# Patient Record
Sex: Female | Born: 1955
Health system: Southern US, Community
[De-identification: ages and names within clinical notes are randomized; demographics above are authoritative.]

## PROBLEM LIST (undated history)

## (undated) DIAGNOSIS — M797 Fibromyalgia: Secondary | ICD-10-CM

## (undated) DIAGNOSIS — F329 Major depressive disorder, single episode, unspecified: Secondary | ICD-10-CM

## (undated) DIAGNOSIS — I10 Essential (primary) hypertension: Secondary | ICD-10-CM

## (undated) DIAGNOSIS — B009 Herpesviral infection, unspecified: Secondary | ICD-10-CM

## (undated) DIAGNOSIS — J189 Pneumonia, unspecified organism: Secondary | ICD-10-CM

## (undated) DIAGNOSIS — L719 Rosacea, unspecified: Secondary | ICD-10-CM

## (undated) DIAGNOSIS — E669 Obesity, unspecified: Secondary | ICD-10-CM

## (undated) DIAGNOSIS — L309 Dermatitis, unspecified: Secondary | ICD-10-CM

## (undated) DIAGNOSIS — M1990 Unspecified osteoarthritis, unspecified site: Secondary | ICD-10-CM

## (undated) DIAGNOSIS — Z8669 Personal history of other diseases of the nervous system and sense organs: Secondary | ICD-10-CM

## (undated) DIAGNOSIS — N3281 Overactive bladder: Secondary | ICD-10-CM

## (undated) DIAGNOSIS — N390 Urinary tract infection, site not specified: Secondary | ICD-10-CM

## (undated) DIAGNOSIS — T7840XA Allergy, unspecified, initial encounter: Secondary | ICD-10-CM

## (undated) DIAGNOSIS — E039 Hypothyroidism, unspecified: Secondary | ICD-10-CM

## (undated) DIAGNOSIS — D496 Neoplasm of unspecified behavior of brain: Secondary | ICD-10-CM

## (undated) DIAGNOSIS — S62102A Fracture of unspecified carpal bone, left wrist, initial encounter for closed fracture: Secondary | ICD-10-CM

## (undated) DIAGNOSIS — K219 Gastro-esophageal reflux disease without esophagitis: Secondary | ICD-10-CM

## (undated) DIAGNOSIS — M8580 Other specified disorders of bone density and structure, unspecified site: Secondary | ICD-10-CM

## (undated) DIAGNOSIS — M722 Plantar fascial fibromatosis: Secondary | ICD-10-CM

## (undated) DIAGNOSIS — M810 Age-related osteoporosis without current pathological fracture: Secondary | ICD-10-CM

## (undated) DIAGNOSIS — H9190 Unspecified hearing loss, unspecified ear: Secondary | ICD-10-CM

## (undated) DIAGNOSIS — B351 Tinea unguium: Secondary | ICD-10-CM

## (undated) DIAGNOSIS — F32A Depression, unspecified: Secondary | ICD-10-CM

## (undated) DIAGNOSIS — D649 Anemia, unspecified: Secondary | ICD-10-CM

## (undated) DIAGNOSIS — G4752 REM sleep behavior disorder: Secondary | ICD-10-CM

## (undated) DIAGNOSIS — F419 Anxiety disorder, unspecified: Secondary | ICD-10-CM

## (undated) DIAGNOSIS — IMO0002 Reserved for concepts with insufficient information to code with codable children: Secondary | ICD-10-CM

## (undated) DIAGNOSIS — K5900 Constipation, unspecified: Secondary | ICD-10-CM

## (undated) HISTORY — DX: Fibromyalgia: M79.7

## (undated) HISTORY — DX: Gastro-esophageal reflux disease without esophagitis: K21.9

## (undated) HISTORY — DX: Personal history of other diseases of the nervous system and sense organs: Z86.69

## (undated) HISTORY — DX: Plantar fascial fibromatosis: M72.2

## (undated) HISTORY — DX: Allergy, unspecified, initial encounter: T78.40XA

## (undated) HISTORY — DX: Age-related osteoporosis without current pathological fracture: M81.0

## (undated) HISTORY — DX: Anemia, unspecified: D64.9

## (undated) HISTORY — DX: Overactive bladder: N32.81

## (undated) HISTORY — PX: TONSILLECTOMY AND ADENOIDECTOMY: SHX28

## (undated) HISTORY — DX: Dermatitis, unspecified: L30.9

## (undated) HISTORY — PX: ABDOMINAL HYSTERECTOMY: SHX81

## (undated) HISTORY — DX: Major depressive disorder, single episode, unspecified: F32.9

## (undated) HISTORY — DX: Unspecified osteoarthritis, unspecified site: M19.90

## (undated) HISTORY — DX: Constipation, unspecified: K59.00

## (undated) HISTORY — DX: Hypothyroidism, unspecified: E03.9

## (undated) HISTORY — DX: Depression, unspecified: F32.A

## (undated) HISTORY — DX: Anxiety disorder, unspecified: F41.9

## (undated) HISTORY — DX: Essential (primary) hypertension: I10

## (undated) HISTORY — DX: Other specified disorders of bone density and structure, unspecified site: M85.80

## (undated) HISTORY — DX: Obesity, unspecified: E66.9

## (undated) HISTORY — DX: Urinary tract infection, site not specified: N39.0

## (undated) HISTORY — DX: Rosacea, unspecified: L71.9

## (undated) HISTORY — DX: REM sleep behavior disorder: G47.52

## (undated) HISTORY — PX: HAND SURGERY: SHX662

## (undated) HISTORY — PX: SPINE SURGERY: SHX786

---

## 2000-04-03 ENCOUNTER — Encounter: Admission: RE | Admit: 2000-04-03 | Discharge: 2000-04-03 | Payer: Self-pay | Admitting: Family Medicine

## 2000-04-03 ENCOUNTER — Encounter: Payer: Self-pay | Admitting: Family Medicine

## 2000-11-26 ENCOUNTER — Other Ambulatory Visit: Admission: RE | Admit: 2000-11-26 | Discharge: 2000-11-26 | Payer: Self-pay | Admitting: Family Medicine

## 2001-11-15 ENCOUNTER — Encounter: Admission: RE | Admit: 2001-11-15 | Discharge: 2001-11-15 | Payer: Self-pay | Admitting: Family Medicine

## 2001-11-15 ENCOUNTER — Encounter: Payer: Self-pay | Admitting: Family Medicine

## 2002-07-21 ENCOUNTER — Other Ambulatory Visit: Admission: RE | Admit: 2002-07-21 | Discharge: 2002-07-21 | Payer: Self-pay | Admitting: Family Medicine

## 2003-04-03 ENCOUNTER — Ambulatory Visit (HOSPITAL_COMMUNITY): Admission: RE | Admit: 2003-04-03 | Discharge: 2003-04-03 | Payer: Self-pay | Admitting: Neurosurgery

## 2004-05-02 ENCOUNTER — Ambulatory Visit (HOSPITAL_COMMUNITY): Admission: RE | Admit: 2004-05-02 | Discharge: 2004-05-02 | Payer: Self-pay | Admitting: Family Medicine

## 2004-08-10 ENCOUNTER — Other Ambulatory Visit: Admission: RE | Admit: 2004-08-10 | Discharge: 2004-08-10 | Payer: Self-pay | Admitting: Family Medicine

## 2006-02-16 ENCOUNTER — Encounter: Admission: RE | Admit: 2006-02-16 | Discharge: 2006-02-16 | Payer: Self-pay | Admitting: Rheumatology

## 2006-02-19 ENCOUNTER — Ambulatory Visit (HOSPITAL_COMMUNITY): Admission: RE | Admit: 2006-02-19 | Discharge: 2006-02-19 | Payer: Self-pay | Admitting: Family Medicine

## 2007-01-29 ENCOUNTER — Encounter: Admission: RE | Admit: 2007-01-29 | Discharge: 2007-01-29 | Payer: Self-pay | Admitting: Neurosurgery

## 2007-02-04 ENCOUNTER — Encounter: Admission: RE | Admit: 2007-02-04 | Discharge: 2007-02-04 | Payer: Self-pay | Admitting: Neurosurgery

## 2007-02-07 ENCOUNTER — Encounter: Admission: RE | Admit: 2007-02-07 | Discharge: 2007-02-07 | Payer: Self-pay | Admitting: Neurosurgery

## 2007-05-30 ENCOUNTER — Ambulatory Visit (HOSPITAL_COMMUNITY): Admission: RE | Admit: 2007-05-30 | Discharge: 2007-05-30 | Payer: Self-pay | Admitting: Family Medicine

## 2007-07-01 ENCOUNTER — Ambulatory Visit (HOSPITAL_COMMUNITY): Admission: RE | Admit: 2007-07-01 | Discharge: 2007-07-01 | Payer: Self-pay | Admitting: Family Medicine

## 2008-02-16 ENCOUNTER — Encounter: Admission: RE | Admit: 2008-02-16 | Discharge: 2008-02-16 | Payer: Self-pay | Admitting: Neurosurgery

## 2008-11-27 ENCOUNTER — Encounter: Admission: RE | Admit: 2008-11-27 | Discharge: 2008-11-27 | Payer: Self-pay | Admitting: Family Medicine

## 2010-05-07 ENCOUNTER — Encounter: Payer: Self-pay | Admitting: Family Medicine

## 2010-05-08 ENCOUNTER — Encounter: Payer: Self-pay | Admitting: Family Medicine

## 2010-05-08 ENCOUNTER — Encounter: Payer: Self-pay | Admitting: Neurosurgery

## 2010-05-09 ENCOUNTER — Encounter: Payer: Self-pay | Admitting: Family Medicine

## 2010-05-21 ENCOUNTER — Encounter: Payer: Self-pay | Admitting: Family Medicine

## 2010-06-27 ENCOUNTER — Other Ambulatory Visit: Payer: Self-pay | Admitting: Family Medicine

## 2010-06-27 DIAGNOSIS — Z1231 Encounter for screening mammogram for malignant neoplasm of breast: Secondary | ICD-10-CM

## 2010-07-01 ENCOUNTER — Ambulatory Visit: Payer: Self-pay

## 2010-07-07 ENCOUNTER — Ambulatory Visit: Payer: Self-pay

## 2010-09-02 NOTE — Op Note (Signed)
NAME:  Emily Steele, Emily Steele                          ACCOUNT NO.:  [id number]   MEDICAL RECORD NO.:  [id number]                   PATIENT TYPE:  OIB   LOCATION:  2899                                 FACILITY:  [facility]   PHYSICIAN:  Donalee Citrin, M.D.                     DATE OF BIRTH:  1955-05-04   DATE OF PROCEDURE:  04/03/2003  DATE OF DISCHARGE:                                 OPERATIVE REPORT   PREOPERATIVE DIAGNOSIS:  Left S1 radiculopathy from ruptured disc L5-S1,  left.   POSTOPERATIVE DIAGNOSIS:  Left S1 radiculopathy from ruptured disc L5-S1,  left.   PROCEDURE:  Lumbar laminectomy and microdiscectomy, L5-S1 left with  microscopic dissection of the left S1 nerve root.   SURGEON:  Donalee Citrin, M.D.   ASSISTANT:  Tia Alert, MD   ANESTHESIA:  General endotracheal anesthesia.   HISTORY OF PRESENT ILLNESS:  The patient is a very pleasant 55 year old  female who has had long-standing back and left leg pain refractory to  conservative treatment that radiates down to the outside of her foot and the  bottom of her foot.  Preoperative imaging showed a preforaminal disc rupture  compressing the left S1 nerve root.  Due to the patient's failure of  conservative treatment, the patient is recommended laminectomy and  microdiscectomy.  I discussed the risks and benefits of the surgery with  her, she understood and agreed to proceed forth.   DESCRIPTION OF PROCEDURE:  The patient was brought to the operating room,  given general anesthesia, positioned prone on the Wilson frame.  The back  was prepped and draped in the usual sterile fashion.  After a preop x-ray  localized the L4 spinous process, a midline incision was made just inferior  to this after infiltration with 10 mL of lidocaine with epinephrine.  Bovie  electrocautery was used to dissect through the subcutaneous tissue and  subperiosteal dissection was carried out at the lamina of L5 and S1 on the  left side.  Interoperative  x-ray confirmed localization of the L5-S1 disc  space.  Using high speed drill, the medial aspect of the spinous process,  inferior aspect of the laminar complex, and medial aspect of the facet  complex, was drilled down and then using Kerrison punch, the inferior aspect  of the lamina of L5 and the superior aspect of the lamina of S1 exposing the  thecal sac and ligamentum flavum.  The ligamentum flavum was removed in a  piecemeal fashion and then the operating microscope was draped and brought  on the field.  The S1 nerve root was identified and the S1 foramen was  opened up with the 3 and 4 mm Kerrison punch.  Then, using a #4 Penfield,  the S1 nerve root was dissected off a bulbous disc fragment that was coming  off the superolateral aspect of the disc space and the S1  nerve root was  reflected medially.  Annulotomy was made and several fragments of disc were  removed that had ruptured but were still contained within the ligament just  overlying the L5 inferior endplate and the lateral compartment of the disc  space.  All these were teased down and the disc was cleaned out.  There was  no further stenosis appreciated on the S1 nerve root as well as the coronary  dilator were able to be passed out the L5 neural foramen.  Using a #4  Penfield, coronary dilator, and a hockey stick, no further stenosis was  appreciated either on the central canal along the thecal sac, the S1 nerve  root along its pathway out the foramen, or the lateral compartment.  Meticulous hemostasis was maintained.  Gelfoam was overlaid on top of the  dura.  The muscle and fascia were reapproximated with 0 interrupted Vicryl,  the  subcutaneous tissue was closed with 2-0 interrupted Vicryl, and skin was  closed with running 4-0 subcuticular.  Benzoin and Steri-Strips were  applied.  The patient went to the recovery room in stable condition.  At the  end of the case, the needle, sponge, and instrument counts were  correct.                                               Donalee Citrin, M.D.    GC/MEDQ  D:  04/03/2003  T:  04/03/2003  Job:  [id number]

## 2010-09-02 NOTE — Op Note (Signed)
NAME:  Emily Steele, Emily Steele                          ACCOUNT NO.:  [id number]   MEDICAL RECORD NO.:  [id number]                   PATIENT TYPE:  OIB   LOCATION:  NA                                   FACILITY:  [facility]   PHYSICIAN:  Donalee Citrin, M.D.                     DATE OF BIRTH:  March 05, 1956   DATE OF PROCEDURE:  04/03/2003  DATE OF DISCHARGE:                                 OPERATIVE REPORT   No dictation for this job number.                                               Donalee Citrin, M.D.    GC/MEDQ  D:  04/03/2003  T:  04/03/2003  Job:  [id number]

## 2011-03-22 ENCOUNTER — Ambulatory Visit (INDEPENDENT_AMBULATORY_CARE_PROVIDER_SITE_OTHER): Payer: PRIVATE HEALTH INSURANCE | Admitting: Family Medicine

## 2011-03-22 DIAGNOSIS — F329 Major depressive disorder, single episode, unspecified: Secondary | ICD-10-CM

## 2011-03-22 DIAGNOSIS — IMO0001 Reserved for inherently not codable concepts without codable children: Secondary | ICD-10-CM

## 2011-03-22 DIAGNOSIS — G894 Chronic pain syndrome: Secondary | ICD-10-CM

## 2011-05-04 ENCOUNTER — Ambulatory Visit: Payer: PRIVATE HEALTH INSURANCE | Admitting: Family Medicine

## 2011-05-05 ENCOUNTER — Ambulatory Visit: Payer: PRIVATE HEALTH INSURANCE | Admitting: Family Medicine

## 2011-05-11 ENCOUNTER — Ambulatory Visit (INDEPENDENT_AMBULATORY_CARE_PROVIDER_SITE_OTHER): Payer: PRIVATE HEALTH INSURANCE | Admitting: Family Medicine

## 2011-05-11 ENCOUNTER — Ambulatory Visit: Payer: PRIVATE HEALTH INSURANCE | Admitting: Family Medicine

## 2011-05-11 DIAGNOSIS — IMO0001 Reserved for inherently not codable concepts without codable children: Secondary | ICD-10-CM

## 2011-05-11 DIAGNOSIS — F341 Dysthymic disorder: Secondary | ICD-10-CM

## 2011-05-17 ENCOUNTER — Encounter: Payer: Self-pay | Admitting: Family Medicine

## 2011-05-17 DIAGNOSIS — M797 Fibromyalgia: Secondary | ICD-10-CM

## 2011-05-17 DIAGNOSIS — F329 Major depressive disorder, single episode, unspecified: Secondary | ICD-10-CM

## 2011-05-17 DIAGNOSIS — F32A Depression, unspecified: Secondary | ICD-10-CM

## 2011-05-17 DIAGNOSIS — M8580 Other specified disorders of bone density and structure, unspecified site: Secondary | ICD-10-CM

## 2011-05-17 DIAGNOSIS — E039 Hypothyroidism, unspecified: Secondary | ICD-10-CM

## 2011-05-17 DIAGNOSIS — E669 Obesity, unspecified: Secondary | ICD-10-CM

## 2011-05-17 DIAGNOSIS — M722 Plantar fascial fibromatosis: Secondary | ICD-10-CM

## 2011-05-17 DIAGNOSIS — G4752 REM sleep behavior disorder: Secondary | ICD-10-CM

## 2011-07-06 ENCOUNTER — Ambulatory Visit: Payer: PRIVATE HEALTH INSURANCE | Admitting: Family Medicine

## 2011-07-20 ENCOUNTER — Ambulatory Visit: Payer: PRIVATE HEALTH INSURANCE | Admitting: Family Medicine

## 2011-07-27 ENCOUNTER — Ambulatory Visit: Payer: PRIVATE HEALTH INSURANCE | Admitting: Family Medicine

## 2011-08-07 ENCOUNTER — Other Ambulatory Visit: Payer: Self-pay | Admitting: Family Medicine

## 2011-08-07 DIAGNOSIS — Z1231 Encounter for screening mammogram for malignant neoplasm of breast: Secondary | ICD-10-CM

## 2011-08-08 ENCOUNTER — Ambulatory Visit (INDEPENDENT_AMBULATORY_CARE_PROVIDER_SITE_OTHER): Payer: PRIVATE HEALTH INSURANCE | Admitting: Family Medicine

## 2011-08-08 ENCOUNTER — Encounter: Payer: Self-pay | Admitting: Family Medicine

## 2011-08-08 VITALS — BP 146/99 | HR 70 | Temp 97.9°F | Resp 16 | Ht 59.5 in | Wt 196.0 lb

## 2011-08-08 DIAGNOSIS — Z8639 Personal history of other endocrine, nutritional and metabolic disease: Secondary | ICD-10-CM

## 2011-08-08 DIAGNOSIS — IMO0002 Reserved for concepts with insufficient information to code with codable children: Secondary | ICD-10-CM

## 2011-08-08 DIAGNOSIS — Z0000 Encounter for general adult medical examination without abnormal findings: Secondary | ICD-10-CM

## 2011-08-08 DIAGNOSIS — E559 Vitamin D deficiency, unspecified: Secondary | ICD-10-CM

## 2011-08-08 DIAGNOSIS — E039 Hypothyroidism, unspecified: Secondary | ICD-10-CM

## 2011-08-08 DIAGNOSIS — Z01419 Encounter for gynecological examination (general) (routine) without abnormal findings: Secondary | ICD-10-CM

## 2011-08-08 LAB — POCT URINALYSIS DIPSTICK
Bilirubin, UA: NEGATIVE
Blood, UA: NEGATIVE
Glucose, UA: NEGATIVE
Ketones, UA: NEGATIVE
Leukocytes, UA: NEGATIVE
Nitrite, UA: NEGATIVE
Protein, UA: NEGATIVE
Spec Grav, UA: >=1.03
Urobilinogen, UA: 0.2
pH, UA: 5.5

## 2011-08-08 LAB — BASIC METABOLIC PANEL WITH GFR
BUN: 10 mg/dL (ref 6–23)
CO2: 25 meq/L (ref 19–32)
Calcium: 9.4 mg/dL (ref 8.4–10.5)
Chloride: 106 meq/L (ref 96–112)
Creat: 0.71 mg/dL (ref 0.50–1.10)
Glucose, Bld: 86 mg/dL (ref 70–99)
Potassium: 4.3 meq/L (ref 3.5–5.3)
Sodium: 140 meq/L (ref 135–145)

## 2011-08-08 LAB — IFOBT (OCCULT BLOOD): IFOBT: NEGATIVE

## 2011-08-08 MED ORDER — CLINDAMYCIN PHOSPHATE 1 % EX GEL: 1.0000 "application " | CUTANEOUS | Status: DC | PRN

## 2011-08-08 MED ORDER — TRETINOIN 0.025 % EX CREA: 45.0000 g | TOPICAL_CREAM | CUTANEOUS | Status: DC | PRN

## 2011-08-08 NOTE — Patient Instructions (Signed)

## 2011-08-10 ENCOUNTER — Telehealth: Payer: Self-pay

## 2011-08-10 LAB — PAP IG (IMAGE GUIDED)

## 2011-08-10 MED ORDER — TRETINOIN 0.05 % EX CREA: TOPICAL_CREAM | Freq: Every day | CUTANEOUS | Status: AC

## 2011-08-10 NOTE — Telephone Encounter (Signed)
Can we change this prescription?

## 2011-08-10 NOTE — Telephone Encounter (Signed)
Pt notified. Dr. Audria Nine, can you please review pt's labs. Thanks

## 2011-08-10 NOTE — Telephone Encounter (Signed)
.  umfc The patient called stating that Dr. Audria Nine had called in the wrong strength of her Retin A- patient is requesting .05% instead of .025%.  Please call patient at [phone]. The patient's preferred pharmacy is [facility] on [address].

## 2011-08-11 NOTE — Progress Notes (Signed)
Quick Note:  Please pt advise that all labs are normal and PAP is negative/normal.  Provide copy of labs and PAP to pt. ______

## 2011-08-12 NOTE — Telephone Encounter (Signed)
I reviewed the medications that I reordered and Retin-A is .05% cream as she requested.

## 2011-08-12 NOTE — Telephone Encounter (Signed)
Called patient, n/a.  Cell number has been disconnected.

## 2011-08-13 ENCOUNTER — Encounter: Payer: Self-pay | Admitting: Family Medicine

## 2011-08-13 DIAGNOSIS — IMO0002 Reserved for concepts with insufficient information to code with codable children: Secondary | ICD-10-CM | POA: Insufficient documentation

## 2011-08-13 DIAGNOSIS — Z8639 Personal history of other endocrine, nutritional and metabolic disease: Secondary | ICD-10-CM | POA: Insufficient documentation

## 2011-08-13 NOTE — Progress Notes (Signed)
Subjective:    Patient ID: Emily Steele, female    DOB: Jun 24, 1955, 56 y.o.   MRN: [id number]  HPI This 56 y.o. Cauc female is here for CPE/PAP. There has been no significant change in health since  last visit; medications are the same without adverse effects. She is single with no children; she is  college- educated but currently not working, having recently be awarded Disability/SSI.   Colonoscopy : ~ 6 years ago  Eye exam: 01/ 2013  Dental exam: 04/2010  Last PAP: 2006 (normal)- S/P TAH  Review of Systems  Constitutional: Positive for unexpected weight change. Negative for fever, chills and appetite change.       Temperature control problems due to thyroid disease  HENT: Negative.   Eyes: Negative.   Respiratory: Negative.   Cardiovascular: Negative.        BP earlier today= 126/82  Gastrointestinal: Positive for constipation. Negative for nausea, diarrhea, blood in stool, anal bleeding and rectal pain.  Genitourinary: Negative.   Musculoskeletal: Positive for back pain and arthralgias. Negative for joint swelling.  Neurological: Positive for tremors. Negative for dizziness, syncope, weakness, numbness and headaches.  Hematological: Negative.   Psychiatric/Behavioral:       Currently sees Psychiatrist       Objective:   Physical Exam  Nursing note and vitals reviewed. Constitutional: She is oriented to person, place, and time. She appears well-developed and well-nourished. No distress.  HENT:  Head: Normocephalic and atraumatic.  Right Ear: External ear normal.  Left Ear: External ear normal.  Nose: Nose normal.  Mouth/Throat: Oropharynx is clear and moist.  Eyes: Conjunctivae and EOM are normal. Pupils are equal, round, and reactive to light. No scleral icterus.  Neck: Normal range of motion. Neck supple. No JVD present. No thyromegaly present.  Cardiovascular: Normal rate, regular rhythm, normal heart sounds and intact distal pulses.  Exam reveals no gallop.   No  murmur heard. Pulmonary/Chest: Effort normal. No respiratory distress. She has no wheezes. She has no rales. Right breast exhibits no inverted nipple, no mass, no nipple discharge, no skin change and no tenderness. Left breast exhibits no inverted nipple, no mass, no nipple discharge, no skin change and no tenderness. Breasts are symmetrical.  Abdominal: Soft. Bowel sounds are normal. She exhibits no distension and no mass. There is tenderness. There is no rebound and no guarding.  Genitourinary: Rectum normal and vagina normal. Rectal exam shows no external hemorrhoid, no fissure, no mass, no tenderness and anal tone normal. Guaiac negative stool. There is no rash, tenderness or lesion on the right labia. There is no rash, tenderness or lesion on the left labia. Right adnexum displays no mass, no tenderness and no fullness. Left adnexum displays no mass, no tenderness and no fullness. No erythema or tenderness around the vagina. No vaginal discharge found.  Musculoskeletal:       Mild decreased ROM in  Joints (hands, wrists, knees and ankles) with mild redness, swelling and tenderness. Decreased flexibility in spine.  Lymphadenopathy:    She has no cervical adenopathy.       Right: No inguinal adenopathy present.       Left: No inguinal adenopathy present.  Neurological: She is alert and oriented to person, place, and time. She has normal reflexes. No cranial nerve deficit. She exhibits normal muscle tone. Coordination normal.  Skin: Skin is warm and dry.  Psychiatric: She has a normal mood and affect. Her behavior is normal. Judgment and thought content normal.  Assessment & Plan:   1. Routine general medical examination at a health care facility   Basic metabolic panel  2. Hypothyroidism  TSH, T4, Free Continue current medication  3. Unspecified vitamin D deficiency  Vitamin D, 25-hydroxy  4. Encounter for cervical Pap smear with pelvic exam  Pap IG (Image Guided)   Refilled  Topical medications used for Rosacea

## 2011-08-14 NOTE — Telephone Encounter (Signed)
LMOM on H# that I was returning her call and that I believe everything has been taken care of. Asked for CB if she still has any ?s/problems

## 2011-08-17 ENCOUNTER — Ambulatory Visit: Payer: Self-pay

## 2011-08-17 ENCOUNTER — Encounter: Payer: Self-pay | Admitting: Family Medicine

## 2011-08-23 ENCOUNTER — Telehealth: Payer: Self-pay

## 2011-08-23 NOTE — Telephone Encounter (Signed)
Pt states dr Audria Nine failed to call her vitamin d into pharmacy   Please call pt at [phone] with explanation

## 2011-08-24 NOTE — Telephone Encounter (Signed)
Level was normal. Patient can take OTC vitamin D

## 2011-08-24 NOTE — Telephone Encounter (Signed)
Pt.notified

## 2011-08-29 ENCOUNTER — Ambulatory Visit: Payer: Self-pay

## 2011-09-05 ENCOUNTER — Telehealth: Payer: Self-pay | Admitting: *Deleted

## 2011-09-05 MED ORDER — CLINDAMYCIN PHOSPHATE 1 % EX SOLN: Freq: Two times a day (BID) | CUTANEOUS | Status: DC

## 2011-09-05 MED ORDER — LEVOTHYROXINE SODIUM 50 MCG PO TABS: 50.0000 ug | ORAL_TABLET | Freq: Every day | ORAL | Status: DC

## 2011-09-05 NOTE — Telephone Encounter (Signed)
Prescriptions sent to pharmacy

## 2011-09-05 NOTE — Telephone Encounter (Signed)
Pt states that she needs the clindamyacin solution and not the gel.  She also needs a refill on her levothyroxine.  Her pharmacy stated that her pharmacy has sent over request.  Can we change the gel to the solution and fill her levothyroxine?  She is almost out of both

## 2011-09-06 NOTE — Telephone Encounter (Signed)
Told pt her rx's have been sent in.

## 2011-09-15 ENCOUNTER — Other Ambulatory Visit: Payer: Self-pay | Admitting: Family Medicine

## 2011-09-15 MED ORDER — RANITIDINE HCL 150 MG PO CAPS: 150.0000 mg | ORAL_CAPSULE | Freq: Two times a day (BID) | ORAL | Status: DC

## 2011-09-28 ENCOUNTER — Ambulatory Visit: Payer: Self-pay

## 2011-12-19 ENCOUNTER — Other Ambulatory Visit: Payer: Self-pay | Admitting: Family Medicine

## 2011-12-25 ENCOUNTER — Telehealth: Payer: Self-pay

## 2011-12-25 MED ORDER — AMPICILLIN 500 MG PO CAPS: 500.0000 mg | ORAL_CAPSULE | Freq: Two times a day (BID) | ORAL | Status: DC

## 2011-12-25 NOTE — Telephone Encounter (Signed)
I will authorize this medication Ampicillin 500 mg  #60 with 1 RF. Please call it to [facility] number provided and let pt know it has been refilled.

## 2011-12-25 NOTE — Telephone Encounter (Signed)
The patient called again regarding her Ampicillan rx.  The patient stated she doesn't understand why her rx was denied and she wants someone to return her call asap at [phone].

## 2011-12-25 NOTE — Telephone Encounter (Signed)
Rx sent in and patient notified.  

## 2011-12-25 NOTE — Telephone Encounter (Signed)
I have called her and she takes this for Rosacea, she has requested Rx and states Dr Audria Nine advised her at office visit she will renew it when needed, had previously gotten from a dermatology clinic. Please advise if you will renew her Ampicillin Rx. She wants it sent to [facility]. She did not indicate which [facility], and our call was disconnected, will you authorize this? When I call her back to advise I will get pharmacy information.

## 2011-12-25 NOTE — Telephone Encounter (Signed)
patient upset this medication was denied, I am unclear why she takes this was entered as historical medication when she was here for office visit with Dr Audria Nine.

## 2011-12-25 NOTE — Telephone Encounter (Signed)
Pt states she is in [place] taking care of mother with cancer and cannot come in for an OV for med refills.  She is out of ampicillin (PRINCIPEN) 500 MG capsule Requesting we refill this script to the [facility] [phone] Uncooperative - demanding Pt is upset that we did not fill this request last Tuesday.  She is completely out of her meds.  [phone]

## 2012-02-29 ENCOUNTER — Ambulatory Visit (INDEPENDENT_AMBULATORY_CARE_PROVIDER_SITE_OTHER): Payer: PRIVATE HEALTH INSURANCE | Admitting: Family Medicine

## 2012-02-29 ENCOUNTER — Encounter: Payer: Self-pay | Admitting: Family Medicine

## 2012-02-29 VITALS — BP 133/86 | HR 74 | Temp 97.9°F | Resp 16 | Ht 60.0 in | Wt 202.0 lb

## 2012-02-29 DIAGNOSIS — Z23 Encounter for immunization: Secondary | ICD-10-CM

## 2012-02-29 DIAGNOSIS — Z760 Encounter for issue of repeat prescription: Secondary | ICD-10-CM

## 2012-02-29 DIAGNOSIS — M1990 Unspecified osteoarthritis, unspecified site: Secondary | ICD-10-CM

## 2012-02-29 DIAGNOSIS — E039 Hypothyroidism, unspecified: Secondary | ICD-10-CM

## 2012-02-29 LAB — CBC WITH DIFFERENTIAL/PLATELET
Basophils Absolute: 0 10*3/uL (ref 0.0–0.1)
Basophils Relative: 1 % (ref 0–1)
Eosinophils Relative: 5 % (ref 0–5)
HCT: 41.5 % (ref 36.0–46.0)
Lymphocytes Relative: 44 % (ref 12–46)
MCHC: 33.3 g/dL (ref 30.0–36.0)
MCV: 81.7 fL (ref 78.0–100.0)
Monocytes Absolute: 0.3 10*3/uL (ref 0.1–1.0)
Platelets: 197 10*3/uL (ref 150–400)
RDW: 14.7 % (ref 11.5–15.5)
WBC: 2.7 10*3/uL — ABNORMAL LOW (ref 4.0–10.5)

## 2012-02-29 MED ORDER — ERGOCALCIFEROL 1.25 MG (50000 UT) PO CAPS: 50000.0000 [IU] | ORAL_CAPSULE | ORAL | Status: DC

## 2012-02-29 MED ORDER — AMPICILLIN 500 MG PO CAPS: 500.0000 mg | ORAL_CAPSULE | Freq: Two times a day (BID) | ORAL | Status: DC

## 2012-02-29 MED ORDER — LEVOTHYROXINE SODIUM 50 MCG PO TABS: 50.0000 ug | ORAL_TABLET | Freq: Every day | ORAL | Status: DC

## 2012-02-29 MED ORDER — RANITIDINE HCL 150 MG PO CAPS: 150.0000 mg | ORAL_CAPSULE | Freq: Two times a day (BID) | ORAL | Status: DC

## 2012-02-29 MED ORDER — CLINDAMYCIN PHOSPHATE 1 % EX SOLN: Freq: Two times a day (BID) | CUTANEOUS | Status: DC

## 2012-02-29 NOTE — Progress Notes (Signed)
S;  This 56 y.o. Cauc female is here for medication refills. She will be going to [place] for several months to care for her terminally ill mother (diagnosed with lung cancer 2 years ago). Pt needs a few months of refills for local pharmacy and 6 months of medications routed to her pharmacy in [place]. She is stable with medical conditions. She will be seeing her psychiatrist for follow-up and med refills as well as a scheduled visit with Dr. Melvenia Needles (Fibromyalgia).  ROS: Noncontributory  O: Filed Vitals:   02/29/12 1023  BP: 133/86  Pulse: 74  Temp: 97.9 F (36.6 C)  Resp: 16   GEN: In NAD; WN,WD. HEENT: [place]/AT; EOMI w/ clear conj/scl; otherwise unremarkable. COR: RRR LUNGS: Normal resp rate and effort. NEURO: A&O x 3; CNs intact. Mentation- Flat affect, otherwise unremarkable.  A/P: 1. Unspecified hypothyroidism  TSH, Comprehensive metabolic panel  2. Degenerative joint disease  CBC with Differential, Comprehensive metabolic panel  3. Issue of repeat prescriptions    4. Need for influenza vaccination  Flu vaccine greater than or equal to 3yo preservative free IM   RX: Zostavax given to pt; she is advised to get vaccinated before going to stay with her mother.  Lab reults need to be faxed to Dr. Melvenia Needles.

## 2012-02-29 NOTE — Patient Instructions (Signed)
You have a RX: Zostavax vaccine; advise the pharmacy that you received the Flu vaccine today. I have refilled requested medications for 6 months at the pharmacy in [place]. We will fax your lab resullts to Dr. Melvenia Needles.

## 2012-03-01 ENCOUNTER — Telehealth: Payer: Self-pay

## 2012-03-01 LAB — COMPREHENSIVE METABOLIC PANEL
ALT: 21 U/L (ref 0–35)
AST: 23 U/L (ref 0–37)
Alkaline Phosphatase: 88 U/L (ref 39–117)
BUN: 14 mg/dL (ref 6–23)
Chloride: 106 mEq/L (ref 96–112)
Creat: 0.69 mg/dL (ref 0.50–1.10)
Total Bilirubin: 0.4 mg/dL (ref 0.3–1.2)

## 2012-03-01 LAB — TSH: TSH: 1.947 u[IU]/mL (ref 0.350–4.500)

## 2012-03-01 NOTE — Telephone Encounter (Signed)
Called patient, labs normal except white counts low, should be discussed with Dr Corliss Skains.

## 2012-03-01 NOTE — Progress Notes (Signed)
Quick Note:  Please call pt and advise that the following labs are abnormal... Labs look good except White blood cell counts are below normal; discuss this with Dr. Corliss Skains ( Fax #: [phone]).  Your results will be faxed to her office.  Copy to pt. ______

## 2012-03-01 NOTE — Telephone Encounter (Signed)
Pt returning lab phone call. [phone]

## 2012-03-25 ENCOUNTER — Telehealth: Payer: Self-pay

## 2012-03-25 NOTE — Telephone Encounter (Signed)
Have pended the Rx. Patient has low white count, do you still want her to have the vaccine, or discuss with Rheumatology? Or d/c this, please let me know. Freddy Kinne

## 2012-03-25 NOTE — Telephone Encounter (Signed)
Advise pt to discuss getting the Shingles vaccine with Rheumatology (Dr. Corliss Skains).

## 2012-03-25 NOTE — Telephone Encounter (Signed)
PT STATES SHE WAS GIVEN THE SHINGLES VACCINE FROM DR [facility] AND HAVE LOST IT. PLEASE CALL [phone]

## 2012-03-26 NOTE — Telephone Encounter (Signed)
Thanks, I have removed the Rx and called patient and she is advised to wait until she has seen the Rheumatologist. She will call back after she has seen Dr Link Snuffer, She is asking about her FMLA forms, have they been completed?

## 2012-04-15 ENCOUNTER — Telehealth: Payer: Self-pay

## 2012-04-15 NOTE — Telephone Encounter (Signed)
Patient also states she has been taking 2/day instead of 1/day as rx'ed.

## 2012-04-15 NOTE — Telephone Encounter (Signed)
Patient is requesting that we call in an rx for lidocane patches. Pt is in [place] and is having severe back pain. [phone] Patient: [phone]

## 2012-04-16 NOTE — Telephone Encounter (Signed)
She states she was originally given these by Dr Wynetta Emery wants to know if we can send these in for her, states today is the last day for her insurance coverage.

## 2012-04-16 NOTE — Telephone Encounter (Signed)
I have called patient and left message for her to call me back. Who originally gave the lidocaine patch? I don't know if it is safe to use 2 patches instead of 1. I have looked at this, and it is safe to use 2 patches, however she can not wear them longer than 12 hours. I will advise when she calls back.

## 2012-04-16 NOTE — Telephone Encounter (Signed)
We have never prescribed these for her.  She needs to get them from Dr. Wynetta Emery, or whoever manages her pain

## 2012-04-17 NOTE — Telephone Encounter (Signed)
LMOM to CB. 

## 2012-04-17 NOTE — Telephone Encounter (Signed)
Advise pt that I do not feel comfortable prescribing Lidoderm patches as we had not discussed my prescribing this medication and I have not fully evaluated back pain; she had been under the care of Dr. Corliss Skains for chronic pain due to fibromyalgia. Who was prescribing this medication if she had not seen Dr. Wynetta Emery since 2006? I fel comfortable with Gabapentin dose increase as well as Skelaxin be resumed if she is not taking that medication.

## 2012-04-17 NOTE — Telephone Encounter (Signed)
Called patient to advise. She has not seen him since 2006 when she had her surgery. She states Dr Audria Nine has stated she will write these now. Please advise if okay (patient has been advised she can not wear these longer than 12 hours. )

## 2012-04-22 NOTE — Telephone Encounter (Signed)
I have called patient to advise.  

## 2012-06-26 ENCOUNTER — Telehealth: Payer: Self-pay

## 2012-06-26 MED ORDER — LEVOTHYROXINE SODIUM 50 MCG PO TABS: 50.0000 ug | ORAL_TABLET | Freq: Every day | ORAL | Status: DC

## 2012-06-26 NOTE — Telephone Encounter (Signed)
Rx sent to pharmacy   

## 2012-06-26 NOTE — Telephone Encounter (Signed)
Primemail request refill on Levothyroxine 50 mcg, 90 day supply.

## 2012-06-28 ENCOUNTER — Other Ambulatory Visit: Payer: Self-pay | Admitting: *Deleted

## 2012-06-28 MED ORDER — RANITIDINE HCL 150 MG PO CAPS: 150.0000 mg | ORAL_CAPSULE | Freq: Two times a day (BID) | ORAL | Status: DC

## 2012-06-28 MED ORDER — AMPICILLIN 500 MG PO CAPS: 500.0000 mg | ORAL_CAPSULE | Freq: Two times a day (BID) | ORAL | Status: DC

## 2012-07-31 ENCOUNTER — Telehealth: Payer: Self-pay

## 2012-07-31 NOTE — Telephone Encounter (Signed)
When do you want her to follow up? °

## 2012-07-31 NOTE — Telephone Encounter (Signed)
PATIENT WANTS TO KNOW WHEN SHE IF SHE IS DUE FOR BLOODWORK,LABS,ETC. PATIENT OF DR MCPHERSON. [phone].

## 2012-08-01 ENCOUNTER — Telehealth: Payer: Self-pay

## 2012-08-01 NOTE — Telephone Encounter (Signed)
Thanks, I have advised her.  

## 2012-08-01 NOTE — Telephone Encounter (Signed)
Patient needs appt with Dr Audria Nine in the next month office visit recheck/ labs

## 2012-08-01 NOTE — Telephone Encounter (Signed)
Pt is calling back because her call got disconnected and she is wanting to know if she needs to come in for blood work.

## 2012-08-01 NOTE — Telephone Encounter (Signed)
Pt probably needs to schedule visit within next month.

## 2012-08-02 NOTE — Telephone Encounter (Signed)
Patient has scheduled appointment with Dr Audria Nine for 4/23

## 2012-08-07 ENCOUNTER — Encounter: Payer: Self-pay | Admitting: Family Medicine

## 2012-08-07 ENCOUNTER — Ambulatory Visit: Payer: PRIVATE HEALTH INSURANCE | Admitting: Family Medicine

## 2012-08-07 ENCOUNTER — Ambulatory Visit (INDEPENDENT_AMBULATORY_CARE_PROVIDER_SITE_OTHER): Payer: BC Managed Care – PPO | Admitting: Family Medicine

## 2012-08-07 VITALS — BP 110/90 | HR 85 | Temp 98.8°F | Resp 16 | Ht 59.0 in | Wt 192.6 lb

## 2012-08-07 DIAGNOSIS — D72819 Decreased white blood cell count, unspecified: Secondary | ICD-10-CM

## 2012-08-07 DIAGNOSIS — Z130 Encounter for screening for diseases of the blood and blood-forming organs and certain disorders involving the immune mechanism: Secondary | ICD-10-CM

## 2012-08-07 DIAGNOSIS — Z13228 Encounter for screening for other metabolic disorders: Secondary | ICD-10-CM

## 2012-08-07 DIAGNOSIS — E039 Hypothyroidism, unspecified: Secondary | ICD-10-CM

## 2012-08-07 LAB — CBC WITH DIFFERENTIAL/PLATELET
Eosinophils Absolute: 0.1 10*3/uL (ref 0.0–0.7)
Hemoglobin: 14.1 g/dL (ref 12.0–15.0)
Lymphocytes Relative: 25 % (ref 12–46)
Lymphs Abs: 0.8 10*3/uL (ref 0.7–4.0)
MCH: 27.4 pg (ref 26.0–34.0)
Monocytes Relative: 12 % (ref 3–12)
Neutro Abs: 1.9 10*3/uL (ref 1.7–7.7)
Neutrophils Relative %: 59 % (ref 43–77)
RBC: 5.15 MIL/uL — ABNORMAL HIGH (ref 3.87–5.11)
WBC: 3.2 10*3/uL — ABNORMAL LOW (ref 4.0–10.5)

## 2012-08-07 LAB — TSH: TSH: 2.264 u[IU]/mL (ref 0.350–4.500)

## 2012-08-07 NOTE — Progress Notes (Signed)
S:  This 57 y.o. Cauc female is here for medication refill; she has hypothyroidism and needs labs checked. She had an emergency room encounter for HA evaluation while in [place] caring for her mother (lung cancer). Etiology of HA thought to be due to sudden withdrawal from Adderall which is prescribed by her local psychiatrist (Dr. Evelene Croon). She was given a 10-day supply of that medication by the treating ED physician until she could return to [facility] and get her refill. Pt takes this medication to offset the sedating effects of other medications.  Otherwise, she is feeling good and is compliant with all other medications. Pt denies fatigue, diaphoresis, CP or tightness, palpitations, edema, SOB or DOE, cough, GI complaints, myalgias, numbness, weakness, tremor or syncope. Sleep hygiene is good.  PMHx, Soc Hx and Fam Hx reviewed.  ROS: As per HPI; otherwise negative.  O: Filed Vitals:   08/07/12 1105  BP: 110/90  Pulse: 85  Temp: 98.8 F (37.1 C)  Resp: 16   GEN: In NAD: WN,WD. HENT: [place]/AT; EOMI w/ clear conj/ sclerae. EACs/nose normal. Otherwise negative. COR: RRR. LUNGS: Normal resp rate and effort. SKIN: W&D. No rashes or pallor. NEURO: A&O x 3; CNs intact. Mentation- affect mildly flat. Otherwise nonfocal.  A/P: Unspecified hypothyroidism -  Continue current medication dose pending labs.  Plan: TSH, T3, Free  Leukopenia -  WBC= 2.7 in Nov 2013 (diff unremarkable)     Plan: CBC with Differential  Screening for endocrine, nutritional, metabolic and immunity disorder - Plan: Vitamin D, 25-hydroxy   Pt will be traveling to care for her mother over next few months; she will call to schedule next visit once mother's health status is stable.

## 2012-08-07 NOTE — Patient Instructions (Addendum)
Vitamin D supplement- get an over-the-counter Vitamin D3 (Nature Made) 2000 IU and try to take it daily.  Contact the office later this year to  Schedule your complete physical; contact us if you have any questions or concerns.

## 2012-08-09 ENCOUNTER — Encounter: Payer: Self-pay | Admitting: Family Medicine

## 2012-08-12 ENCOUNTER — Ambulatory Visit (INDEPENDENT_AMBULATORY_CARE_PROVIDER_SITE_OTHER): Payer: BC Managed Care – PPO | Admitting: Internal Medicine

## 2012-08-12 ENCOUNTER — Encounter: Payer: Self-pay | Admitting: *Deleted

## 2012-08-12 ENCOUNTER — Ambulatory Visit: Payer: BC Managed Care – PPO

## 2012-08-12 VITALS — BP 125/88 | HR 97 | Temp 98.0°F | Resp 18 | Ht 59.0 in | Wt 190.0 lb

## 2012-08-12 DIAGNOSIS — R059 Cough, unspecified: Secondary | ICD-10-CM

## 2012-08-12 DIAGNOSIS — R05 Cough: Secondary | ICD-10-CM

## 2012-08-12 DIAGNOSIS — J029 Acute pharyngitis, unspecified: Secondary | ICD-10-CM

## 2012-08-12 LAB — POCT CBC
Granulocyte percent: 82.8 %G — AB (ref 37–80)
MCH, POC: 27.3 pg (ref 27–31.2)
MCV: 85.5 fL (ref 80–97)
MID (cbc): 0.2 (ref 0–0.9)
MPV: 8.3 fL (ref 0–99.8)
POC LYMPH PERCENT: 14.4 %L (ref 10–50)
POC MID %: 2.8 %M (ref 0–12)
Platelet Count, POC: 189 10*3/uL (ref 142–424)
RBC: 5.1 M/uL (ref 4.04–5.48)
RDW, POC: 15.9 %

## 2012-08-12 LAB — POCT RAPID STREP A (OFFICE): Rapid Strep A Screen: NEGATIVE

## 2012-08-12 MED ORDER — AZITHROMYCIN 500 MG PO TABS: 500.0000 mg | ORAL_TABLET | Freq: Every day | ORAL | Status: DC

## 2012-08-12 MED ORDER — LIDOCAINE VISCOUS 2 % MT SOLN: 20.0000 mL | OROMUCOSAL | Status: DC | PRN

## 2012-08-12 NOTE — Progress Notes (Signed)
Subjective:    Patient ID: Emily Steele, female    DOB: [birth year], 56 y.o.   MRN: [id number]  HPI  Sorethroat onset 3 days ago. Moderate pain with swallowing. But able to swallow liquids. Cough no sputum, No fever but has chills and fatigue. No weight change has had a low wbc count on her last visit with the pmd last week. Nonsmoker no exposure to kids recently. Nonsmoker.  Review of Systems  Constitutional: Positive for chills and fatigue.  HENT: Positive for sore throat.   Respiratory: Positive for cough.   All other systems reviewed and are negative.       Objective:   Physical Exam  Nursing note and vitals reviewed. Constitutional: She is oriented to person, place, and time. She appears well-developed and well-nourished.  HENT:  Head: Normocephalic and atraumatic.  Right Ear: External ear normal.  Left Ear: External ear normal.  Nose: Nose normal.  Pharyngeal erythema no exudates. No abcess or edema  Eyes: Conjunctivae and EOM are normal. Pupils are equal, round, and reactive to light.  Neck: Normal range of motion. Neck supple.  Anterior cervical lymphadenopathy on the right  Cardiovascular: Normal rate, regular rhythm and normal heart sounds.   Pulmonary/Chest: Effort normal. She has no rales.  Coarse rhonchi with scattered wheezes on the right  Abdominal: Soft.  Musculoskeletal: Normal range of motion.  Lymphadenopathy:    She has cervical adenopathy.  Neurological: She is alert and oriented to person, place, and time. She has normal reflexes.  Skin: Skin is warm and dry.  Psychiatric: She has a normal mood and affect. Her behavior is normal. Judgment and thought content normal.      Results for orders placed in visit on 08/12/12  POCT RAPID STREP A (OFFICE)      Result Value Range   Rapid Strep A Screen Negative  Negative  POCT CBC      Result Value Range   WBC 6.0  4.6 - 10.2 K/uL   Lymph, poc 0.9  0.6 - 3.4   POC LYMPH PERCENT 14.4  10 - 50 %L   MID  (cbc) 0.2  0 - 0.9   POC MID % 2.8  0 - 12 %M   POC Granulocyte 5.0  2 - 6.9   Granulocyte percent 82.8 (*) 37 - 80 %G   RBC 5.10  4.04 - 5.48 M/uL   Hemoglobin 13.9  12.2 - 16.2 g/dL   HCT, POC 41.3  24.4 - 47.9 %   MCV 85.5  80 - 97 fL   MCH, POC 27.3  27 - 31.2 pg   MCHC 31.9  31.8 - 35.4 g/dL   RDW, POC 01.0     Platelet Count, POC 189  142 - 424 K/uL   MPV 8.3  0 - 99.8 fL  wbc is normal today and strep is negitive    Results for orders placed in visit on 08/07/12  TSH      Result Value Range   TSH 2.264  0.350 - 4.500 uIU/mL  T3, FREE      Result Value Range   T3, Free 2.9  2.3 - 4.2 pg/mL  CBC WITH DIFFERENTIAL      Result Value Range   WBC 3.2 (*) 4.0 - 10.5 K/uL   RBC 5.15 (*) 3.87 - 5.11 MIL/uL   Hemoglobin 14.1  12.0 - 15.0 g/dL   HCT 27.2  53.6 - 64.4 %   MCV 82.1  78.0 - 100.0  fL   MCH 27.4  26.0 - 34.0 pg   MCHC 33.3  30.0 - 36.0 g/dL   RDW 16.1 (*) 09.6 - 04.5 %   Platelets 229  150 - 400 K/uL   Neutrophils Relative 59  43 - 77 %   Neutro Abs 1.9  1.7 - 7.7 K/uL   Lymphocytes Relative 25  12 - 46 %   Lymphs Abs 0.8  0.7 - 4.0 K/uL   Monocytes Relative 12  3 - 12 %   Monocytes Absolute 0.4  0.1 - 1.0 K/uL   Eosinophils Relative 4  0 - 5 %   Eosinophils Absolute 0.1  0.0 - 0.7 K/uL   Basophils Relative 0  0 - 1 %   Basophils Absolute 0.0  0.0 - 0.1 K/uL   Smear Review Criteria for review not met    VITAMIN D 25 HYDROXY      Result Value Range   Vit D, 25-Hydroxy 28 (*) 30 - 89 ng/mL  [facility] reading (PRIMARY) by  Dr. Glenis Smoker negitive chest xray. No infiltrate.   Assessment & Plan:  Cough sore throat with low wbc one week ago feels fatigue. Will recheck the wbc and a chest xray and strep and treat accordingly.Will treat for Bronchitus and pharyngitis.

## 2012-08-12 NOTE — Patient Instructions (Addendum)
Take antibiotics as directed. Increase fluids. Ibuprofen as directed for pain. You may use the viscous lidocaine for severe throat pain.

## 2012-08-13 ENCOUNTER — Telehealth: Payer: Self-pay | Admitting: Radiology

## 2012-08-13 NOTE — Telephone Encounter (Signed)
Called patient, left message for her to call back about the xray report.

## 2012-08-13 NOTE — Telephone Encounter (Signed)
Message copied by Caffie Damme on Tue Aug 13, 2012  4:09 PM ------      Message from: Dow Adolph B      Created: Tue Aug 13, 2012  4:00 PM       Notify pt that chest xray showed an area that may represent a patch of pneumonia. She needs to have a repeat xray in 6-8 weeks to insure clearing. She should schedule a follow-up appointment also; if her symptoms worsen, she should return immediately.       ------

## 2012-08-14 ENCOUNTER — Telehealth: Payer: Self-pay

## 2012-08-14 NOTE — Telephone Encounter (Signed)
Pt has not been taking any of meds prescribed by Dr. Mindi Junker on 08/12/12 because they went to the mail order service. She needs medications phoned to local pharmacy who can deliver to her. She still has cough, sore throat and poor appetite.  I am phoning medications to [facility] at [phone]. Medications called in: Z-Pak x 1, Tussionex cough med 120 ml  1 tsp every 12 hours, xylocaine 2% solution and Ibuprofen 600 mg 1 tab every 6 hours prn pain. Pt advised again about xray and need to follow-up in 6-8 weeks or sooner if symptoms worsen. She understands.

## 2012-08-14 NOTE — Telephone Encounter (Signed)
Patient is calling to request cough medication with pain meds for her throat.   [facility]  [phone]  CBN:  [phone]

## 2012-08-14 NOTE — Telephone Encounter (Signed)
Will you advise, please. Patient advised of xray results. She has new request today, wants cough meds, pain meds for throat. Was here recently has seen by Dr Glenis Smoker.

## 2012-08-15 DIAGNOSIS — J189 Pneumonia, unspecified organism: Secondary | ICD-10-CM

## 2012-08-15 HISTORY — DX: Pneumonia, unspecified organism: J18.9

## 2012-08-16 ENCOUNTER — Other Ambulatory Visit: Payer: Self-pay | Admitting: Family Medicine

## 2012-08-16 ENCOUNTER — Telehealth: Payer: Self-pay

## 2012-08-16 NOTE — Telephone Encounter (Signed)
Refill not appropriate per Eula Listen however, I am having trouble locating the original rx, do you see record? It is too soon, I will call her, I just want to clarify before I do.

## 2012-08-16 NOTE — Telephone Encounter (Signed)
Pt would like to know why her rx request for Hydrocodone was denied. Best# [phone]

## 2012-08-16 NOTE — Telephone Encounter (Signed)
I don't see an RX for hydrocodone in the EMR.  Perhaps pull her paper record?  Call the pharmacy to find out when it was last written and by whom? Dr. Audria Nine is her PCP, and it looks like she also sees Dr. Corliss Skains, and has DJD.

## 2012-08-17 ENCOUNTER — Inpatient Hospital Stay (HOSPITAL_COMMUNITY)
Admission: EM | Admit: 2012-08-17 | Discharge: 2012-08-23 | DRG: 089 | Disposition: A | Discharge status: Home / Self Care | Payer: BC Managed Care – PPO | Attending: Internal Medicine | Admitting: Internal Medicine

## 2012-08-17 ENCOUNTER — Emergency Department (HOSPITAL_COMMUNITY): Payer: BC Managed Care – PPO

## 2012-08-17 ENCOUNTER — Encounter (HOSPITAL_COMMUNITY): Payer: Self-pay | Admitting: Emergency Medicine

## 2012-08-17 DIAGNOSIS — D320 Benign neoplasm of cerebral meninges: Secondary | ICD-10-CM | POA: Diagnosis present

## 2012-08-17 DIAGNOSIS — Z6836 Body mass index (BMI) 36.0-36.9, adult: Secondary | ICD-10-CM

## 2012-08-17 DIAGNOSIS — M722 Plantar fascial fibromatosis: Secondary | ICD-10-CM

## 2012-08-17 DIAGNOSIS — R4182 Altered mental status, unspecified: Secondary | ICD-10-CM | POA: Diagnosis present

## 2012-08-17 DIAGNOSIS — F32A Depression, unspecified: Secondary | ICD-10-CM | POA: Diagnosis present

## 2012-08-17 DIAGNOSIS — K219 Gastro-esophageal reflux disease without esophagitis: Secondary | ICD-10-CM | POA: Diagnosis present

## 2012-08-17 DIAGNOSIS — IMO0001 Reserved for inherently not codable concepts without codable children: Secondary | ICD-10-CM | POA: Diagnosis present

## 2012-08-17 DIAGNOSIS — Z8639 Personal history of other endocrine, nutritional and metabolic disease: Secondary | ICD-10-CM

## 2012-08-17 DIAGNOSIS — B004 Herpesviral encephalitis: Secondary | ICD-10-CM

## 2012-08-17 DIAGNOSIS — J189 Pneumonia, unspecified organism: Principal | ICD-10-CM | POA: Diagnosis present

## 2012-08-17 DIAGNOSIS — M797 Fibromyalgia: Secondary | ICD-10-CM

## 2012-08-17 DIAGNOSIS — I10 Essential (primary) hypertension: Secondary | ICD-10-CM | POA: Diagnosis present

## 2012-08-17 DIAGNOSIS — G939 Disorder of brain, unspecified: Secondary | ICD-10-CM | POA: Diagnosis present

## 2012-08-17 DIAGNOSIS — F332 Major depressive disorder, recurrent severe without psychotic features: Secondary | ICD-10-CM

## 2012-08-17 DIAGNOSIS — G4752 REM sleep behavior disorder: Secondary | ICD-10-CM

## 2012-08-17 DIAGNOSIS — D72819 Decreased white blood cell count, unspecified: Secondary | ICD-10-CM | POA: Diagnosis present

## 2012-08-17 DIAGNOSIS — E876 Hypokalemia: Secondary | ICD-10-CM | POA: Diagnosis present

## 2012-08-17 DIAGNOSIS — F329 Major depressive disorder, single episode, unspecified: Secondary | ICD-10-CM | POA: Diagnosis present

## 2012-08-17 DIAGNOSIS — M8580 Other specified disorders of bone density and structure, unspecified site: Secondary | ICD-10-CM

## 2012-08-17 DIAGNOSIS — E039 Hypothyroidism, unspecified: Secondary | ICD-10-CM | POA: Diagnosis present

## 2012-08-17 DIAGNOSIS — G9340 Encephalopathy, unspecified: Secondary | ICD-10-CM | POA: Diagnosis present

## 2012-08-17 DIAGNOSIS — E669 Obesity, unspecified: Secondary | ICD-10-CM | POA: Diagnosis present

## 2012-08-17 DIAGNOSIS — F3289 Other specified depressive episodes: Secondary | ICD-10-CM | POA: Diagnosis present

## 2012-08-17 DIAGNOSIS — N390 Urinary tract infection, site not specified: Secondary | ICD-10-CM | POA: Diagnosis present

## 2012-08-17 LAB — URINALYSIS, ROUTINE W REFLEX MICROSCOPIC
Glucose, UA: NEGATIVE mg/dL
Ketones, ur: NEGATIVE mg/dL
Nitrite: NEGATIVE
Protein, ur: 30 mg/dL — AB
Specific Gravity, Urine: 1.03 (ref 1.005–1.030)
Urobilinogen, UA: 1 mg/dL (ref 0.0–1.0)
pH: 6 (ref 5.0–8.0)

## 2012-08-17 LAB — COMPREHENSIVE METABOLIC PANEL
ALT: 27 U/L (ref 0–35)
Alkaline Phosphatase: 76 U/L (ref 39–117)
CO2: 24 mEq/L (ref 19–32)
Chloride: 95 mEq/L — ABNORMAL LOW (ref 96–112)
GFR calc Af Amer: >90 mL/min (ref >90)
Glucose, Bld: 98 mg/dL (ref 70–99)
Potassium: 2.8 mEq/L — ABNORMAL LOW (ref 3.5–5.1)
Sodium: 131 mEq/L — ABNORMAL LOW (ref 135–145)
Total Bilirubin: 0.4 mg/dL (ref 0.3–1.2)
Total Protein: 6.1 g/dL (ref 6.0–8.3)

## 2012-08-17 LAB — URINE MICROSCOPIC-ADD ON

## 2012-08-17 LAB — CSF CELL COUNT WITH DIFFERENTIAL
Lymphs, CSF: 98 % — ABNORMAL HIGH (ref 40–80)
Monocyte-Macrophage-Spinal Fluid: 2 % — ABNORMAL LOW (ref 15–45)
RBC Count, CSF: 7 /mm3 — ABNORMAL HIGH
WBC, CSF: 13 /mm3 (ref 0–5)

## 2012-08-17 LAB — GRAM STAIN: Gram Stain: NONE SEEN

## 2012-08-17 LAB — RAPID URINE DRUG SCREEN, HOSP PERFORMED
Barbiturates: NOT DETECTED
Benzodiazepines: POSITIVE — AB

## 2012-08-17 LAB — TSH: TSH: 1.509 u[IU]/mL (ref 0.350–4.500)

## 2012-08-17 LAB — CBC
HCT: 35.1 % — ABNORMAL LOW (ref 36.0–46.0)
Hemoglobin: 12.2 g/dL (ref 12.0–15.0)
RBC: 4.46 MIL/uL (ref 3.87–5.11)
WBC: 3.4 10*3/uL — ABNORMAL LOW (ref 4.0–10.5)

## 2012-08-17 LAB — T3, FREE: T3, Free: 2.6 pg/mL (ref 2.3–4.2)

## 2012-08-17 MED ORDER — DEXTROSE 5 % IV SOLN
10.0000 mg/kg | Freq: Once | INTRAVENOUS | Status: AC
  Administered 2012-08-17: 860 mg via INTRAVENOUS
  Filled 2012-08-17: qty 17.2

## 2012-08-17 MED ORDER — SODIUM CHLORIDE 0.9 % IJ SOLN: 3.0000 mL | INTRAMUSCULAR | Status: DC | PRN

## 2012-08-17 MED ORDER — GADOBENATE DIMEGLUMINE 529 MG/ML IV SOLN
18.0000 mL | Freq: Once | INTRAVENOUS | Status: AC | PRN
  Administered 2012-08-17: 18 mL via INTRAVENOUS

## 2012-08-17 MED ORDER — DEXTROSE 5 % IV SOLN
500.0000 mg | Freq: Once | INTRAVENOUS | Status: AC
  Administered 2012-08-17: 500 mg via INTRAVENOUS
  Filled 2012-08-17: qty 500

## 2012-08-17 MED ORDER — POTASSIUM CHLORIDE CRYS ER 20 MEQ PO TBCR
40.0000 meq | EXTENDED_RELEASE_TABLET | Freq: Once | ORAL | Status: AC
  Administered 2012-08-17: 40 meq via ORAL
  Filled 2012-08-17: qty 2

## 2012-08-17 MED ORDER — POTASSIUM CHLORIDE 10 MEQ/100ML IV SOLN
10.0000 meq | INTRAVENOUS | Status: AC
  Administered 2012-08-17: 10 meq via INTRAVENOUS
  Filled 2012-08-17: qty 100

## 2012-08-17 MED ORDER — SODIUM CHLORIDE 0.9 % IV BOLUS (SEPSIS)
1000.0000 mL | Freq: Once | INTRAVENOUS | Status: AC
  Administered 2012-08-17: 1000 mL via INTRAVENOUS

## 2012-08-17 MED ORDER — SODIUM CHLORIDE 0.9 % IV SOLN
250.0000 mL | INTRAVENOUS | Status: DC | PRN
  Administered 2012-08-17: 250 mL via INTRAVENOUS

## 2012-08-17 MED ORDER — AZITHROMYCIN 500 MG IV SOLR
500.0000 mg | INTRAVENOUS | Status: DC
  Administered 2012-08-18: 500 mg via INTRAVENOUS
  Filled 2012-08-17 (×2): qty 500

## 2012-08-17 MED ORDER — DULOXETINE HCL 60 MG PO CPEP
60.0000 mg | ORAL_CAPSULE | Freq: Two times a day (BID) | ORAL | Status: DC
  Administered 2012-08-18 – 2012-08-23 (×12): 60 mg via ORAL
  Filled 2012-08-17 (×13): qty 1

## 2012-08-17 MED ORDER — AZITHROMYCIN 500 MG PO TABS: 500.0000 mg | ORAL_TABLET | Freq: Every day | ORAL | Status: DC

## 2012-08-17 MED ORDER — DEXTROSE 5 % IV SOLN
1.0000 g | INTRAVENOUS | Status: DC
  Administered 2012-08-18: 1 g via INTRAVENOUS
  Filled 2012-08-17 (×2): qty 10

## 2012-08-17 MED ORDER — SODIUM CHLORIDE 0.9 % IJ SOLN
3.0000 mL | Freq: Two times a day (BID) | INTRAMUSCULAR | Status: DC
  Administered 2012-08-18 – 2012-08-20 (×3): 3 mL via INTRAVENOUS

## 2012-08-17 MED ORDER — SODIUM CHLORIDE 0.9 % IJ SOLN
3.0000 mL | Freq: Two times a day (BID) | INTRAMUSCULAR | Status: DC
  Administered 2012-08-18 – 2012-08-20 (×5): 3 mL via INTRAVENOUS

## 2012-08-17 MED ORDER — DEXTROSE 5 % IV SOLN
890.0000 mg | Freq: Three times a day (TID) | INTRAVENOUS | Status: AC
  Administered 2012-08-18 (×3): 890 mg via INTRAVENOUS
  Filled 2012-08-17 (×4): qty 17.8

## 2012-08-17 MED ORDER — GABAPENTIN 100 MG PO CAPS
100.0000 mg | ORAL_CAPSULE | Freq: Three times a day (TID) | ORAL | Status: DC
  Administered 2012-08-18 – 2012-08-23 (×17): 100 mg via ORAL
  Filled 2012-08-17 (×19): qty 1

## 2012-08-17 MED ORDER — DEXTROSE 5 % IV SOLN
1.0000 g | Freq: Once | INTRAVENOUS | Status: AC
  Administered 2012-08-17: 1 g via INTRAVENOUS
  Filled 2012-08-17: qty 10

## 2012-08-17 MED ORDER — LEVOTHYROXINE SODIUM 50 MCG PO TABS
50.0000 ug | ORAL_TABLET | Freq: Every day | ORAL | Status: DC
  Administered 2012-08-18 – 2012-08-23 (×6): 50 ug via ORAL
  Filled 2012-08-17 (×9): qty 1

## 2012-08-17 MED ORDER — POTASSIUM CHLORIDE CRYS ER 20 MEQ PO TBCR
40.0000 meq | EXTENDED_RELEASE_TABLET | Freq: Every day | ORAL | Status: DC
  Administered 2012-08-18 – 2012-08-23 (×6): 40 meq via ORAL
  Filled 2012-08-17 (×6): qty 2

## 2012-08-17 MED ORDER — BUPROPION HCL ER (SR) 150 MG PO TB12
150.0000 mg | ORAL_TABLET | Freq: Two times a day (BID) | ORAL | Status: DC
  Administered 2012-08-18 – 2012-08-23 (×12): 150 mg via ORAL
  Filled 2012-08-17 (×13): qty 1

## 2012-08-17 MED ORDER — POTASSIUM CHLORIDE 10 MEQ/100ML IV SOLN
10.0000 meq | INTRAVENOUS | Status: AC
  Administered 2012-08-18 (×3): 10 meq via INTRAVENOUS
  Filled 2012-08-17 (×3): qty 100

## 2012-08-17 NOTE — ED Notes (Signed)
Pt aware of the need for a urine sample. Pt unable to void a this time.

## 2012-08-17 NOTE — H&P (Signed)
[facility] History and Physical  Emily Steele JYN:[id number] DOB: 04-24-1955    PCP:   Dow Adolph, MD   Chief Complaint: Altered mental status.  HPI: Emily Steele is an 57 y.o. female with complex history but at least hypothyroidism, HTN, GERD, depression and fibromyalgia, chronic back pain and bilateral leg pain, brought in by family as she was having decreased alertness and having intermittent confusion over the past month.  She originally from [place], and has moved here about 11 years ago.  She lives alone.  Family noted she has been more confused and intermittently inappropriate.  She had a cough and was placed on codeine cough syrup, but family didn't know if it was relevant.  She was getting more confused and didn't take her medication.  Recently, she was pulled over by the police for not driving properly.  Evaluation in the ER included an LP which showed pleocytosis of 13 WBC, with lymphopredomiant.  Her WBC is 3.2 K, and her K was 2.8 mEq/L with normal renal Fx tests.  Her MRI showed abnormality in the temperal lobe region raises possibility of HSV E, either old or new, along with a possible meningioma.  She was started on IV Acyclovir.  Her CXR also showed infiltrate consistent with PNA, and her UA was indicative of UTI as well with TNTC WBCs.  Hospitalist was asked to admit her for AMS, possible HSV E, CAP, and UTI.  Rewiew of Systems:  Constitutional: Negative for malaise, fever and chills. No significant weight loss or weight gain Eyes: Negative for eye pain, redness and discharge, diplopia, visual changes, or flashes of light. ENMT: Negative for ear pain, hoarseness, nasal congestion, sinus pressure and sore throat. No headaches; tinnitus, drooling, or problem swallowing. Cardiovascular: Negative for chest pain, palpitations, diaphoresis, dyspnea and peripheral edema. ; No orthopnea, PND Respiratory: Negative for cough, hemoptysis, wheezing and stridor. No pleuritic  chestpain. Gastrointestinal: Negative for nausea, vomiting, diarrhea, constipation, abdominal pain, melena, blood in stool, hematemesis, jaundice and rectal bleeding.    Genitourinary: Negative for frequency, dysuria, incontinence,flank pain and hematuria; Musculoskeletal: Negative . Negative for swelling and trauma.; c/o back pain and leg pain Skin: . Negative for pruritus, rash, abrasions, bruising and skin lesion.; ulcerations Neuro: Negative for headache, lightheadedness and neck stiffness. Negative for weakness, altered level of consciousness , altered mental status, extremity weakness, burning feet, involuntary movement, seizure and syncope.  Psych: negative for anxiety, depression, insomnia, tearfulness, panic attacks, hallucinations, paranoia, suicidal or homicidal ideation    Past Medical History  Diagnosis Date  . Depression   . Fibromyalgia   . Hypothyroid   . Sleep behavior disorder, REM   . Osteopenia   . Obesity   . Plantar fasciitis   . Arthritis   . GERD (gastroesophageal reflux disease)   . Anemia   . Hypertension   . Osteoporosis   . Rosacea   . Eczema   . Fibromyalgia   . UTI (urinary tract infection)   . Hx of migraine headaches   . Constipation     Past Surgical History  Procedure Laterality Date  . Tonsillectomy and adenoidectomy    . Abdominal hysterectomy    . Spine surgery      Medications:  HOME MEDS: Prior to Admission medications   Medication Sig Start Date End Date Taking? Authorizing Provider  amphetamine-dextroamphetamine (ADDERALL XR) 30 MG 24 hr capsule Take 30 mg by mouth 3 (three) times daily.    Yes Historical Provider, MD  ampicillin (PRINCIPEN)  500 MG capsule Take 1 capsule (500 mg total) by mouth 2 (two) times daily. 06/28/12  Yes Maurice March, MD  azithromycin (ZITHROMAX) 500 MG tablet Take 1 tablet (500 mg total) by mouth daily. 08/12/12  Yes Sherlyn Hay, MD  buPROPion [facility] SR) 150 MG 12 hr tablet Take 150 mg by  mouth 2 (two) times daily.   Yes Historical Provider, MD  DULoxetine (CYMBALTA) 60 MG capsule Take 60 mg by mouth 2 (two) times daily. 03/13/11  Yes Historical Provider, MD  gabapentin (NEURONTIN) 100 MG capsule Take 100 mg by mouth 3 (three) times daily.   Yes Historical Provider, MD  levothyroxine (SYNTHROID, LEVOTHROID) 50 MCG tablet Take 1 tablet (50 mcg total) by mouth daily. 06/26/12  Yes Heather M Marte, PA-C  lidocaine (LIDODERM) 5 % Place 1 patch onto the skin as needed. Remove & Discard patch within 12 hours or as directed by MD   Yes Historical Provider, MD  lidocaine (XYLOCAINE) 2 % solution Take 20 mLs by mouth as needed for pain. 08/12/12  Yes Sherlyn Hay, MD  metaxalone (SKELAXIN) 800 MG tablet Take 800 mg by mouth 2 (two) times daily.   Yes Historical Provider, MD  ranitidine (ZANTAC) 150 MG capsule Take 1 capsule (150 mg total) by mouth 2 (two) times daily. 06/28/12  Yes Maurice March, MD  temazepam (RESTORIL) 15 MG capsule Take 15 mg by mouth daily.   Yes Historical Provider, MD     Allergies:  Allergies  Allergen Reactions  . Doxycycline Other (See Comments)    headaches  . Sulfa Antibiotics     Headaches  . Methocarbamol Nausea Only    headaches    Social History:   reports that she has never smoked. She does not have any smokeless tobacco history on file. She reports that she does not drink alcohol or use illicit drugs.  Family History: Family History  Problem Relation Age of Onset  . Cancer Mother   . Hypertension Mother   . Cancer Sister   . Heart disease Sister   . Asthma Sister   . Asthma Brother   . Drug abuse Brother   . Hypertension Brother   . Drug abuse Daughter      Physical Exam: Filed Vitals:   08/17/12 1749 08/17/12 1830 08/17/12 2055 08/17/12 2130  BP: 124/79 135/53  109/67  Pulse: 74 74 73 92  Temp:      TempSrc:      Resp: 16     Height:   5\' 1"  (1.549 m)   Weight:   88.451 kg (195 lb)   SpO2: 100% 99% 95% 87%   Blood  pressure 109/67, pulse 92, temperature 97.7 F (36.5 C), temperature source Rectal, resp. rate 16, height 5\' 1"  (1.549 m), weight 88.451 kg (195 lb), SpO2 87.00%.  GEN:  Pleasant  patient lying in the stretcher in no acute distress; cooperative with exam. PSYCH:  alert and oriented x4; does not appear anxious or depressed; affect is appropriate. HEENT: Mucous membranes pink and anicteric; PERRLA; EOM intact; no cervical lymphadenopathy nor thyromegaly or carotid bruit; no JVD; There were no stridor. Neck is very supple. Breasts:: Not examined CHEST WALL: No tenderness CHEST: Normal respiration, clear to auscultation bilaterally.  HEART: Regular rate and rhythm.  There are no murmur, rub, or gallops.   BACK: No kyphosis or scoliosis; no CVA tenderness ABDOMEN: soft and non-tender; no masses, no organomegaly, normal abdominal bowel sounds; no pannus; no intertriginous candida.  There is no rebound and no distention. Rectal Exam: Not done EXTREMITIES: No bone or joint deformity; age-appropriate arthropathy of the hands and knees; no edema; no ulcerations.  There is no calf tenderness. Genitalia: not examined PULSES: 2+ and symmetric SKIN: Normal hydration no rash or ulceration CNS: Cranial nerves 2-12 grossly intact no focal lateralizing neurologic deficit.  Speech is fluent; uvula elevated with phonation, facial symmetry and tongue midline. DTR are normal bilaterally, cerebella exam is intact, barbinski is negative and strengths are equaled bilaterally.  No sensory loss.   Labs on Admission:  Basic Metabolic Panel:  Recent Labs Lab 08/17/12 1341  NA 131*  K 2.8*  CL 95*  CO2 24  GLUCOSE 98  BUN 13  CREATININE 0.69  CALCIUM 8.8   Liver Function Tests:  Recent Labs Lab 08/17/12 1341  AST 43*  ALT 27  ALKPHOS 76  BILITOT 0.4  PROT 6.1  ALBUMIN 3.0*   No results found for this basename: LIPASE, AMYLASE,  in the last 168 hours No results found for this basename: AMMONIA,  in  the last 168 hours CBC:  Recent Labs Lab 08/12/12 2126 08/17/12 1341  WBC 6.0 3.4*  HGB 13.9 12.2  HCT 43.6 35.1*  MCV 85.5 78.7  PLT  --  208   Cardiac Enzymes: No results found for this basename: CKTOTAL, CKMB, CKMBINDEX, TROPONINI,  in the last 168 hours  CBG: No results found for this basename: GLUCAP,  in the last 168 hours   Radiological Exams on Admission: Dg Chest 2 View  08/17/2012  *RADIOLOGY REPORT*  Clinical Data: Confusion.  Cough.  CHEST - 2 VIEW  Comparison: None.  Findings: There are low lung volumes.  The heart size and mediastinal contours are normal.  There is patchy airspace disease medially at the right lung base, best seen on the frontal examination.  The left lung is clear.  There is no pleural effusion or pneumothorax.  Telemetry leads overlie the chest.  Degenerative changes are present throughout the thoracic spine.  IMPRESSION: Patchy right basilar air space, suspicious for possible aspiration in this clinical context.   Original Report Authenticated By: Carey Bullocks, M.D.    Ct Head Wo Contrast  08/17/2012  *RADIOLOGY REPORT*  Clinical Data: Altered mental status with speech changes.  CT HEAD WITHOUT CONTRAST  Technique:  Contiguous axial images were obtained from the base of the skull through the vertex without contrast.  Comparison: None.  Findings: There is a low density throughout the right temporal lobe with volume loss and prominence of the overlying sulci.  The left temporal lobe appears normal.  There is no evidence of acute intracranial hemorrhage, focal extra-axial fluid collection or hydrocephalus. Some relatively high density is noted superior to the right petrous apex along the posterior aspect of this temporal lobe low density.  This likely represents volume averaging with adjacent normal brain tissue, although could indicate a meningioma.  Mucosal thickening is present throughout the ethmoid sinuses. There are air-fluid levels in the maxillary and  sphenoid sinuses. The mastoids and middle ears are clear.  The calvarium is intact.  IMPRESSION:  1.  Abnormal process in the right temporal lobe has a chronic component manifesting as volume loss and may be the sequela of prior infarction or trauma.  However, an acute superimposed process such as herpes encephalitis cannot be completely excluded in this clinical context. 2.  Possible meningioma over the right petrous apex. 3.  No evidence of acute intracranial hemorrhage or positive  mass effect. 4.  Sinusitis.  MRI of the brain without and with contrast is recommended for further evaluation. These results were called by telephone on 08/17/2012 at 1545 hours to Dr. Rosalia Hammers, who verbally acknowledged these results.   Original Report Authenticated By: Carey Bullocks, M.D.    Mr Laqueta Jean Wo Contrast  08/17/2012  *RADIOLOGY REPORT*  Clinical Data: Altered mental status.  MRI HEAD WITHOUT AND WITH CONTRAST  Technique:  Multiplanar, multiecho pulse sequences of the brain and surrounding structures were obtained according to standard protocol without and with intravenous contrast  Contrast: 18mL MULTIHANCE GADOBENATE DIMEGLUMINE 529 MG/ML IV SOLN  Comparison: 08/17/2012 CT.  Findings: As best appreciated on FLAIR sequence (series 6) is abnormal signal of the right temporal lobe (including involvement of the right hippocampus and uncus).  Abnormal signal also noted in the sub insular region bilaterally as well as the medial subfrontal region.  This distribution of changes raises possibility of herpes encephalitis.  As there is atrophy of the anterior right temporal lobe, it is possible the patient had herpes in the past and what is noted on the current exam represents sequelae of prior infection however, active infection cannot be excluded by MR imaging. No restricted motion as can be seen with acute herpes.  Confounding factor is that there is a 1.3 x 1.5 x 1.6 cm enhancing mass lateral aspect of the mid temporal region. It  is difficult to state with certainty that this is extra-axial in location as may be expected with a meningioma.  High resolution thin section T2- weighted imaging would prove helpful to determine if this is extra- axial and exclude possibility of this representing an intra-axial lesion.  No acute infarct.  No intracranial hemorrhage.  No hydrocephalus.  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  Major intracranial vascular structures are patent.  Degenerative changes cervical spine most notable C5-6 with slight cord flattening.  Paranasal sinus mucosal thickening / opacification most notable involving the ethmoid sinus air cells, left sphenoid sinus air cell and maxillary sinuses where there are air fluid levels which may indicate acute sinusitis.  IMPRESSION: Abnormal MRI scan may represent combination of result of prior herpes encephalitis and incidentally detected right temporal region meningioma. Active herpes encephalitis cannot be entirely excluded by MR imaging.   High resolution T2-weighted imaging through the right temporal lobe mass would prove helpful to determine if this is extra-axial in location.  Please see above discussion.  Paranasal sinus opacification with air fluid levels raising the possibility of acute sinusitis.  Critical Value/emergent results were called by telephone at the time of interpretation on 08/17/2012 at 5:40 p.m. to Dr. Silverio Lay, who verbally acknowledged these results.   Original Report Authenticated By: Lacy Duverney, M.D.     Assessment/Plan Present on Admission:  . Altered mental status . Herpetic encephalitis . Fibromyalgia . Major Depressive Disorder . Hypothyroidism . Obesity . CAP (community acquired pneumonia)  PLAN: Although clinically she doesn't seem to have HSVE, with her pleocytosis, and the general specificity of the MRI, we ll have to treat her with IV Acyclovir pending HSV PCR.  It is also possible that her pleocytosis is from  demyelinating disease (as suggested by neurology).  Psychiatric diagnosis comfounding the clinical picture in this patient.  Furthermore, her declined maybe from contribution infection in both PNA and UTI.  I have started her on IV Rocephin and IV Zithromax for both.  With respect to further work up, we ll obtain HIV, TSH, along with  RPR and B12.  I will obtain L[place] titer also since [place] was from the [place].  Her CSF will be cultured.  CSF glucose and protein was unremarkable.  Her low K will be supplemented appropriately.  She is stable, full code, and will be admitted to [facility] service.  Thank you for allowing me to partake in the care of this nice patient.  Other plans as per orders.  Code Status: FULL Unk Lightning, MD. [facility] Pager [phone] 7pm to 7am.  08/17/2012, 11:10 PM

## 2012-08-17 NOTE — ED Notes (Addendum)
Patient transported to X-ray 

## 2012-08-17 NOTE — ED Notes (Addendum)
Pt's brother brought pt in today stating that she is no longer taking her psych meds and is unable to care for herself at home.  Pt either has not been eating or has been eating moldy food.  States that she has been talking about people who are dead.  Tangential speech.  Pt's dog is not being cared for and is emaciated.  Pt is confused.  Pt is oriented to self, but not to time and place.  All of pt's family lives elsewhere and cannot take care of her.  States that she was dx w/ pneumonia recently but unsure if this is true.

## 2012-08-17 NOTE — ED Notes (Signed)
Patient transported to CT 

## 2012-08-17 NOTE — ED Provider Notes (Signed)
Medical screening examination/treatment/procedure(s) were conducted as a shared visit with non-physician practitioner(s) and myself.  I personally evaluated the patient during the encounter  Emily Steele is a 57 y.o. female here with AMS. More confused over the last month, worse in the last week. Unable to give much history. A&O x 1 on exam, nonfocal neuro exam. CT showed possible herpes encephalitis. MRI ordered and confirmed the encephalitis. She also has UTI and possible pneumonia on xray. She was given ceftriaxone, azithromycin, and acyclovir. I called Dr. Roseanne Reno from neurologist, who recommend LP. LP performed and there is 13 WBC with lymphocytic predominance. See separate procedure note. She is admitted to medicine for presumed herpes infection, pneumonia, and UTI causing her altered mental status.      CRITICAL CARE Performed by: Silverio Lay, DAVID   Total critical care time: 30 min   Critical care time was exclusive of separately billable procedures and treating other patients.  Critical care was necessary to treat or prevent imminent or life-threatening deterioration.  Critical care was time spent personally by me on the following activities: development of treatment plan with patient and/or surrogate as well as nursing, discussions with consultants, evaluation of patient's response to treatment, examination of patient, obtaining history from patient or surrogate, ordering and performing treatments and interventions, ordering and review of laboratory studies, ordering and review of radiographic studies, pulse oximetry and re-evaluation of patient's condition.    Richardean Canal, MD 08/18/12 1452

## 2012-08-17 NOTE — ED Notes (Signed)
Patient transported to MRI 

## 2012-08-17 NOTE — ED Notes (Signed)
MD at bedside. 

## 2012-08-17 NOTE — Consult Note (Signed)
Reason for Consult: Altered mental status Referring Physician: Houston Siren  CC: Altered mental status  History is obtained from: Brother and sister-in-law  HPI: Emily Steele is a 57 y.o. female who has been increasingly confused over the past couple of weeks. The brother has seen her only intermittently over the past year do to her being in [place] taking care of their mother who was sick.  Apparently, in late February or early March she had an episode where she was complaining of headache and became very fatigued spending several days in her room. At that time, she was taken to the emergency room by family member, but checked out AMA prior to her diagnosis being given. It is unclear to me if she was febrile at that time.  She got back to [place] on April 13, and the following weekend she was maybe exhibiting some mild confusion. She got lost in an area where she should have known where she was going, and seemed to have some problems with her memory. More recently this has become very pronounced and she is having difficulty with organizing her medications. Apparently since March, she is only sporadically been able to take her medications as prescribed.   ROS: A 14 point ROS was performed and is negative except as noted in the HPI.  Past Medical History  Diagnosis Date  . Depression   . Fibromyalgia   . Hypothyroid   . Sleep behavior disorder, REM   . Osteopenia   . Obesity   . Plantar fasciitis   . Arthritis   . GERD (gastroesophageal reflux disease)   . Anemia   . Hypertension   . Osteoporosis   . Rosacea   . Eczema   . Fibromyalgia   . UTI (urinary tract infection)   . Hx of migraine headaches   . Constipation     Family History:  no history of similar, no history of tremor  Social History: Tob: Denies Lives alone  Exam: Current vital signs: BP 109/67  Pulse 92  Temp(Src) 97.7 F (36.5 C) (Rectal)  Resp 16  Ht 5\' 1"  (1.549 m)  Wt 88.451 kg (195 lb)  BMI  36.86 kg/m2  SpO2 87% Vital signs in last 24 hours: Temp:  [97.7 F (36.5 C)-98.8 F (37.1 C)] 97.7 F (36.5 C) (05/03 1509) Pulse Rate:  [73-92] 92 (05/03 2130) Resp:  [16] 16 (05/03 1749) BP: (109-135)/(53-79) 109/67 mmHg (05/03 2130) SpO2:  [87 %-100 %] 87 % (05/03 2130) Weight:  [88.451 kg (195 lb)] 88.451 kg (195 lb) (05/03 2055)  General: In bed, NAD CV: Regular rate and rhythm Mental Status: Patient is awake, alert, oriented to person,  month, year. She thinks that she is in [facility].  She has recalled 1/3 after having the words presented, performing serial sevens, and then the words asked of her. She does not recall having a lumbar puncture earlier. She does not recall multiple recent events. Cranial Nerves: II: Visual Fields are full. Pupils are equal, round, and reactive to light.  Discs are difficult to visualize. III,IV, VI: EOMI without ptosis or diploplia.  V: Facial sensation is symmetric to temperature VII: Facial movement is symmetric.  VIII: hearing is intact to voice X: Uvula elevates symmetrically XI: Shoulder shrug is symmetric. XII: tongue is midline without atrophy or fasciculations.  Motor: Tone is normal. Bulk is normal. 5/5 strength was present in all four extremities.  Sensory: Sensation is symmetric to light touch and temperature in the arms  and legs. Deep Tendon Reflexes: 2+ and symmetric in the biceps and patellae.  Cerebellar: FNF with significant tremor with both postural and intentional component bilaterally, though slightly worse on left Gait: Not tested as patient just had lumbar puncture   I have reviewed labs in epic and the results pertinent to this consultation are: CSF with lymphocytic pleocytosis of 13  I have reviewed the images obtained: MRI brain-she is significant white matter change more prominent on the right than left. She also has some encephalomalacia changes of the right anterior temporal  lobe  Impression: 57 year old female with worsening mental status in the setting of pneumonia and urinary tract infection. It is possible that she also has activation of latent herpes virus. Also possible would be a previous episode of herpes encephalitis (likely February or early March of this year) with current symptoms being do to physiological stressors.  Recommendations: 1) HSV by PCR in the CSF 2) would treat empirically with acyclovir until this result has returned 3) would consider infectious disease consult 4) VDRL and an Ace on the CSF 5) check HIV (leukopenia and can also cause mild pleocytosis) 6) treatment of pneumonia and urinary tract infection per internal medicine 7) thin slices as recommended by radiology for further evaluation of her mass   Ritta Slot, MD [facility] [phone]  If 7pm- 7am, please page neurology on call at [phone].

## 2012-08-17 NOTE — ED Provider Notes (Signed)
History     CSN: [id number]  Arrival date & time 08/17/12  1256   First MD Initiated Contact with Patient 08/17/12 1423      Chief Complaint  Patient presents with  . Medical Clearance  . Failure To Thrive    (Consider location/radiation/quality/duration/timing/severity/associated sxs/prior treatment) HPI Pt with hx of depression and fibromyalgia BIB brother and sister-in-law for AMS.  Pt is A&Ox1, family are poor historians.  They state pt has been depressed since family issues that started at the end of last year, including mother being dx with cancer in Dec.  Pt was living with mother in [place] in Feb but brought back to [place] to live on her own because brother in [place] could not take care of depressed pt and sick mother. Bother who brought her in today states pt had erratic speech last week but worsened today.  Were called by mother who stated she was concerned and wanted pt to be brought to the hospital.    Past Medical History  Diagnosis Date  . Depression   . Fibromyalgia   . Hypothyroid   . Sleep behavior disorder, REM   . Osteopenia   . Obesity   . Plantar fasciitis   . Arthritis   . GERD (gastroesophageal reflux disease)   . Anemia   . Hypertension   . Osteoporosis   . Rosacea   . Eczema   . Fibromyalgia   . UTI (urinary tract infection)   . Hx of migraine headaches   . Constipation     Past Surgical History  Procedure Laterality Date  . Tonsillectomy and adenoidectomy    . Abdominal hysterectomy    . Spine surgery      Family History  Problem Relation Age of Onset  . Cancer Mother   . Hypertension Mother   . Cancer Sister   . Heart disease Sister   . Asthma Sister   . Asthma Brother   . Drug abuse Brother   . Hypertension Brother   . Drug abuse Daughter     History  Substance Use Topics  . Smoking status: Never Smoker   . Smokeless tobacco: Not on file  . Alcohol Use: No    OB History   Grav Para Term Preterm Abortions TAB SAB  Ect Mult Living                  Review of Systems  Unable to perform ROS: Mental status change    Allergies  Doxycycline; Sulfa antibiotics; and Methocarbamol  Home Medications   Current Outpatient Rx  Name  Route  Sig  Dispense  Refill  . amphetamine-dextroamphetamine (ADDERALL XR) 30 MG 24 hr capsule   Oral   Take 30 mg by mouth 3 (three) times daily.          Marland Kitchen ampicillin (PRINCIPEN) 500 MG capsule   Oral   Take 1 capsule (500 mg total) by mouth 2 (two) times daily.   180 capsule   0   . azithromycin (ZITHROMAX) 500 MG tablet   Oral   Take 1 tablet (500 mg total) by mouth daily.   5 tablet   0   . buPROPion (WELLBUTRIN SR) 150 MG 12 hr tablet   Oral   Take 150 mg by mouth 2 (two) times daily.         . DULoxetine (CYMBALTA) 60 MG capsule   Oral   Take 60 mg by mouth 2 (two) times daily.         Marland Kitchen  gabapentin (NEURONTIN) 100 MG capsule   Oral   Take 100 mg by mouth 3 (three) times daily.         Marland Kitchen levothyroxine (SYNTHROID, LEVOTHROID) 50 MCG tablet   Oral   Take 1 tablet (50 mcg total) by mouth daily.   90 tablet   1   . lidocaine (LIDODERM) 5 %   Transdermal   Place 1 patch onto the skin as needed. Remove & Discard patch within 12 hours or as directed by MD         . lidocaine (XYLOCAINE) 2 % solution   Oral   Take 20 mLs by mouth as needed for pain.   100 mL   0   . metaxalone (SKELAXIN) 800 MG tablet   Oral   Take 800 mg by mouth 2 (two) times daily.         . ranitidine (ZANTAC) 150 MG capsule   Oral   Take 1 capsule (150 mg total) by mouth 2 (two) times daily.   180 capsule   0   . temazepam (RESTORIL) 15 MG capsule   Oral   Take 15 mg by mouth daily.           BP 135/53  Pulse 74  Temp(Src) 97.7 F (36.5 C) (Rectal)  Resp 16  SpO2 99%  Physical Exam  Constitutional: She appears lethargic. She has a sickly appearance. No distress.  Pt appears older than stated age. Frail appearing.  Severely chapped lips.    HENT:  Head: Normocephalic and atraumatic.  Eyes: Conjunctivae are normal. No scleral icterus.  Neck: Normal range of motion. Neck supple.  Cardiovascular: Normal rate, regular rhythm and normal heart sounds.   Pulmonary/Chest: Effort normal. No respiratory distress. She has wheezes ( lower lobs). She has no rales. She exhibits no tenderness.  Abdominal: Soft. Bowel sounds are normal. She exhibits no distension and no mass. There is no tenderness. There is no rebound and no guarding.  Musculoskeletal: Normal range of motion.  Neurological: She appears lethargic. She is disoriented. No cranial nerve deficit.  Pt oriented to self only.   Skin: Skin is warm and dry. She is not diaphoretic.  Psychiatric: Her speech is delayed. She is slowed. Cognition and memory are impaired. She exhibits a depressed mood.       ED Course  Procedures (including critical care time)  Labs Reviewed  CBC - Abnormal; Notable for the following:    WBC 3.4 (*)    HCT 35.1 (*)    All other components within normal limits  COMPREHENSIVE METABOLIC PANEL - Abnormal; Notable for the following:    Sodium 131 (*)    Potassium 2.8 (*)    Chloride 95 (*)    Albumin 3.0 (*)    AST 43 (*)    All other components within normal limits  URINE RAPID DRUG SCREEN (HOSP PERFORMED) - Abnormal; Notable for the following:    Opiates POSITIVE (*)    Benzodiazepines POSITIVE (*)    Amphetamines POSITIVE (*)    All other components within normal limits  URINALYSIS, ROUTINE W REFLEX MICROSCOPIC - Abnormal; Notable for the following:    Color, Urine AMBER (*)    APPearance TURBID (*)    Hgb urine dipstick SMALL (*)    Bilirubin Urine SMALL (*)    Protein, ur 30 (*)    Leukocytes, UA LARGE (*)    All other components within normal limits  URINE MICROSCOPIC-ADD ON - Abnormal; Notable for  the following:    Bacteria, UA MANY (*)    All other components within normal limits  URINE CULTURE  CSF CULTURE  GRAM STAIN  ETHANOL   GLUCOSE, CSF  PROTEIN, CSF  TSH  T3, FREE  CSF CELL COUNT WITH DIFFERENTIAL  HERPES SIMPLEX VIRUS(HSV) DNA BY PCR   Dg Chest 2 View  08/17/2012  *RADIOLOGY REPORT*  Clinical Data: Confusion.  Cough.  CHEST - 2 VIEW  Comparison: None.  Findings: There are low lung volumes.  The heart size and mediastinal contours are normal.  There is patchy airspace disease medially at the right lung base, best seen on the frontal examination.  The left lung is clear.  There is no pleural effusion or pneumothorax.  Telemetry leads overlie the chest.  Degenerative changes are present throughout the thoracic spine.  IMPRESSION: Patchy right basilar air space, suspicious for possible aspiration in this clinical context.   Original Report Authenticated By: Carey Bullocks, M.D.    Ct Head Wo Contrast  08/17/2012  *RADIOLOGY REPORT*  Clinical Data: Altered mental status with speech changes.  CT HEAD WITHOUT CONTRAST  Technique:  Contiguous axial images were obtained from the base of the skull through the vertex without contrast.  Comparison: None.  Findings: There is a low density throughout the right temporal lobe with volume loss and prominence of the overlying sulci.  The left temporal lobe appears normal.  There is no evidence of acute intracranial hemorrhage, focal extra-axial fluid collection or hydrocephalus. Some relatively high density is noted superior to the right petrous apex along the posterior aspect of this temporal lobe low density.  This likely represents volume averaging with adjacent normal brain tissue, although could indicate a meningioma.  Mucosal thickening is present throughout the ethmoid sinuses. There are air-fluid levels in the maxillary and sphenoid sinuses. The mastoids and middle ears are clear.  The calvarium is intact.  IMPRESSION:  1.  Abnormal process in the right temporal lobe has a chronic component manifesting as volume loss and may be the sequela of prior infarction or trauma.  However, an  acute superimposed process such as herpes encephalitis cannot be completely excluded in this clinical context. 2.  Possible meningioma over the right petrous apex. 3.  No evidence of acute intracranial hemorrhage or positive mass effect. 4.  Sinusitis.  MRI of the brain without and with contrast is recommended for further evaluation. These results were called by telephone on 08/17/2012 at 1545 hours to Dr. Rosalia Hammers, who verbally acknowledged these results.   Original Report Authenticated By: Carey Bullocks, M.D.    Mr Laqueta Jean Wo Contrast  08/17/2012  *RADIOLOGY REPORT*  Clinical Data: Altered mental status.  MRI HEAD WITHOUT AND WITH CONTRAST  Technique:  Multiplanar, multiecho pulse sequences of the brain and surrounding structures were obtained according to standard protocol without and with intravenous contrast  Contrast: 18mL MULTIHANCE GADOBENATE DIMEGLUMINE 529 MG/ML IV SOLN  Comparison: 08/17/2012 CT.  Findings: As best appreciated on FLAIR sequence (series 6) is abnormal signal of the right temporal lobe (including involvement of the right hippocampus and uncus).  Abnormal signal also noted in the sub insular region bilaterally as well as the medial subfrontal region.  This distribution of changes raises possibility of herpes encephalitis.  As there is atrophy of the anterior right temporal lobe, it is possible the patient had herpes in the past and what is noted on the current exam represents sequelae of prior infection however, active infection cannot be excluded by  MR imaging. No restricted motion as can be seen with acute herpes.  Confounding factor is that there is a 1.3 x 1.5 x 1.6 cm enhancing mass lateral aspect of the mid temporal region. It is difficult to state with certainty that this is extra-axial in location as may be expected with a meningioma.  High resolution thin section T2- weighted imaging would prove helpful to determine if this is extra- axial and exclude possibility of this representing  an intra-axial lesion.  No acute infarct.  No intracranial hemorrhage.  No hydrocephalus.  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  Major intracranial vascular structures are patent.  Degenerative changes cervical spine most notable C5-6 with slight cord flattening.  Paranasal sinus mucosal thickening / opacification most notable involving the ethmoid sinus air cells, left sphenoid sinus air cell and maxillary sinuses where there are air fluid levels which may indicate acute sinusitis.  IMPRESSION: Abnormal MRI scan may represent combination of result of prior herpes encephalitis and incidentally detected right temporal region meningioma. Active herpes encephalitis cannot be entirely excluded by MR imaging.   High resolution T2-weighted imaging through the right temporal lobe mass would prove helpful to determine if this is extra-axial in location.  Please see above discussion.  Paranasal sinus opacification with air fluid levels raising the possibility of acute sinusitis.  Critical Value/emergent results were called by telephone at the time of interpretation on 08/17/2012 at 5:40 p.m. to Dr. Silverio Lay, who verbally acknowledged these results.   Original Report Authenticated By: Lacy Duverney, M.D.      No diagnosis found.    MDM  Pt BIB brother and sister-in-law for AMS for unknown length of time.  Pt was evaluated by Dr. Rosalia Hammers and myself then reevaluated by Dr. Silverio Lay who performed spinal tap based on head CT and MR that indicated herpes encephalitis.    CMP: hypokalemia-given K Drug Screen: pos-opiates, benzos, and amphetamines  UA: tntc wbc, many bacteria. indicates UTI CXR: patchy right basilar air space, suspicious for possible aspiration in clinical context. CT head: likely herpes encephalitis MR head: stronger evidence of herpes encephalitis   Dr. Silverio Lay consulted neurology who advised to complete spinal tap and start empiric antiviral tx.  Pt is also being tx for UTI and likely  pneumonia: Acyclovir, azithromycin, and cephtriaxone   Dr. Silverio Lay is going to take over pt care and call hospitalist.           Junius Finner, PA-C 08/17/12 2033

## 2012-08-18 ENCOUNTER — Inpatient Hospital Stay (HOSPITAL_COMMUNITY): Payer: BC Managed Care – PPO

## 2012-08-18 DIAGNOSIS — F329 Major depressive disorder, single episode, unspecified: Secondary | ICD-10-CM

## 2012-08-18 LAB — BASIC METABOLIC PANEL
CO2: 21 mEq/L (ref 19–32)
Calcium: 8.3 mg/dL — ABNORMAL LOW (ref 8.4–10.5)
Chloride: 105 mEq/L (ref 96–112)
Creatinine, Ser: 0.51 mg/dL (ref 0.50–1.10)
GFR calc Af Amer: >90 mL/min (ref >90)
Sodium: 136 mEq/L (ref 135–145)

## 2012-08-18 LAB — URINE CULTURE: Colony Count: 25000

## 2012-08-18 LAB — HIV ANTIBODY (ROUTINE TESTING W REFLEX): HIV: NONREACTIVE

## 2012-08-18 LAB — VITAMIN B12: Vitamin B-12: 1459 pg/mL — ABNORMAL HIGH (ref 211–911)

## 2012-08-18 LAB — TSH: TSH: 1.815 u[IU]/mL (ref 0.350–4.500)

## 2012-08-18 MED ORDER — LORAZEPAM 2 MG/ML IJ SOLN
1.0000 mg | Freq: Once | INTRAMUSCULAR | Status: AC
  Administered 2012-08-18: 1 mg via INTRAVENOUS
  Filled 2012-08-18: qty 1

## 2012-08-18 MED ORDER — VANCOMYCIN HCL IN DEXTROSE 1-5 GM/200ML-% IV SOLN
1000.0000 mg | Freq: Two times a day (BID) | INTRAVENOUS | Status: DC
  Administered 2012-08-18 – 2012-08-19 (×2): 1000 mg via INTRAVENOUS
  Filled 2012-08-18 (×3): qty 200

## 2012-08-18 MED ORDER — TEMAZEPAM 15 MG PO CAPS
15.0000 mg | ORAL_CAPSULE | Freq: Every evening | ORAL | Status: DC | PRN
  Administered 2012-08-18 – 2012-08-22 (×2): 15 mg via ORAL
  Filled 2012-08-18 (×2): qty 1

## 2012-08-18 MED ORDER — GUAIFENESIN-DM 100-10 MG/5ML PO SYRP
5.0000 mL | ORAL_SOLUTION | ORAL | Status: DC | PRN
  Administered 2012-08-18 – 2012-08-23 (×10): 5 mL via ORAL
  Filled 2012-08-18 (×13): qty 10

## 2012-08-18 MED ORDER — DEXTROSE 5 % IV SOLN
10.0000 mg/kg | Freq: Three times a day (TID) | INTRAVENOUS | Status: DC
  Administered 2012-08-18 – 2012-08-20 (×5): 480 mg via INTRAVENOUS
  Filled 2012-08-18 (×6): qty 9.6

## 2012-08-18 NOTE — Progress Notes (Signed)
[facility] PROGRESS NOTE  Emily Steele ZDG:[id number] DOB: 1955/05/30 DOA: 08/17/2012 PCP: Dow Adolph, MD  Assessment/Plan: 1. Acute encephalopathy: possibly a combination of infectious and non infectious causes from drug use.  - LP done and CSF analysis pending.  - vancomycin to be added as per discussion with Dr snider.  - MRI with thin slides pending and to be scheduled tomorrow - EEG ordered and pending.  - ID /NEUROon board to assist [place].   2. Depression: resume home medications.   3. CAP; resume rocephin and zithromax.   4. Hypokalemia: will be repleted as needed.   5. Abnormal UA: CULTURES pending.   6. Abnormal UDS: contributing to the encephalopathy.   7. Hypothyroidism; resume synthroid.   DVT prophylaxis.   Code Status: full code Family Communication:  None at bedside Disposition Plan: pending   Consultants:  Neurology  ID    Antibiotics:  ROCEPHIN   ACYCLOVIR.  HPI/Subjective: Appears comfortable, and is oriented to person and place .   Objective: Filed Vitals:   08/17/12 2130 08/17/12 2300 08/18/12 0600 08/18/12 1346  BP: 109/67 136/88 122/70 114/77  Pulse: 92 77 70 75  Temp:  98.2 F (36.8 C) 97.7 F (36.5 C) 98.1 F (36.7 C)  TempSrc:  Oral Oral Oral  Resp:  20 20 20   Height:  5\' 1"  (1.549 m)    Weight:  87.998 kg (194 lb)    SpO2: 87% 98% 98% 95%    Intake/Output Summary (Last 24 hours) at 08/18/12 1724 Last data filed at 08/18/12 1346  Gross per 24 hour  Intake    480 ml  Output      0 ml  Net    480 ml   Filed Weights   08/17/12 2055 08/17/12 2300  Weight: 88.451 kg (195 lb) 87.998 kg (194 lb)    Exam: She is alert afebrile and oriented to person and place.  CHEST: Normal respiration, clear to auscultation bilaterally.  HEART: Regular rate and rhythm. There are no murmur, rub, or gallops.  BACK: No kyphosis or scoliosis; no CVA tenderness  ABDOMEN: soft and non-tender; no masses, no organomegaly, normal  abdominal bowel sounds; no pannus; no intertriginous candida. There is no rebound and no distention.  EXTREMITIES: No bone or joint deformity; age-appropriate arthropathy of the hands and knees; no edema; no ulcerations. There is no calf tendernes NEURO: no motor deficits. No facial asymmetry.    Data Reviewed: Basic Metabolic Panel:  Recent Labs Lab 08/17/12 1341 08/18/12 0911  NA 131* 136  K 2.8* 3.9  CL 95* 105  CO2 24 21  GLUCOSE 98 118*  BUN 13 3*  CREATININE 0.69 0.51  CALCIUM 8.8 8.3*   Liver Function Tests:  Recent Labs Lab 08/17/12 1341  AST 43*  ALT 27  ALKPHOS 76  BILITOT 0.4  PROT 6.1  ALBUMIN 3.0*   No results found for this basename: LIPASE, AMYLASE,  in the last 168 hours No results found for this basename: AMMONIA,  in the last 168 hours CBC:  Recent Labs Lab 08/12/12 2126 08/17/12 1341  WBC 6.0 3.4*  HGB 13.9 12.2  HCT 43.6 35.1*  MCV 85.5 78.7  PLT  --  208   Cardiac Enzymes: No results found for this basename: CKTOTAL, CKMB, CKMBINDEX, TROPONINI,  in the last 168 hours BNP (last 3 results) No results found for this basename: PROBNP,  in the last 8760 hours CBG: No results found for this basename: GLUCAP,  in the last  168 hours  Recent Results (from the past 240 hour(s))  GRAM STAIN     Status: None   Collection Time    08/17/12  7:05 PM      Result Value Range Status   Specimen Description CSF   Final   Special Requests NONE   Final   Gram Stain     Final   Value: NO ORGANISMS SEEN     WBC PRESENT, PREDOMINANTLY MONONUCLEAR     CYTOSPIN SMEAR     Gram Stain Report Called to,Read Back By and Verified With: Emily Steele [id number] @ 2025 BY J SCOTTON   Report Status 08/17/2012 FINAL   Final  CSF CULTURE     Status: None   Collection Time    08/17/12  7:06 PM      Result Value Range Status   Specimen Description CSF   Final   Special Requests NONE   Final   Gram Stain     Final   Value: CYTOSPIN SMEAR WBC PRESENT, PREDOMINANTLY  MONONUCLEAR     NO ORGANISMS SEEN     Gram Stain Report Called to,Read Back By and Verified With: Gram Stain Report Called to,Read Back By and Verified With: Emily Rana PA [id number] @ 2025 BY J SCOTTON Performed by [facility]   Culture PENDING   Incomplete   Report Status PENDING   Incomplete     Studies: Dg Chest 2 View  08/17/2012  *RADIOLOGY REPORT*  Clinical Data: Confusion.  Cough.  CHEST - 2 VIEW  Comparison: None.  Findings: There are low lung volumes.  The heart size and mediastinal contours are normal.  There is patchy airspace disease medially at the right lung base, best seen on the frontal examination.  The left lung is clear.  There is no pleural effusion or pneumothorax.  Telemetry leads overlie the chest.  Degenerative changes are present throughout the thoracic spine.  IMPRESSION: Patchy right basilar air space, suspicious for possible aspiration in this clinical context.   Original Report Authenticated By: Carey Bullocks, M.D.    Ct Head Wo Contrast  08/17/2012  *RADIOLOGY REPORT*  Clinical Data: Altered mental status with speech changes.  CT HEAD WITHOUT CONTRAST  Technique:  Contiguous axial images were obtained from the base of the skull through the vertex without contrast.  Comparison: None.  Findings: There is a low density throughout the right temporal lobe with volume loss and prominence of the overlying sulci.  The left temporal lobe appears normal.  There is no evidence of acute intracranial hemorrhage, focal extra-axial fluid collection or hydrocephalus. Some relatively high density is noted superior to the right petrous apex along the posterior aspect of this temporal lobe low density.  This likely represents volume averaging with adjacent normal brain tissue, although could indicate a meningioma.  Mucosal thickening is present throughout the ethmoid sinuses. There are air-fluid levels in the maxillary and sphenoid sinuses. The mastoids and middle ears are clear.  The  calvarium is intact.  IMPRESSION:  1.  Abnormal process in the right temporal lobe has a chronic component manifesting as volume loss and may be the sequela of prior infarction or trauma.  However, an acute superimposed process such as herpes encephalitis cannot be completely excluded in this clinical context. 2.  Possible meningioma over the right petrous apex. 3.  No evidence of acute intracranial hemorrhage or positive mass effect. 4.  Sinusitis.  MRI of the brain without and with contrast is recommended for further  evaluation. These results were called by telephone on 08/17/2012 at 1545 hours to Dr. Rosalia Hammers, who verbally acknowledged these results.   Original Report Authenticated By: Carey Bullocks, M.D.    Mr Laqueta Jean Wo Contrast  08/17/2012  *RADIOLOGY REPORT*  Clinical Data: Altered mental status.  MRI HEAD WITHOUT AND WITH CONTRAST  Technique:  Multiplanar, multiecho pulse sequences of the brain and surrounding structures were obtained according to standard protocol without and with intravenous contrast  Contrast: 18mL MULTIHANCE GADOBENATE DIMEGLUMINE 529 MG/ML IV SOLN  Comparison: 08/17/2012 CT.  Findings: As best appreciated on FLAIR sequence (series 6) is abnormal signal of the right temporal lobe (including involvement of the right hippocampus and uncus).  Abnormal signal also noted in the sub insular region bilaterally as well as the medial subfrontal region.  This distribution of changes raises possibility of herpes encephalitis.  As there is atrophy of the anterior right temporal lobe, it is possible the patient had herpes in the past and what is noted on the current exam represents sequelae of prior infection however, active infection cannot be excluded by MR imaging. No restricted motion as can be seen with acute herpes.  Confounding factor is that there is a 1.3 x 1.5 x 1.6 cm enhancing mass lateral aspect of the mid temporal region. It is difficult to state with certainty that this is extra-axial in  location as may be expected with a meningioma.  High resolution thin section T2- weighted imaging would prove helpful to determine if this is extra- axial and exclude possibility of this representing an intra-axial lesion.  No acute infarct.  No intracranial hemorrhage.  No hydrocephalus.  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  Major intracranial vascular structures are patent.  Degenerative changes cervical spine most notable C5-6 with slight cord flattening.  Paranasal sinus mucosal thickening / opacification most notable involving the ethmoid sinus air cells, left sphenoid sinus air cell and maxillary sinuses where there are air fluid levels which may indicate acute sinusitis.  IMPRESSION: Abnormal MRI scan may represent combination of result of prior herpes encephalitis and incidentally detected right temporal region meningioma. Active herpes encephalitis cannot be entirely excluded by MR imaging.   High resolution T2-weighted imaging through the right temporal lobe mass would prove helpful to determine if this is extra-axial in location.  Please see above discussion.  Paranasal sinus opacification with air fluid levels raising the possibility of acute sinusitis.  Critical Value/emergent results were called by telephone at the time of interpretation on 08/17/2012 at 5:40 p.m. to Dr. Silverio Lay, who verbally acknowledged these results.   Original Report Authenticated By: Lacy Duverney, M.D.     Scheduled Meds: . acyclovir  10 mg/kg (Ideal) Intravenous Q8H  . azithromycin  500 mg Intravenous Q24H  . buPROPion  150 mg Oral BID  . cefTRIAXone (ROCEPHIN)  IV  1 g Intravenous Q24H  . DULoxetine  60 mg Oral BID  . gabapentin  100 mg Oral TID  . levothyroxine  50 mcg Oral QAC breakfast  . potassium chloride  40 mEq Oral Daily  . sodium chloride  3 mL Intravenous Q12H  . sodium chloride  3 mL Intravenous Q12H   Continuous Infusions:   Principal Problem:   Altered mental status Active  Problems:   Major Depressive Disorder   Fibromyalgia   Hypothyroidism   Obesity   Herpetic encephalitis   CAP (community acquired pneumonia)    Time spent: 35 minutes    Yaman Grauberger  [facility] [employer]  [phone] If 7PM-7AM, please contact night-coverage at [email or web address], password [facility] 08/18/2012, 5:24 PM  LOS: 1 day

## 2012-08-18 NOTE — Progress Notes (Signed)
Received report from Emily Steele, ED.  Pt arrived to floor with two family members.  Pt encouraged to lay flat after lumbar puncture.  Pt family members expressed concerns about transportation to get pt back home as they drove in today from out of town and are returning tonight.  Pt oriented only to self. Pt states multiple times that she is in a hospital in [place].  When asked about thoughts of hurting herself or others, pt does say she has had thoughts of hurting herself but denies having those thoughts currently and denies having a plan.  Pt heard talking in room to someone when no one is there.  RN performed most admission history assessments however reliability of some of pt answers is in question due to altered mental status.

## 2012-08-18 NOTE — Consult Note (Signed)
[facility]  Total days of antibiotics 2        Day 2 acyclovir         Day 2 ceftriaxone 1gm daily        Day 2 azithromycin       Reason for Consult: encephalopathy    Referring Physician: Blake Divine  Principal Problem:   Altered mental status Active Problems:   Major Depressive Disorder   Fibromyalgia   Hypothyroidism   Obesity   Herpetic encephalitis   CAP (community acquired pneumonia)  This is taken from the chart since patient is poor historian, unable to answer questions.  HPI: Emily Steele is a 57 y.o. female with history of hypothyroidism, HTN, GERD, depression and fibromyalgia, chronic back pain and bilateral leg pain, brought in by family due to intermittent confusion over the past month. She was recently in [place], [place] caring for her mother but Family noted she has been more confused and intermittently inappropriateness as of mid march. She returned to [facility] in mid April, where she was noted to being more confused wiht medication non-adherence. Recently, she was pulled over by the police for not driving properly in part of town that she otherwise knows very well. In ED, she was afebrile, no leukocytosis.  LP done with CSF of 13 WBC, 98%lymph. Normal protein and glucose. Gram stain negative for organism. Her MRI showed abnormality in the temperal lobe region raises possibility of HSV Encephalopathy, either old or new, along with a possible meningioma. She was started on IV Acyclovir. She was seen by neurology for guidance in management. Her CXR also showed infiltrate consistent with PNA, and her UA was indicative of UTI . Hospitalist was asked to admit her for AMS, possible HSV E, CAP, and UTI. tox screen shows evidence of amphetamines, Benzos and opiates which also may contribute to encephalopathy. She does report having 2 wk history of cough. She states that she was seen in "[place]" today and left that hospital.  Past Medical History  Diagnosis Date    . Depression   . Fibromyalgia   . Hypothyroid   . Sleep behavior disorder, REM   . Osteopenia   . Obesity   . Plantar fasciitis   . Arthritis   . GERD (gastroesophageal reflux disease)   . Anemia   . Hypertension   . Osteoporosis   . Rosacea   . Eczema   . Fibromyalgia   . UTI (urinary tract infection)   . Hx of migraine headaches   . Constipation     Allergies:  Allergies  Allergen Reactions  . Doxycycline Other (See Comments)    headaches  . Sulfa Antibiotics     Headaches  . Methocarbamol Nausea Only    headaches   MEDICATIONS: . acyclovir  10 mg/kg (Ideal) Intravenous Q8H  . azithromycin  500 mg Intravenous Q24H  . buPROPion  150 mg Oral BID  . cefTRIAXone (ROCEPHIN)  IV  1 g Intravenous Q24H  . DULoxetine  60 mg Oral BID  . gabapentin  100 mg Oral TID  . levothyroxine  50 mcg Oral QAC breakfast  . potassium chloride  40 mEq Oral Daily  . sodium chloride  3 mL Intravenous Q12H  . sodium chloride  3 mL Intravenous Q12H    History  Substance Use Topics  . Smoking status: Never Smoker   . Smokeless tobacco: Not on file  . Alcohol Use: No    Family History  Problem  Relation Age of Onset  . Cancer Mother   . Hypertension Mother   . Cancer Sister   . Heart disease Sister   . Asthma Sister   . Asthma Brother   . Drug abuse Brother   . Hypertension Brother   . Drug abuse Daughter     Review of Systems -  10 point ROs negative except what is mentioned in HPI.  OBJECTIVE: Temp:  [97.7 F (36.5 C)-98.2 F (36.8 C)] 98.1 F (36.7 C) (05/04 1346) Pulse Rate:  [70-92] 75 (05/04 1346) Resp:  [16-20] 20 (05/04 1346) BP: (109-136)/(53-88) 114/77 mmHg (05/04 1346) SpO2:  [87 %-100 %] 95 % (05/04 1346) Weight:  [194 lb (87.998 kg)-195 lb (88.451 kg)] 194 lb (87.998 kg) (05/03 2300)  Physical Exam  Constitutional: He is oriented to person, x 1. Slightly disheveled. Psychomotor slowing in answering questions. No distress.  HENT:  Mouth/Throat:  Oropharynx is clear and moist. No oropharyngeal exudate. Poor dentition Cardiovascular: Normal rate, regular rhythm and normal heart sounds. Exam reveals no gallop and no friction rub.  No murmur heard.  Pulmonary/Chest: Effort normal and breath sounds normal. No respiratory distress. s no wheezes. Decrease breath sounds at bases Abdominal: Soft. Bowel sounds are normal.exhibits no distension. There is no tenderness.  Lymphadenopathy:  no cervical adenopathy.  Skin: Skin is warm and dry. No rash noted. No erythema.     LABS: Results for orders placed during the hospital encounter of 08/17/12 (from the past 48 hour(s))  CBC     Status: Abnormal   Collection Time    08/17/12  1:41 PM      Result Value Range   WBC 3.4 (*) 4.0 - 10.5 K/uL   RBC 4.46  3.87 - 5.11 MIL/uL   Hemoglobin 12.2  12.0 - 15.0 g/dL   HCT 11.9 (*) 14.7 - 82.9 %   MCV 78.7  78.0 - 100.0 fL   MCH 27.4  26.0 - 34.0 pg   MCHC 34.8  30.0 - 36.0 g/dL   RDW 56.2  13.0 - 86.5 %   Platelets 208  150 - 400 K/uL  COMPREHENSIVE METABOLIC PANEL     Status: Abnormal   Collection Time    08/17/12  1:41 PM      Result Value Range   Sodium 131 (*) 135 - 145 mEq/L   Potassium 2.8 (*) 3.5 - 5.1 mEq/L   Chloride 95 (*) 96 - 112 mEq/L   CO2 24  19 - 32 mEq/L   Glucose, Bld 98  70 - 99 mg/dL   BUN 13  6 - 23 mg/dL   Creatinine, Ser 7.84  0.50 - 1.10 mg/dL   Calcium 8.8  8.4 - 69.6 mg/dL   Total Protein 6.1  6.0 - 8.3 g/dL   Albumin 3.0 (*) 3.5 - 5.2 g/dL   AST 43 (*) 0 - 37 U/L   ALT 27  0 - 35 U/L   Alkaline Phosphatase 76  39 - 117 U/L   Total Bilirubin 0.4  0.3 - 1.2 mg/dL   GFR calc non Af Amer >90  >90 mL/min   GFR calc Af Amer >90  >90 mL/min   Comment:            The eGFR has been calculated     using the CKD EPI equation.     This calculation has not been     validated in all clinical     situations.     eGFR's  persistently     <90 mL/min signify     possible Chronic Kidney Disease.  ETHANOL     Status: None     Collection Time    08/17/12  1:41 PM      Result Value Range   Alcohol, Ethyl (B) <11  0 - 11 mg/dL   Comment:            LOWEST DETECTABLE LIMIT FOR     SERUM ALCOHOL IS 11 mg/dL     FOR MEDICAL PURPOSES ONLY  TSH     Status: None   Collection Time    08/17/12  2:42 PM      Result Value Range   TSH 1.509  0.350 - 4.500 uIU/mL  T3, FREE     Status: None   Collection Time    08/17/12  2:42 PM      Result Value Range   T3, Free 2.6  2.3 - 4.2 pg/mL  URINE RAPID DRUG SCREEN (HOSP PERFORMED)     Status: Abnormal   Collection Time    08/17/12  4:07 PM      Result Value Range   Opiates POSITIVE (*) NONE DETECTED   Cocaine NONE DETECTED  NONE DETECTED   Benzodiazepines POSITIVE (*) NONE DETECTED   Amphetamines POSITIVE (*) NONE DETECTED   Tetrahydrocannabinol NONE DETECTED  NONE DETECTED   Barbiturates NONE DETECTED  NONE DETECTED   Comment:            DRUG SCREEN FOR MEDICAL PURPOSES     ONLY.  IF CONFIRMATION IS NEEDED     FOR ANY PURPOSE, NOTIFY LAB     WITHIN 5 DAYS.                LOWEST DETECTABLE LIMITS     FOR URINE DRUG SCREEN     Drug Class       Cutoff (ng/mL)     Amphetamine      1000     Barbiturate      200     Benzodiazepine   200     Tricyclics       300     Opiates          300     Cocaine          300     THC              50  URINALYSIS, ROUTINE W REFLEX MICROSCOPIC     Status: Abnormal   Collection Time    08/17/12  4:07 PM      Result Value Range   Color, Urine AMBER (*) YELLOW   Comment: BIOCHEMICALS MAY BE AFFECTED BY COLOR   APPearance TURBID (*) CLEAR   Specific Gravity, Urine 1.030  1.005 - 1.030   pH 6.0  5.0 - 8.0   Glucose, UA NEGATIVE  NEGATIVE mg/dL   Hgb urine dipstick SMALL (*) NEGATIVE   Bilirubin Urine SMALL (*) NEGATIVE   Ketones, ur NEGATIVE  NEGATIVE mg/dL   Protein, ur 30 (*) NEGATIVE mg/dL   Urobilinogen, UA 1.0  0.0 - 1.0 mg/dL   Nitrite NEGATIVE  NEGATIVE   Leukocytes, UA LARGE (*) NEGATIVE  URINE MICROSCOPIC-ADD ON      Status: Abnormal   Collection Time    08/17/12  4:07 PM      Result Value Range   Squamous Epithelial / LPF RARE  RARE   WBC, UA TOO NUMEROUS TO COUNT  <3  WBC/hpf   RBC / HPF 0-2  <3 RBC/hpf   Bacteria, UA MANY (*) RARE   Urine-Other LESS THAN 10 mL OF URINE SUBMITTED     Comment: MICROSCOPIC EXAM PERFORMED ON UNCONCENTRATED URINE     MUCOUS PRESENT  GRAM STAIN     Status: None   Collection Time    08/17/12  7:05 PM      Result Value Range   Specimen Description CSF     Special Requests NONE     Gram Stain       Value: NO ORGANISMS SEEN     WBC PRESENT, PREDOMINANTLY MONONUCLEAR     CYTOSPIN SMEAR     Gram Stain Report Called to,Read Back By and Verified With: Lizbeth Bark [id number] @ 2025 BY J SCOTTON   Report Status 08/17/2012 FINAL    CSF CELL COUNT WITH DIFFERENTIAL     Status: Abnormal   Collection Time    08/17/12  7:06 PM      Result Value Range   Tube # 4     Comment: CELL COUNT NOT NEEDED ON TUBE 1 PER OMALLEY, E. AT 2025 ON 05.03.14 SCOTTON, J.     CORRECTED ON 05/03 AT 2045: PREVIOUSLY REPORTED AS 1 and 4   Color, CSF COLORLESS  COLORLESS   Appearance, CSF CLEAR  CLEAR   Supernatant NOT INDICATED     RBC Count, CSF 7 (*) 0 /cu mm   WBC, CSF 13 (*) 0 - 5 /cu mm   Comment: CRITICAL RESULT CALLED TO, READ BACK BY AND VERIFIED WITH:     ERIN OMALLEY,PA [id number] @ 2025 BY J SCOTTON   Segmented Neutrophils-CSF 0  0 - 6 %   Comment: PERFORMED ON TUBE 2 DUE TO INSUFFICIENT VOLUME IN TUBE 4   Lymphs, CSF 98 (*) 40 - 80 %   Comment: PERFORMED ON TUBE 2 DUE TO INSUFFICIENT VOLUME IN TUBE 4   Monocyte-Macrophage-Spinal Fluid 2 (*) 15 - 45 %   Comment: PERFORMED ON TUBE 2 DUE TO INSUFFICIENT VOLUME IN TUBE 4   Eosinophils, CSF    0 - 1 %   Value: PERFORMED ON TUBE 2 DUE TO INSUFFICIENT VOLUME IN TUBE 4  GLUCOSE, CSF     Status: None   Collection Time    08/17/12  7:06 PM      Result Value Range   Glucose, CSF 57  43 - 76 mg/dL  PROTEIN, CSF     Status: None   Collection  Time    08/17/12  7:06 PM      Result Value Range   Total  Protein, CSF 26  15 - 45 mg/dL  CSF CULTURE     Status: None   Collection Time    08/17/12  7:06 PM      Result Value Range   Specimen Description CSF     Special Requests NONE     Gram Stain       Value: CYTOSPIN SMEAR WBC PRESENT, PREDOMINANTLY MONONUCLEAR     NO ORGANISMS SEEN     Gram Stain Report Called to,Read Back By and Verified With: Gram Stain Report Called to,Read Back By and Verified With: Margarita Rana PA [id number] @ 2025 BY J SCOTTON Performed by [facility]   Culture PENDING     Report Status PENDING    TSH     Status: None   Collection Time    08/18/12  5:30 AM  Result Value Range   TSH 1.815  0.350 - 4.500 uIU/mL  VITAMIN B12     Status: Abnormal   Collection Time    08/18/12  5:30 AM      Result Value Range   Vitamin B-12 1459 (*) 211 - 911 pg/mL  BASIC METABOLIC PANEL     Status: Abnormal   Collection Time    08/18/12  9:11 AM      Result Value Range   Sodium 136  135 - 145 mEq/L   Potassium 3.9  3.5 - 5.1 mEq/L   Comment: DELTA CHECK NOTED     NO VISIBLE HEMOLYSIS     REPEATED TO VERIFY   Chloride 105  96 - 112 mEq/L   Comment: DELTA CHECK NOTED     REPEATED TO VERIFY   CO2 21  19 - 32 mEq/L   Glucose, Bld 118 (*) 70 - 99 mg/dL   BUN 3 (*) 6 - 23 mg/dL   Creatinine, Ser 1.61  0.50 - 1.10 mg/dL   Calcium 8.3 (*) 8.4 - 10.5 mg/dL   GFR calc non Af Amer >90  >90 mL/min   GFR calc Af Amer >90  >90 mL/min   Comment:            The eGFR has been calculated     using the CKD EPI equation.     This calculation has not been     validated in all clinical     situations.     eGFR's persistently     <90 mL/min signify     possible Chronic Kidney Disease.    MICRO: 5/3 csf pending 5/3 urine cx pending IMAGING: Dg Chest 2 View  08/17/2012  *RADIOLOGY REPORT*  Clinical Data: Confusion.  Cough.  CHEST - 2 VIEW  Comparison: None.  Findings: There are low lung volumes.  The heart size and  mediastinal contours are normal.  There is patchy airspace disease medially at the right lung base, best seen on the frontal examination.  The left lung is clear.  There is no pleural effusion or pneumothorax.  Telemetry leads overlie the chest.  Degenerative changes are present throughout the thoracic spine.  IMPRESSION: Patchy right basilar air space, suspicious for possible aspiration in this clinical context.   Original Report Authenticated By: Carey Bullocks, M.D.    Ct Head Wo Contrast  08/17/2012  *RADIOLOGY REPORT*  Clinical Data: Altered mental status with speech changes.  CT HEAD WITHOUT CONTRAST  Technique:  Contiguous axial images were obtained from the base of the skull through the vertex without contrast.  Comparison: None.  Findings: There is a low density throughout the right temporal lobe with volume loss and prominence of the overlying sulci.  The left temporal lobe appears normal.  There is no evidence of acute intracranial hemorrhage, focal extra-axial fluid collection or hydrocephalus. Some relatively high density is noted superior to the right petrous apex along the posterior aspect of this temporal lobe low density.  This likely represents volume averaging with adjacent normal brain tissue, although could indicate a meningioma.  Mucosal thickening is present throughout the ethmoid sinuses. There are air-fluid levels in the maxillary and sphenoid sinuses. The mastoids and middle ears are clear.  The calvarium is intact.  IMPRESSION:  1.  Abnormal process in the right temporal lobe has a chronic component manifesting as volume loss and may be the sequela of prior infarction or trauma.  However, an acute superimposed process such as herpes  encephalitis cannot be completely excluded in this clinical context. 2.  Possible meningioma over the right petrous apex. 3.  No evidence of acute intracranial hemorrhage or positive mass effect. 4.  Sinusitis.  MRI of the brain without and with contrast is  recommended for further evaluation. These results were called by telephone on 08/17/2012 at 1545 hours to Dr. Rosalia Hammers, who verbally acknowledged these results.   Original Report Authenticated By: Carey Bullocks, M.D.    Mr Laqueta Jean Wo Contrast  08/17/2012  *RADIOLOGY REPORT*  Clinical Data: Altered mental status.  MRI HEAD WITHOUT AND WITH CONTRAST  Technique:  Multiplanar, multiecho pulse sequences of the brain and surrounding structures were obtained according to standard protocol without and with intravenous contrast  Contrast: 18mL MULTIHANCE GADOBENATE DIMEGLUMINE 529 MG/ML IV SOLN  Comparison: 08/17/2012 CT.  Findings: As best appreciated on FLAIR sequence (series 6) is abnormal signal of the right temporal lobe (including involvement of the right hippocampus and uncus).  Abnormal signal also noted in the sub insular region bilaterally as well as the medial subfrontal region.  This distribution of changes raises possibility of herpes encephalitis.  As there is atrophy of the anterior right temporal lobe, it is possible the patient had herpes in the past and what is noted on the current exam represents sequelae of prior infection however, active infection cannot be excluded by MR imaging. No restricted motion as can be seen with acute herpes.  Confounding factor is that there is a 1.3 x 1.5 x 1.6 cm enhancing mass lateral aspect of the mid temporal region. It is difficult to state with certainty that this is extra-axial in location as may be expected with a meningioma.  High resolution thin section T2- weighted imaging would prove helpful to determine if this is extra- axial and exclude possibility of this representing an intra-axial lesion.  No acute infarct.  No intracranial hemorrhage.  No hydrocephalus.  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  Major intracranial vascular structures are patent.  Degenerative changes cervical spine most notable C5-6 with slight cord flattening.  Paranasal  sinus mucosal thickening / opacification most notable involving the ethmoid sinus air cells, left sphenoid sinus air cell and maxillary sinuses where there are air fluid levels which may indicate acute sinusitis.  IMPRESSION: Abnormal MRI scan may represent combination of result of prior herpes encephalitis and incidentally detected right temporal region meningioma. Active herpes encephalitis cannot be entirely excluded by MR imaging.   High resolution T2-weighted imaging through the right temporal lobe mass would prove helpful to determine if this is extra-axial in location.  Please see above discussion.  Paranasal sinus opacification with air fluid levels raising the possibility of acute sinusitis.  Critical Value/emergent results were called by telephone at the time of interpretation on 08/17/2012 at 5:40 p.m. to Dr. Silverio Lay, who verbally acknowledged these results.   Original Report Authenticated By: Lacy Duverney, M.D.     Assessment/Plan: 57 year old female with encephalopathy in the setting of pneumonia and urinary tract infection and positive tox screen. Underwent LP with HSV, VDRL, HIV, lyme pending. Etiology of encephalopathy has large differential of infectious vs. Non-infectious causes  Recommendations:  1) continue with acyclovir until HSV PCR returns. Other non-infectious causes for subacute encephalopathy should be considered. Does she have temporal lobe process ( dementia, epilepsy, demylination) that is causing this process. Defer to neurology for work up. EEG would be helpful. Could this be compounded by drug use as noted on tox screen, which should get  out of her system soon.  2) RLL pneumonia =continue with cap treatment with ceftriaxone and azithromycin. If productive cough can still send sputum for culture  3) possible uti = await culture results to narrow spectrum, although she denies dysuria. Likely asymptomatic bacturia  Ryla Cauthon B. Drue Second MD MPH [facility] [phone]

## 2012-08-18 NOTE — Progress Notes (Signed)
ANTIBIOTIC CONSULT NOTE - INITIAL  Pharmacy Consult for Vancomycin Indication: encephalitis  Allergies  Allergen Reactions  . Doxycycline Other (See Comments)    headaches  . Sulfa Antibiotics     Headaches  . Methocarbamol Nausea Only    headaches    Patient Measurements: Height: 5\' 1"  (154.9 cm) Weight: 194 lb (87.998 kg) IBW/kg (Calculated) : 47.8 Adjusted Body Weight:   Vital Signs: Temp: 98.1 F (36.7 C) (05/04 1346) Temp src: Oral (05/04 1346) BP: 114/77 mmHg (05/04 1346) Pulse Rate: 75 (05/04 1346) Intake/Output from previous day:   Intake/Output from this shift:    Labs:  Recent Labs  08/17/12 1341 08/18/12 0911  WBC 3.4*  --   HGB 12.2  --   PLT 208  --   CREATININE 0.69 0.51   Estimated Creatinine Clearance: 80.2 ml/min (by C-G formula based on Cr of 0.51). No results found for this basename: VANCOTROUGH, Leodis Binet, VANCORANDOM, GENTTROUGH, GENTPEAK, GENTRANDOM, TOBRATROUGH, TOBRAPEAK, TOBRARND, AMIKACINPEAK, AMIKACINTROU, AMIKACIN,  in the last 72 hours   Microbiology: Recent Results (from the past 720 hour(s))  URINE CULTURE     Status: None   Collection Time    08/17/12  4:07 PM      Result Value Range Status   Specimen Description URINE, CLEAN CATCH   Final   Special Requests NONE   Final   Culture  Setup Time 08/17/2012 20:06   Final   Colony Count 25,000 COLONIES/ML   Final   Culture     Final   Value: Multiple bacterial morphotypes present, none predominant. Suggest appropriate recollection if clinically indicated.   Report Status 08/18/2012 FINAL   Final  GRAM STAIN     Status: None   Collection Time    08/17/12  7:05 PM      Result Value Range Status   Specimen Description CSF   Final   Special Requests NONE   Final   Gram Stain     Final   Value: NO ORGANISMS SEEN     WBC PRESENT, PREDOMINANTLY MONONUCLEAR     CYTOSPIN SMEAR     Gram Stain Report Called to,Read Back By and Verified With: Lizbeth Bark [id number] @ 2025 BY J  SCOTTON   Report Status 08/17/2012 FINAL   Final  CSF CULTURE     Status: None   Collection Time    08/17/12  7:06 PM      Result Value Range Status   Specimen Description CSF   Final   Special Requests NONE   Final   Gram Stain     Final   Value: CYTOSPIN SMEAR WBC PRESENT, PREDOMINANTLY MONONUCLEAR     NO ORGANISMS SEEN     Gram Stain Report Called to,Read Back By and Verified With: Gram Stain Report Called to,Read Back By and Verified With: Margarita Rana PA [id number] @ 2025 BY J SCOTTON Performed by [facility]   Culture PENDING   Incomplete   Report Status PENDING   Incomplete    Medical History: Past Medical History  Diagnosis Date  . Depression   . Fibromyalgia   . Hypothyroid   . Sleep behavior disorder, REM   . Osteopenia   . Obesity   . Plantar fasciitis   . Arthritis   . GERD (gastroesophageal reflux disease)   . Anemia   . Hypertension   . Osteoporosis   . Rosacea   . Eczema   . Fibromyalgia   . UTI (urinary tract infection)   .  Hx of migraine headaches   . Constipation     Medications:  Scheduled:  . acyclovir  10 mg/kg (Ideal) Intravenous Q8H  . [COMPLETED] acyclovir  10 mg/kg (Order-Specific) Intravenous Once  . [COMPLETED] acyclovir  890 mg Intravenous Q8H  . [COMPLETED] azithromycin (ZITHROMAX) 500 MG IVPB  500 mg Intravenous Once  . azithromycin  500 mg Intravenous Q24H  . buPROPion  150 mg Oral BID  . [COMPLETED] cefTRIAXone (ROCEPHIN)  IV  1 g Intravenous Once  . cefTRIAXone (ROCEPHIN)  IV  1 g Intravenous Q24H  . DULoxetine  60 mg Oral BID  . gabapentin  100 mg Oral TID  . levothyroxine  50 mcg Oral QAC breakfast  . LORazepam  1 mg Intravenous Once  . [COMPLETED] potassium chloride  10 mEq Intravenous Q1 Hr x 3  . potassium chloride  40 mEq Oral Daily  . sodium chloride  3 mL Intravenous Q12H  . sodium chloride  3 mL Intravenous Q12H  . [DISCONTINUED] azithromycin  500 mg Oral Daily   Infusions:   PRN: sodium chloride,  guaiFENesin-dextromethorphan, sodium chloride, temazepam Assessment: 57 yo with encephalitis  Goal of Therapy:  Vancomycin trough level 15-20 mcg/ml  Plan:  Begin Vancomycin 1000mg  IV q 12 hours. Measure antibiotic drug levels at steady state Follow up culture results  Loletta Specter 08/18/2012,7:53 PM

## 2012-08-18 NOTE — Progress Notes (Signed)
ANTIBIOTIC CONSULT NOTE - INITIAL  Pharmacy Consult for acyclovir Indication: HSE  Allergies  Allergen Reactions  . Doxycycline Other (See Comments)    headaches  . Sulfa Antibiotics     Headaches  . Methocarbamol Nausea Only    headaches    Patient Measurements: Height: 5\' 1"  (154.9 cm) Weight: 195 lb (88.451 kg) IBW/kg (Calculated) : 47.8 Adjusted Body Weight:   Vital Signs: Temp: 97.7 F (36.5 C) (05/03 1509) Temp src: Rectal (05/03 1509) BP: 109/67 mmHg (05/03 2130) Pulse Rate: 92 (05/03 2130) Intake/Output from previous day:   Intake/Output from this shift:    Labs:  Recent Labs  08/17/12 1341  WBC 3.4*  HGB 12.2  PLT 208  CREATININE 0.69   Estimated Creatinine Clearance: 80.4 ml/min (by C-G formula based on Cr of 0.69). No results found for this basename: VANCOTROUGH, Leodis Binet, VANCORANDOM, GENTTROUGH, GENTPEAK, GENTRANDOM, TOBRATROUGH, TOBRAPEAK, TOBRARND, AMIKACINPEAK, AMIKACINTROU, AMIKACIN,  in the last 72 hours   Microbiology: Recent Results (from the past 720 hour(s))  GRAM STAIN     Status: None   Collection Time    08/17/12  7:05 PM      Result Value Range Status   Specimen Description CSF   Final   Special Requests NONE   Final   Gram Stain     Final   Value: NO ORGANISMS SEEN     WBC PRESENT, PREDOMINANTLY MONONUCLEAR     CYTOSPIN SMEAR     Gram Stain Report Called to,Read Back By and Verified With: Lizbeth Bark [id number] @ 2025 BY J SCOTTON   Report Status 08/17/2012 FINAL   Final    Medical History: Past Medical History  Diagnosis Date  . Depression   . Fibromyalgia   . Hypothyroid   . Sleep behavior disorder, REM   . Osteopenia   . Obesity   . Plantar fasciitis   . Arthritis   . GERD (gastroesophageal reflux disease)   . Anemia   . Hypertension   . Osteoporosis   . Rosacea   . Eczema   . Fibromyalgia   . UTI (urinary tract infection)   . Hx of migraine headaches   . Constipation     Medications:  Anti-infectives    Start     Dose/Rate Route Frequency Ordered Stop   08/18/12 2000  cefTRIAXone (ROCEPHIN) 1 g in dextrose 5 % 50 mL IVPB     1 g 100 mL/hr over 30 Minutes Intravenous Every 24 hours 08/17/12 2310     08/18/12 2000  azithromycin (ZITHROMAX) 500 mg in dextrose 5 % 250 mL IVPB     500 mg 250 mL/hr over 60 Minutes Intravenous Every 24 hours 08/17/12 2310     08/18/12 1000  azithromycin (ZITHROMAX) tablet 500 mg  Status:  Discontinued     500 mg Oral Daily 08/17/12 2310 08/17/12 2317   08/17/12 2345  acyclovir (ZOVIRAX) 890 mg in dextrose 5 % 150 mL IVPB     890 mg 167.8 mL/hr over 60 Minutes Intravenous 3 times per day 08/17/12 2331     08/17/12 1800  acyclovir (ZOVIRAX) 860 mg in dextrose 5 % 150 mL IVPB     10 mg/kg  86 kg (Order-Specific) 167.2 mL/hr over 60 Minutes Intravenous  Once 08/17/12 1742 08/17/12 2025   08/17/12 1745  cefTRIAXone (ROCEPHIN) 1 g in dextrose 5 % 50 mL IVPB     1 g 100 mL/hr over 30 Minutes Intravenous  Once 08/17/12 1742 08/17/12 2055  08/17/12 1745  azithromycin (ZITHROMAX) 500 mg in dextrose 5 % 250 mL IVPB     500 mg 250 mL/hr over 60 Minutes Intravenous  Once 08/17/12 1744 08/17/12 2210     Assessment: Patient with HSE.  Goal of Therapy:  Acyclovir dosed based on patient weight and renal function   Plan:  Follow up culture results Acyclovir 10mg /kg (890mg ) iv q8hr  Darlina Guys, Lianni Kanaan Crowford 08/18/2012,1:06 AM

## 2012-08-18 NOTE — Progress Notes (Signed)
ANTIBIOTIC CONSULT NOTE - Follow up  Pharmacy Consult for acyclovir Indication: herpes encephalitis    Allergies  Allergen Reactions  . Doxycycline Other (See Comments)    headaches  . Sulfa Antibiotics     Headaches  . Methocarbamol Nausea Only    headaches    Patient Measurements: Height: 5\' 1"  (154.9 cm) Weight: 194 lb (87.998 kg) IBW/kg (Calculated) : 47.8  Vital Signs: Temp: 97.7 F (36.5 C) (05/04 0600) Temp src: Oral (05/04 0600) BP: 122/70 mmHg (05/04 0600) Pulse Rate: 70 (05/04 0600)  Labs:  Recent Labs  08/17/12 1341 08/18/12 0911  WBC 3.4*  --   HGB 12.2  --   PLT 208  --   CREATININE 0.69 0.51   Estimated Creatinine Clearance: 80.2 ml/min (by C-G formula based on Cr of 0.51).  Microbiology: Recent Results (from the past 720 hour(s))  GRAM STAIN     Status: None   Collection Time    08/17/12  7:05 PM      Result Value Range Status   Specimen Description CSF   Final   Special Requests NONE   Final   Gram Stain     Final   Value: NO ORGANISMS SEEN     WBC PRESENT, PREDOMINANTLY MONONUCLEAR     CYTOSPIN SMEAR     Gram Stain Report Called to,Read Back By and Verified With: Lizbeth Bark [id number] @ 2025 BY J SCOTTON   Report Status 08/17/2012 FINAL   Final  CSF CULTURE     Status: None   Collection Time    08/17/12  7:06 PM      Result Value Range Status   Specimen Description CSF   Final   Special Requests NONE   Final   Gram Stain     Final   Value: CYTOSPIN SMEAR WBC PRESENT, PREDOMINANTLY MONONUCLEAR     NO ORGANISMS SEEN     Gram Stain Report Called to,Read Back By and Verified With: Gram Stain Report Called to,Read Back By and Verified With: Margarita Rana PA [id number] @ 2025 BY J SCOTTON Performed by [facility]   Culture PENDING   Incomplete   Report Status PENDING   Incomplete    Assessment: 57 yo F admit 5/4 with AMS and probable herpes encephalitis.  Pharmacy asked to assist with Acyclovir IV dosing.  CSF grain stain:  No  organisms seen (final)  CSF culture is pending.  HSV PCR pending.  SCr is stable with CrCl ~ 80 ml/min  Goal of Therapy:  Acyclovir dosed based on patient weight (IBW in obesity) and renal function  Plan:   Acyclovir 480mg  IV q8 hours  Follow up renal function and cultures as available.  Lynann Beaver PharmD, BCPS Pager [phone] 08/18/2012 11:53 AM

## 2012-08-18 NOTE — ED Provider Notes (Signed)
History     CSN: [id number]  Arrival date & time 08/17/12  1256   First MD Initiated Contact with Patient 08/17/12 1423      Chief Complaint  Patient presents with  . Medical Clearance  . Failure To Thrive    (Consider location/radiation/quality/duration/timing/severity/associated sxs/prior treatment) HPI For the history and physical, please see separate note. This is a procedure note for Lumbar Puncture.    Past Medical History  Diagnosis Date  . Depression   . Fibromyalgia   . Hypothyroid   . Sleep behavior disorder, REM   . Osteopenia   . Obesity   . Plantar fasciitis   . Arthritis   . GERD (gastroesophageal reflux disease)   . Anemia   . Hypertension   . Osteoporosis   . Rosacea   . Eczema   . Fibromyalgia   . UTI (urinary tract infection)   . Hx of migraine headaches   . Constipation     Past Surgical History  Procedure Laterality Date  . Tonsillectomy and adenoidectomy    . Abdominal hysterectomy    . Spine surgery      Family History  Problem Relation Age of Onset  . Cancer Mother   . Hypertension Mother   . Cancer Sister   . Heart disease Sister   . Asthma Sister   . Asthma Brother   . Drug abuse Brother   . Hypertension Brother   . Drug abuse Daughter     History  Substance Use Topics  . Smoking status: Never Smoker   . Smokeless tobacco: Not on file  . Alcohol Use: No    OB History   Grav Para Term Preterm Abortions TAB SAB Ect Mult Living                  Review of Systems  Allergies  Doxycycline; Sulfa antibiotics; and Methocarbamol  Home Medications  No current outpatient prescriptions on file.  BP 114/77  Pulse 75  Temp(Src) 98.1 F (36.7 C) (Oral)  Resp 20  Ht 5\' 1"  (1.549 m)  Wt 194 lb (87.998 kg)  BMI 36.67 kg/m2  SpO2 95%  Physical Exam  ED Course  LUMBAR PUNCTURE Date/Time: 08/17/2012 6:54 PM Performed by: Richardean Canal Authorized by: Richardean Canal Consent: Verbal consent obtained. written consent  obtained. Risks and benefits: risks, benefits and alternatives were discussed Consent given by: brother. Patient understanding: patient states understanding of the procedure being performed Patient consent: the patient's understanding of the procedure matches consent given Procedure consent: procedure consent matches procedure scheduled Relevant documents: relevant documents present and verified Test results: test results available and properly labeled Site marked: the operative site was marked Imaging studies: imaging studies available Patient identity confirmed: verbally with patient and arm band Time out: Immediately prior to procedure a "time out" was called to verify the correct patient, procedure, equipment, support staff and site/side marked as required. Indications: evaluation for altered mental status and evaluation for infection Anesthesia: local infiltration Local anesthetic: lidocaine 2% without epinephrine Anesthetic total: 10 ml Preparation: Patient was prepped and draped in the usual sterile fashion. Lumbar space: L3-L4 interspace Patient's position: sitting Needle gauge: 22 Needle type: spinal needle - Quincke tip Needle length: 3.5 in Number of attempts: 1 Opening pressure: 23 cm H2O Fluid appearance: clear Tubes of fluid: 4 Total volume: 8 ml Post-procedure: site cleaned and adhesive bandage applied Patient tolerance: Patient tolerated the procedure well with no immediate complications.   (including critical care  time)  Labs Reviewed  CBC - Abnormal; Notable for the following:    WBC 3.4 (*)    HCT 35.1 (*)    All other components within normal limits  COMPREHENSIVE METABOLIC PANEL - Abnormal; Notable for the following:    Sodium 131 (*)    Potassium 2.8 (*)    Chloride 95 (*)    Albumin 3.0 (*)    AST 43 (*)    All other components within normal limits  URINE RAPID DRUG SCREEN (HOSP PERFORMED) - Abnormal; Notable for the following:    Opiates POSITIVE  (*)    Benzodiazepines POSITIVE (*)    Amphetamines POSITIVE (*)    All other components within normal limits  URINALYSIS, ROUTINE W REFLEX MICROSCOPIC - Abnormal; Notable for the following:    Color, Urine AMBER (*)    APPearance TURBID (*)    Hgb urine dipstick SMALL (*)    Bilirubin Urine SMALL (*)    Protein, ur 30 (*)    Leukocytes, UA LARGE (*)    All other components within normal limits  URINE MICROSCOPIC-ADD ON - Abnormal; Notable for the following:    Bacteria, UA MANY (*)    All other components within normal limits  CSF CELL COUNT WITH DIFFERENTIAL - Abnormal; Notable for the following:    RBC Count, CSF 7 (*)    WBC, CSF 13 (*)    Lymphs, CSF 98 (*)    Monocyte-Macrophage-Spinal Fluid 2 (*)    All other components within normal limits  VITAMIN B12 - Abnormal; Notable for the following:    Vitamin B-12 1459 (*)    All other components within normal limits  BASIC METABOLIC PANEL - Abnormal; Notable for the following:    Glucose, Bld 118 (*)    BUN 3 (*)    Calcium 8.3 (*)    All other components within normal limits  CSF CULTURE  GRAM STAIN  URINE CULTURE  ETHANOL  TSH  T3, FREE  GLUCOSE, CSF  PROTEIN, CSF  TSH  HERPES SIMPLEX VIRUS(HSV) DNA BY PCR  PATHOLOGIST SMEAR REVIEW  VDRL, CSF  ANGIOTENSIN CONVERTING ENZYME, CSF  HIV ANTIBODY (ROUTINE TESTING)  B. BURGDORFI ANTIBODIES, CSF  OLIGOCLONAL BANDS, CSF + SERM   Dg Chest 2 View  08/17/2012  *RADIOLOGY REPORT*  Clinical Data: Confusion.  Cough.  CHEST - 2 VIEW  Comparison: None.  Findings: There are low lung volumes.  The heart size and mediastinal contours are normal.  There is patchy airspace disease medially at the right lung base, best seen on the frontal examination.  The left lung is clear.  There is no pleural effusion or pneumothorax.  Telemetry leads overlie the chest.  Degenerative changes are present throughout the thoracic spine.  IMPRESSION: Patchy right basilar air space, suspicious for possible  aspiration in this clinical context.   Original Report Authenticated By: Carey Bullocks, M.D.    Ct Head Wo Contrast  08/17/2012  *RADIOLOGY REPORT*  Clinical Data: Altered mental status with speech changes.  CT HEAD WITHOUT CONTRAST  Technique:  Contiguous axial images were obtained from the base of the skull through the vertex without contrast.  Comparison: None.  Findings: There is a low density throughout the right temporal lobe with volume loss and prominence of the overlying sulci.  The left temporal lobe appears normal.  There is no evidence of acute intracranial hemorrhage, focal extra-axial fluid collection or hydrocephalus. Some relatively high density is noted superior to the right petrous apex  along the posterior aspect of this temporal lobe low density.  This likely represents volume averaging with adjacent normal brain tissue, although could indicate a meningioma.  Mucosal thickening is present throughout the ethmoid sinuses. There are air-fluid levels in the maxillary and sphenoid sinuses. The mastoids and middle ears are clear.  The calvarium is intact.  IMPRESSION:  1.  Abnormal process in the right temporal lobe has a chronic component manifesting as volume loss and may be the sequela of prior infarction or trauma.  However, an acute superimposed process such as herpes encephalitis cannot be completely excluded in this clinical context. 2.  Possible meningioma over the right petrous apex. 3.  No evidence of acute intracranial hemorrhage or positive mass effect. 4.  Sinusitis.  MRI of the brain without and with contrast is recommended for further evaluation. These results were called by telephone on 08/17/2012 at 1545 hours to Dr. Rosalia Hammers, who verbally acknowledged these results.   Original Report Authenticated By: Carey Bullocks, M.D.    Mr Laqueta Jean Wo Contrast  08/17/2012  *RADIOLOGY REPORT*  Clinical Data: Altered mental status.  MRI HEAD WITHOUT AND WITH CONTRAST  Technique:  Multiplanar,  multiecho pulse sequences of the brain and surrounding structures were obtained according to standard protocol without and with intravenous contrast  Contrast: 18mL MULTIHANCE GADOBENATE DIMEGLUMINE 529 MG/ML IV SOLN  Comparison: 08/17/2012 CT.  Findings: As best appreciated on FLAIR sequence (series 6) is abnormal signal of the right temporal lobe (including involvement of the right hippocampus and uncus).  Abnormal signal also noted in the sub insular region bilaterally as well as the medial subfrontal region.  This distribution of changes raises possibility of herpes encephalitis.  As there is atrophy of the anterior right temporal lobe, it is possible the patient had herpes in the past and what is noted on the current exam represents sequelae of prior infection however, active infection cannot be excluded by MR imaging. No restricted motion as can be seen with acute herpes.  Confounding factor is that there is a 1.3 x 1.5 x 1.6 cm enhancing mass lateral aspect of the mid temporal region. It is difficult to state with certainty that this is extra-axial in location as may be expected with a meningioma.  High resolution thin section T2- weighted imaging would prove helpful to determine if this is extra- axial and exclude possibility of this representing an intra-axial lesion.  No acute infarct.  No intracranial hemorrhage.  No hydrocephalus.  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  Major intracranial vascular structures are patent.  Degenerative changes cervical spine most notable C5-6 with slight cord flattening.  Paranasal sinus mucosal thickening / opacification most notable involving the ethmoid sinus air cells, left sphenoid sinus air cell and maxillary sinuses where there are air fluid levels which may indicate acute sinusitis.  IMPRESSION: Abnormal MRI scan may represent combination of result of prior herpes encephalitis and incidentally detected right temporal region meningioma. Active  herpes encephalitis cannot be entirely excluded by MR imaging.   High resolution T2-weighted imaging through the right temporal lobe mass would prove helpful to determine if this is extra-axial in location.  Please see above discussion.  Paranasal sinus opacification with air fluid levels raising the possibility of acute sinusitis.  Critical Value/emergent results were called by telephone at the time of interpretation on 08/17/2012 at 5:40 p.m. to Dr. Silverio Lay, who verbally acknowledged these results.   Original Report Authenticated By: Lacy Duverney, M.D.  1. Herpes encephalitis   2. Community acquired pneumonia   3. UTI (urinary tract infection)   4. Altered mental status   5. CAP (community acquired pneumonia)   6. Hypothyroidism       MDM  See separate note for MDM. This is a procedure note for LP.         Richardean Canal, MD 08/18/12 [phone]

## 2012-08-18 NOTE — Progress Notes (Signed)
Subjective: No complaints. No changes in mental status reported since admission with continual confusion.  Objective: Current vital signs: BP 122/70  Pulse 70  Temp(Src) 97.7 F (36.5 C) (Oral)  Resp 20  Ht 5\' 1"  (1.549 m)  Wt 87.998 kg (194 lb)  BMI 36.67 kg/m2  SpO2 98%  Neurologic Exam: Alert and oriented to place but disoriented to time, including month and year. Extraocular movements were full and conjugate no nystagmus. Visual fields were intact and normal. No facial weakness noted. Speech was normal with no dysarthria. Motor exam is normal with normal strength and tone throughout.  Lab Results: Results for orders placed during the hospital encounter of 08/17/12 (from the past 48 hour(s))  CBC     Status: Abnormal   Collection Time    08/17/12  1:41 PM      Result Value Range   WBC 3.4 (*) 4.0 - 10.5 K/uL   RBC 4.46  3.87 - 5.11 MIL/uL   Hemoglobin 12.2  12.0 - 15.0 g/dL   HCT 45.4 (*) 09.8 - 11.9 %   MCV 78.7  78.0 - 100.0 fL   MCH 27.4  26.0 - 34.0 pg   MCHC 34.8  30.0 - 36.0 g/dL   RDW 14.7  82.9 - 56.2 %   Platelets 208  150 - 400 K/uL  COMPREHENSIVE METABOLIC PANEL     Status: Abnormal   Collection Time    08/17/12  1:41 PM      Result Value Range   Sodium 131 (*) 135 - 145 mEq/L   Potassium 2.8 (*) 3.5 - 5.1 mEq/L   Chloride 95 (*) 96 - 112 mEq/L   CO2 24  19 - 32 mEq/L   Glucose, Bld 98  70 - 99 mg/dL   BUN 13  6 - 23 mg/dL   Creatinine, Ser 1.30  0.50 - 1.10 mg/dL   Calcium 8.8  8.4 - 86.5 mg/dL   Total Protein 6.1  6.0 - 8.3 g/dL   Albumin 3.0 (*) 3.5 - 5.2 g/dL   AST 43 (*) 0 - 37 U/L   ALT 27  0 - 35 U/L   Alkaline Phosphatase 76  39 - 117 U/L   Total Bilirubin 0.4  0.3 - 1.2 mg/dL   GFR calc non Af Amer >90  >90 mL/min   GFR calc Af Amer >90  >90 mL/min   Comment:            The eGFR has been calculated     using the CKD EPI equation.     This calculation has not been     validated in all clinical     situations.     eGFR's  persistently     <90 mL/min signify     possible Chronic Kidney Disease.  ETHANOL     Status: None   Collection Time    08/17/12  1:41 PM      Result Value Range   Alcohol, Ethyl (B) <11  0 - 11 mg/dL   Comment:            LOWEST DETECTABLE LIMIT FOR     SERUM ALCOHOL IS 11 mg/dL     FOR MEDICAL PURPOSES ONLY  TSH     Status: None   Collection Time    08/17/12  2:42 PM      Result Value Range   TSH 1.509  0.350 - 4.500 uIU/mL  T3, FREE     Status: None  Collection Time    08/17/12  2:42 PM      Result Value Range   T3, Free 2.6  2.3 - 4.2 pg/mL  URINE RAPID DRUG SCREEN (HOSP PERFORMED)     Status: Abnormal   Collection Time    08/17/12  4:07 PM      Result Value Range   Opiates POSITIVE (*) NONE DETECTED   Cocaine NONE DETECTED  NONE DETECTED   Benzodiazepines POSITIVE (*) NONE DETECTED   Amphetamines POSITIVE (*) NONE DETECTED   Tetrahydrocannabinol NONE DETECTED  NONE DETECTED   Barbiturates NONE DETECTED  NONE DETECTED   Comment:            DRUG SCREEN FOR MEDICAL PURPOSES     ONLY.  IF CONFIRMATION IS NEEDED     FOR ANY PURPOSE, NOTIFY LAB     WITHIN 5 DAYS.                LOWEST DETECTABLE LIMITS     FOR URINE DRUG SCREEN     Drug Class       Cutoff (ng/mL)     Amphetamine      1000     Barbiturate      200     Benzodiazepine   200     Tricyclics       300     Opiates          300     Cocaine          300     THC              50  URINALYSIS, ROUTINE W REFLEX MICROSCOPIC     Status: Abnormal   Collection Time    08/17/12  4:07 PM      Result Value Range   Color, Urine AMBER (*) YELLOW   Comment: BIOCHEMICALS MAY BE AFFECTED BY COLOR   APPearance TURBID (*) CLEAR   Specific Gravity, Urine 1.030  1.005 - 1.030   pH 6.0  5.0 - 8.0   Glucose, UA NEGATIVE  NEGATIVE mg/dL   Hgb urine dipstick SMALL (*) NEGATIVE   Bilirubin Urine SMALL (*) NEGATIVE   Ketones, ur NEGATIVE  NEGATIVE mg/dL   Protein, ur 30 (*) NEGATIVE mg/dL   Urobilinogen, UA 1.0  0.0 - 1.0  mg/dL   Nitrite NEGATIVE  NEGATIVE   Leukocytes, UA LARGE (*) NEGATIVE  URINE MICROSCOPIC-ADD ON     Status: Abnormal   Collection Time    08/17/12  4:07 PM      Result Value Range   Squamous Epithelial / LPF RARE  RARE   WBC, UA TOO NUMEROUS TO COUNT  <3 WBC/hpf   RBC / HPF 0-2  <3 RBC/hpf   Bacteria, UA MANY (*) RARE   Urine-Other LESS THAN 10 mL OF URINE SUBMITTED     Comment: MICROSCOPIC EXAM PERFORMED ON UNCONCENTRATED URINE     MUCOUS PRESENT  GRAM STAIN     Status: None   Collection Time    08/17/12  7:05 PM      Result Value Range   Specimen Description CSF     Special Requests NONE     Gram Stain       Value: NO ORGANISMS SEEN     WBC PRESENT, PREDOMINANTLY MONONUCLEAR     CYTOSPIN SMEAR     Gram Stain Report Called to,Read Back By and Verified With: Lizbeth Bark [id number] @ 2025 BY J SCOTTON  Report Status 08/17/2012 FINAL    CSF CELL COUNT WITH DIFFERENTIAL     Status: Abnormal   Collection Time    08/17/12  7:06 PM      Result Value Range   Tube # 4     Comment: CELL COUNT NOT NEEDED ON TUBE 1 PER OMALLEY, E. AT 2025 ON 05.03.14 SCOTTON, J.     CORRECTED ON 05/03 AT 2045: PREVIOUSLY REPORTED AS 1 and 4   Color, CSF COLORLESS  COLORLESS   Appearance, CSF CLEAR  CLEAR   Supernatant NOT INDICATED     RBC Count, CSF 7 (*) 0 /cu mm   WBC, CSF 13 (*) 0 - 5 /cu mm   Comment: CRITICAL RESULT CALLED TO, READ BACK BY AND VERIFIED WITH:     Lizbeth Bark [id number] @ 2025 BY J SCOTTON   Segmented Neutrophils-CSF 0  0 - 6 %   Comment: PERFORMED ON TUBE 2 DUE TO INSUFFICIENT VOLUME IN TUBE 4   Lymphs, CSF 98 (*) 40 - 80 %   Comment: PERFORMED ON TUBE 2 DUE TO INSUFFICIENT VOLUME IN TUBE 4   Monocyte-Macrophage-Spinal Fluid 2 (*) 15 - 45 %   Comment: PERFORMED ON TUBE 2 DUE TO INSUFFICIENT VOLUME IN TUBE 4   Eosinophils, CSF    0 - 1 %   Value: PERFORMED ON TUBE 2 DUE TO INSUFFICIENT VOLUME IN TUBE 4  GLUCOSE, CSF     Status: None   Collection Time    08/17/12  7:06 PM       Result Value Range   Glucose, CSF 57  43 - 76 mg/dL  PROTEIN, CSF     Status: None   Collection Time    08/17/12  7:06 PM      Result Value Range   Total  Protein, CSF 26  15 - 45 mg/dL  CSF CULTURE     Status: None   Collection Time    08/17/12  7:06 PM      Result Value Range   Specimen Description CSF     Special Requests NONE     Gram Stain       Value: CYTOSPIN SMEAR WBC PRESENT, PREDOMINANTLY MONONUCLEAR     NO ORGANISMS SEEN     Gram Stain Report Called to,Read Back By and Verified With: Gram Stain Report Called to,Read Back By and Verified With: Margarita Rana PA [id number] @ 2025 BY J SCOTTON Performed by [facility]   Culture PENDING     Report Status PENDING      Studies/Results: Dg Chest 2 View  08/17/2012  *RADIOLOGY REPORT*  Clinical Data: Confusion.  Cough.  CHEST - 2 VIEW  Comparison: None.  Findings: There are low lung volumes.  The heart size and mediastinal contours are normal.  There is patchy airspace disease medially at the right lung base, best seen on the frontal examination.  The left lung is clear.  There is no pleural effusion or pneumothorax.  Telemetry leads overlie the chest.  Degenerative changes are present throughout the thoracic spine.  IMPRESSION: Patchy right basilar air space, suspicious for possible aspiration in this clinical context.   Original Report Authenticated By: Carey Bullocks, M.D.    Ct Head Wo Contrast  08/17/2012  *RADIOLOGY REPORT*  Clinical Data: Altered mental status with speech changes.  CT HEAD WITHOUT CONTRAST  Technique:  Contiguous axial images were obtained from the base of the skull through the vertex without contrast.  Comparison: None.  Findings: There is a low density throughout the right temporal lobe with volume loss and prominence of the overlying sulci.  The left temporal lobe appears normal.  There is no evidence of acute intracranial hemorrhage, focal extra-axial fluid collection or hydrocephalus. Some relatively  high density is noted superior to the right petrous apex along the posterior aspect of this temporal lobe low density.  This likely represents volume averaging with adjacent normal brain tissue, although could indicate a meningioma.  Mucosal thickening is present throughout the ethmoid sinuses. There are air-fluid levels in the maxillary and sphenoid sinuses. The mastoids and middle ears are clear.  The calvarium is intact.  IMPRESSION:  1.  Abnormal process in the right temporal lobe has a chronic component manifesting as volume loss and may be the sequela of prior infarction or trauma.  However, an acute superimposed process such as herpes encephalitis cannot be completely excluded in this clinical context. 2.  Possible meningioma over the right petrous apex. 3.  No evidence of acute intracranial hemorrhage or positive mass effect. 4.  Sinusitis.  MRI of the brain without and with contrast is recommended for further evaluation. These results were called by telephone on 08/17/2012 at 1545 hours to Dr. Rosalia Hammers, who verbally acknowledged these results.   Original Report Authenticated By: Carey Bullocks, M.D.    Mr Laqueta Jean Wo Contrast  08/17/2012  *RADIOLOGY REPORT*  Clinical Data: Altered mental status.  MRI HEAD WITHOUT AND WITH CONTRAST  Technique:  Multiplanar, multiecho pulse sequences of the brain and surrounding structures were obtained according to standard protocol without and with intravenous contrast  Contrast: 18mL MULTIHANCE GADOBENATE DIMEGLUMINE 529 MG/ML IV SOLN  Comparison: 08/17/2012 CT.  Findings: As best appreciated on FLAIR sequence (series 6) is abnormal signal of the right temporal lobe (including involvement of the right hippocampus and uncus).  Abnormal signal also noted in the sub insular region bilaterally as well as the medial subfrontal region.  This distribution of changes raises possibility of herpes encephalitis.  As there is atrophy of the anterior right temporal lobe, it is possible the  patient had herpes in the past and what is noted on the current exam represents sequelae of prior infection however, active infection cannot be excluded by MR imaging. No restricted motion as can be seen with acute herpes.  Confounding factor is that there is a 1.3 x 1.5 x 1.6 cm enhancing mass lateral aspect of the mid temporal region. It is difficult to state with certainty that this is extra-axial in location as may be expected with a meningioma.  High resolution thin section T2- weighted imaging would prove helpful to determine if this is extra- axial and exclude possibility of this representing an intra-axial lesion.  No acute infarct.  No intracranial hemorrhage.  No hydrocephalus.  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  Major intracranial vascular structures are patent.  Degenerative changes cervical spine most notable C5-6 with slight cord flattening.  Paranasal sinus mucosal thickening / opacification most notable involving the ethmoid sinus air cells, left sphenoid sinus air cell and maxillary sinuses where there are air fluid levels which may indicate acute sinusitis.  IMPRESSION: Abnormal MRI scan may represent combination of result of prior herpes encephalitis and incidentally detected right temporal region meningioma. Active herpes encephalitis cannot be entirely excluded by MR imaging.   High resolution T2-weighted imaging through the right temporal lobe mass would prove helpful to determine if this is extra-axial in location.  Please see above discussion.  Paranasal sinus opacification with air fluid levels raising the possibility of acute sinusitis.  Critical Value/emergent results were called by telephone at the time of interpretation on 08/17/2012 at 5:40 p.m. to Dr. Silverio Lay, who verbally acknowledged these results.   Original Report Authenticated By: Lacy Duverney, M.D.     Medications: I have reviewed the patient's current medications.  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 It's unclear whether patient is still experiencing active HSV involvement at this point. No change in mental status may well be associated with exacerbation of baseline cognitive deficits with acute infectious process.  Recommendations: No changes in current management including continuing acyclovir. If HSV PCR is negative acyclovir may be discontinued. I will plan to obtain an EEG early this week to assess severity of encephalopathy as well as to rule out focal epileptic type activity.  C.R. Roseanne Reno, MD [facility] [phone]  08/18/2012  9:54 AM

## 2012-08-18 NOTE — Telephone Encounter (Signed)
Pull chart please 

## 2012-08-19 ENCOUNTER — Inpatient Hospital Stay (HOSPITAL_COMMUNITY): Payer: BC Managed Care – PPO

## 2012-08-19 ENCOUNTER — Inpatient Hospital Stay (HOSPITAL_COMMUNITY)
Admit: 2012-08-19 | Discharge: 2012-08-19 | Disposition: A | Discharge status: Home / Self Care | Payer: BC Managed Care – PPO | Attending: Internal Medicine | Admitting: Internal Medicine

## 2012-08-19 DIAGNOSIS — G9340 Encephalopathy, unspecified: Secondary | ICD-10-CM

## 2012-08-19 LAB — VDRL, CSF: VDRL Quant, CSF: NONREACTIVE

## 2012-08-19 LAB — HERPES SIMPLEX VIRUS(HSV) DNA BY PCR: HSV 2 DNA: NOT DETECTED

## 2012-08-19 MED ORDER — LORAZEPAM 2 MG/ML IJ SOLN
1.0000 mg | Freq: Once | INTRAMUSCULAR | Status: AC
  Administered 2012-08-19: 1 mg via INTRAVENOUS
  Filled 2012-08-19: qty 1

## 2012-08-19 NOTE — Telephone Encounter (Signed)
[id number] in PA pool at nurse's station.

## 2012-08-19 NOTE — Progress Notes (Signed)
Nutrition Brief Note  Patient identified on the Malnutrition Screening Tool (MST) Report  Body mass index is 36.67 kg/(m^2). Patient meets criteria for class II obesity based on current BMI.   Wt Readings from Last 5 Encounters:  08/17/12 194 lb (87.998 kg)  08/12/12 190 lb (86.183 kg)  08/07/12 192 lb 9.6 oz (87.363 kg)  02/29/12 202 lb (91.627 kg)  08/08/11 196 lb (88.905 kg)    Current diet order is dysphagia 3, thin, patient is consuming approximately 75-100% of meals at this time. Labs and medications reviewed. Pt's weight stable PTA. Pt asleep during visit, did not disturb. Would likely be difficult to obtain reliable information anyway r/t pt admitted with altered mental status and has encephalopathy and persistent confusion since admission. Will continue to monitor intake.   No nutrition interventions warranted at this time. If nutrition issues arise, please consult RD.   Levon Hedger MS, RD, LDN [phone] Pager [phone] After Hours Pager

## 2012-08-19 NOTE — Progress Notes (Addendum)
History: Emily Steele is a 57 y.o. female who has been increasingly confused over the past couple of weeks. The brother has seen her only intermittently over the past year do to her being in [place] taking care of their mother who was sick.   Apparently, in late February or early March she had an episode where she was complaining of headache and became very fatigued spending several days in her room. At that time, she was taken to the emergency room by family member, but checked out AMA prior to her diagnosis being given. It is unclear to me if she was febrile at that time.   She got back to [place] on April 13, and the following weekend she was maybe exhibiting some mild confusion. She got lost in an area where she should have known where she was going, and seemed to have some problems with her memory. More recently this has become very pronounced and she is having difficulty with organizing her medications. Apparently since March, she is only sporadically been able to take her medications as prescribed   Subjective:  Patient says she has had headaches and does not feel like eating. She is worried about her belongings at home. According to staff, this is not new and is consistent with her persistent confusion.   Objective: BP 125/86  Pulse 67  Temp(Src) 98.6 F (37 C) (Oral)  Resp 18  Ht 5\' 1"  (1.549 m)  Wt 87.998 kg (194 lb)  BMI 36.67 kg/m2  SpO2 97%  CBGs No results found for this basename: GLUCAP,  in the last 72 hours   Medications: Scheduled: . acyclovir  10 mg/kg (Ideal) Intravenous Q8H  . azithromycin  500 mg Intravenous Q24H  . buPROPion  150 mg Oral BID  . cefTRIAXone (ROCEPHIN)  IV  1 g Intravenous Q24H  . DULoxetine  60 mg Oral BID  . gabapentin  100 mg Oral TID  . levothyroxine  50 mcg Oral QAC breakfast  . potassium chloride  40 mEq Oral Daily  . sodium chloride  3 mL Intravenous Q12H  . sodium chloride  3 mL Intravenous Q12H  . vancomycin  1,000 mg  Intravenous Q12H    Neurologic Exam: Alert and oriented to place but remains disoriented to time, including month and year. She is fearful that her things at home are being stolen. Speech was normal with no dysarthria. Extraocular movements were full and with no nystagmus.  Visual fields were intact and normal.  No facial weakness noted.  Motor exam is normal with normal strength and tone throughout. No sensory loss in face or extremities bilaterally.  Lab Results: CBC:  Recent Labs Lab 08/12/12 2126 08/17/12 1341  WBC 6.0 3.4*  HGB 13.9 12.2  HCT 43.6 35.1*  MCV 85.5 78.7  PLT  --  208   Basic Metabolic Panel:  Recent Labs Lab 08/17/12 1341 08/18/12 0911  NA 131* 136  K 2.8* 3.9  CL 95* 105  CO2 24 21  GLUCOSE 98 118*  BUN 13 3*  CREATININE 0.69 0.51  CALCIUM 8.8 8.3*   Liver Function Tests:  Recent Labs Lab 08/17/12 1341  AST 43*  ALT 27  ALKPHOS 76  BILITOT 0.4  PROT 6.1  ALBUMIN 3.0*   Hemoglobin A1C: No results found for this basename: HGBA1C,  in the last 168 hours Fasting Lipid Panel: No results found for this basename: CHOL, HDL, LDLCALC, TRIG, CHOLHDL, LDLDIRECT,  in the last 168 hours Thyroid Function Tests:  Recent Labs Lab 08/17/12 1442 08/18/12 0530  TSH 1.509 1.815  T3FREE 2.6  --    Coagulation: No results found for this basename: LABPROT, INR,  in the last 168 hours   Study Results: Dg Chest 2 View  08/17/2012  *RADIOLOGY REPORT*  Clinical Data: Confusion.  Cough.  CHEST - 2 VIEW  Comparison: None.  Findings: There are low lung volumes.  The heart size and mediastinal contours are normal.  There is patchy airspace disease medially at the right lung base, best seen on the frontal examination.  The left lung is clear.  There is no pleural effusion or pneumothorax.  Telemetry leads overlie the chest.  Degenerative changes are present throughout the thoracic spine.  IMPRESSION: Patchy right basilar air space, suspicious for possible  aspiration in this clinical context.   Original Report Authenticated By: Carey Bullocks, M.D.    Ct Head Wo Contrast  08/17/2012  *RADIOLOGY REPORT*  Clinical Data: Altered mental status with speech changes.  CT HEAD WITHOUT CONTRAST  Technique:  Contiguous axial images were obtained from the base of the skull through the vertex without contrast.  Comparison: None.  Findings: There is a low density throughout the right temporal lobe with volume loss and prominence of the overlying sulci.  The left temporal lobe appears normal.  There is no evidence of acute intracranial hemorrhage, focal extra-axial fluid collection or hydrocephalus. Some relatively high density is noted superior to the right petrous apex along the posterior aspect of this temporal lobe low density.  This likely represents volume averaging with adjacent normal brain tissue, although could indicate a meningioma.  Mucosal thickening is present throughout the ethmoid sinuses. There are air-fluid levels in the maxillary and sphenoid sinuses. The mastoids and middle ears are clear.  The calvarium is intact.  IMPRESSION:  1.  Abnormal process in the right temporal lobe has a chronic component manifesting as volume loss and may be the sequela of prior infarction or trauma.  However, an acute superimposed process such as herpes encephalitis cannot be completely excluded in this clinical context. 2.  Possible meningioma over the right petrous apex. 3.  No evidence of acute intracranial hemorrhage or positive mass effect. 4.  Sinusitis.  MRI of the brain without and with contrast is recommended for further evaluation. These results were called by telephone on 08/17/2012 at 1545 hours to Dr. Rosalia Hammers, who verbally acknowledged these results.   Original Report Authenticated By: Carey Bullocks, M.D.    Mr Laqueta Jean Wo Contrast  08/17/2012  *RADIOLOGY REPORT*  Clinical Data: Altered mental status.  MRI HEAD WITHOUT AND WITH CONTRAST  Technique:  Multiplanar,  multiecho pulse sequences of the brain and surrounding structures were obtained according to standard protocol without and with intravenous contrast  Contrast: 18mL MULTIHANCE GADOBENATE DIMEGLUMINE 529 MG/ML IV SOLN  Comparison: 08/17/2012 CT.  Findings: As best appreciated on FLAIR sequence (series 6) is abnormal signal of the right temporal lobe (including involvement of the right hippocampus and uncus).  Abnormal signal also noted in the sub insular region bilaterally as well as the medial subfrontal region.  This distribution of changes raises possibility of herpes encephalitis.  As there is atrophy of the anterior right temporal lobe, it is possible the patient had herpes in the past and what is noted on the current exam represents sequelae of prior infection however, active infection cannot be excluded by MR imaging. No restricted motion as can be seen with acute herpes.  Confounding factor is that  there is a 1.3 x 1.5 x 1.6 cm enhancing mass lateral aspect of the mid temporal region. It is difficult to state with certainty that this is extra-axial in location as may be expected with a meningioma.  High resolution thin section T2- weighted imaging would prove helpful to determine if this is extra- axial and exclude possibility of this representing an intra-axial lesion.  No acute infarct.  No intracranial hemorrhage.  No hydrocephalus.  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  Major intracranial vascular structures are patent.  Degenerative changes cervical spine most notable C5-6 with slight cord flattening.  Paranasal sinus mucosal thickening / opacification most notable involving the ethmoid sinus air cells, left sphenoid sinus air cell and maxillary sinuses where there are air fluid levels which may indicate acute sinusitis.  IMPRESSION: Abnormal MRI scan may represent combination of result of prior herpes encephalitis and incidentally detected right temporal region meningioma. Active  herpes encephalitis cannot be entirely excluded by MR imaging.   High resolution T2-weighted imaging through the right temporal lobe mass would prove helpful to determine if this is extra-axial in location.  Please see above discussion.  Paranasal sinus opacification with air fluid levels raising the possibility of acute sinusitis.  Critical Value/emergent results were called by telephone at the time of interpretation on 08/17/2012 at 5:40 p.m. to Dr. Silverio Lay, who verbally acknowledged these results.   Original Report Authenticated By: Lacy Duverney, M.D.       Assessment/Plan: 57 yo female with progressive cognitive abnormalities over the past several months felt to be probable herpes encephalitis although CFS is still pending. She remains on acyclovir as well as broad spectrum antibacterial coverage. Since she does still have confusion despite treatment, she may have an underlying dementia that was unknown prior.  Await HSV PCR. EEG.  I suspect that she will not be able to go home from this hospitalization and will need to be placed. I spent 40 min with this patient, > 50% reviewing case with her, nursing staff.    LOS: 2 days  Job Founds, MBA, [facility] Pager [phone] 08/19/2012  12:26 PM

## 2012-08-19 NOTE — Progress Notes (Signed)
Offsite EEG completed at [facility]. 

## 2012-08-19 NOTE — Telephone Encounter (Signed)
Looks like pt is currently hospitalized for possible HSV encephalitis, CAP, and UTI.  On review of chart, pt was seen here 08/12/12 and rx'd azithromycin for coverage of CAP - this was apparently sent to mail order and pt had not been taking it.  Telephone note dated 08/13/12 indicates that medications were re-sent to pharmacy, including hydrocodone cough syrup.  Per ED notes pt brought in 08/17/12 by family members for AMS, now admitted.  I do not think it is appropriate to refill cough syrup as pt is currently admitted.

## 2012-08-19 NOTE — Progress Notes (Signed)
[facility] PROGRESS NOTE  Emily Steele UUV:[id number] DOB: 09-03-1955 DOA: 08/17/2012 PCP: Dow Adolph, MD  Assessment/Plan: 1. Acute encephalopathy: possibly a combination of infectious and non infectious causes from drug use.  - LP done and CSF analysis pending.  - vancomycin to be added as per discussion with Dr snider.  - MRI with thin slides pending and to be scheduled today - EEG ordered and pending.  - ID /NEUROon board to assist [place].   2. Depression: resume home medications.   3. CAP; resume rocephin and zithromax.   4. Hypokalemia: will be repleted as needed.   5. Abnormal UA: Cultures show 25,000 with multiple bacterial morphotypes  6. Abnormal UDS: contributing to the encephalopathy.   7. Hypothyroidism; resume synthroid.   DVT prophylaxis.   Code Status: full code Family Communication:  None at bedside Disposition Plan: pending   Consultants:  Neurology  ID    Antibiotics:  ROCEPHIN   ACYCLOVIR.  Vancomycin.  HPI/Subjective: Appears comfortable, and is oriented to person only.  As per the nurse, she is always trying to get out of bed.   Objective: Filed Vitals:   08/18/12 0600 08/18/12 1346 08/18/12 2200 08/19/12 0600  BP: 122/70 114/77 129/87 125/86  Pulse: 70 75 70 67  Temp: 97.7 F (36.5 C) 98.1 F (36.7 C) 98 F (36.7 C) 98.6 F (37 C)  TempSrc: Oral Oral Oral Oral  Resp: 20 20 18 18   Height:      Weight:      SpO2: 98% 95% 96% 97%    Intake/Output Summary (Last 24 hours) at 08/19/12 1118 Last data filed at 08/19/12 0907  Gross per 24 hour  Intake    600 ml  Output      0 ml  Net    600 ml   Filed Weights   08/17/12 2055 08/17/12 2300  Weight: 88.451 kg (195 lb) 87.998 kg (194 lb)    Exam: She is alert afebrile and oriented to person ONLY. CHEST: Normal respiration, clear to auscultation bilaterally.  HEART: Regular rate and rhythm. There are no murmur, rub, or gallops.  BACK: No kyphosis or scoliosis; no  CVA tenderness  ABDOMEN: soft and non-tender; no masses, no organomegaly, normal abdominal bowel sounds; no pannus; no intertriginous candida. There is no rebound and no distention.  EXTREMITIES: No bone or joint deformity; age-appropriate arthropathy of the hands and knees; no edema; no ulcerations. There is no calf tendernes NEURO: no motor deficits. No facial asymmetry.    Data Reviewed: Basic Metabolic Panel:  Recent Labs Lab 08/17/12 1341 08/18/12 0911  NA 131* 136  K 2.8* 3.9  CL 95* 105  CO2 24 21  GLUCOSE 98 118*  BUN 13 3*  CREATININE 0.69 0.51  CALCIUM 8.8 8.3*   Liver Function Tests:  Recent Labs Lab 08/17/12 1341  AST 43*  ALT 27  ALKPHOS 76  BILITOT 0.4  PROT 6.1  ALBUMIN 3.0*   No results found for this basename: LIPASE, AMYLASE,  in the last 168 hours No results found for this basename: AMMONIA,  in the last 168 hours CBC:  Recent Labs Lab 08/12/12 2126 08/17/12 1341  WBC 6.0 3.4*  HGB 13.9 12.2  HCT 43.6 35.1*  MCV 85.5 78.7  PLT  --  208   Cardiac Enzymes: No results found for this basename: CKTOTAL, CKMB, CKMBINDEX, TROPONINI,  in the last 168 hours BNP (last 3 results) No results found for this basename: PROBNP,  in the last 8760  hours CBG: No results found for this basename: GLUCAP,  in the last 168 hours  Recent Results (from the past 240 hour(s))  URINE CULTURE     Status: None   Collection Time    08/17/12  4:07 PM      Result Value Range Status   Specimen Description URINE, CLEAN CATCH   Final   Special Requests NONE   Final   Culture  Setup Time 08/17/2012 20:06   Final   Colony Count 25,000 COLONIES/ML   Final   Culture     Final   Value: Multiple bacterial morphotypes present, none predominant. Suggest appropriate recollection if clinically indicated.   Report Status 08/18/2012 FINAL   Final  GRAM STAIN     Status: None   Collection Time    08/17/12  7:05 PM      Result Value Range Status   Specimen Description CSF    Final   Special Requests NONE   Final   Gram Stain     Final   Value: NO ORGANISMS SEEN     WBC PRESENT, PREDOMINANTLY MONONUCLEAR     CYTOSPIN SMEAR     Gram Stain Report Called to,Read Back By and Verified With: Lizbeth Bark [id number] @ 2025 BY J SCOTTON   Report Status 08/17/2012 FINAL   Final  CSF CULTURE     Status: None   Collection Time    08/17/12  7:06 PM      Result Value Range Status   Specimen Description CSF   Final   Special Requests NONE   Final   Gram Stain     Final   Value: CYTOSPIN SMEAR WBC PRESENT, PREDOMINANTLY MONONUCLEAR     NO ORGANISMS SEEN     Gram Stain Report Called to,Read Back By and Verified With: Gram Stain Report Called to,Read Back By and Verified With: Margarita Rana PA [id number] @ 2025 BY J SCOTTON Performed by [facility]   Culture PENDING   Incomplete   Report Status PENDING   Incomplete     Studies: Dg Chest 2 View  08/17/2012  *RADIOLOGY REPORT*  Clinical Data: Confusion.  Cough.  CHEST - 2 VIEW  Comparison: None.  Findings: There are low lung volumes.  The heart size and mediastinal contours are normal.  There is patchy airspace disease medially at the right lung base, best seen on the frontal examination.  The left lung is clear.  There is no pleural effusion or pneumothorax.  Telemetry leads overlie the chest.  Degenerative changes are present throughout the thoracic spine.  IMPRESSION: Patchy right basilar air space, suspicious for possible aspiration in this clinical context.   Original Report Authenticated By: Carey Bullocks, M.D.    Ct Head Wo Contrast  08/17/2012  *RADIOLOGY REPORT*  Clinical Data: Altered mental status with speech changes.  CT HEAD WITHOUT CONTRAST  Technique:  Contiguous axial images were obtained from the base of the skull through the vertex without contrast.  Comparison: None.  Findings: There is a low density throughout the right temporal lobe with volume loss and prominence of the overlying sulci.  The left temporal  lobe appears normal.  There is no evidence of acute intracranial hemorrhage, focal extra-axial fluid collection or hydrocephalus. Some relatively high density is noted superior to the right petrous apex along the posterior aspect of this temporal lobe low density.  This likely represents volume averaging with adjacent normal brain tissue, although could indicate a meningioma.  Mucosal thickening is  present throughout the ethmoid sinuses. There are air-fluid levels in the maxillary and sphenoid sinuses. The mastoids and middle ears are clear.  The calvarium is intact.  IMPRESSION:  1.  Abnormal process in the right temporal lobe has a chronic component manifesting as volume loss and may be the sequela of prior infarction or trauma.  However, an acute superimposed process such as herpes encephalitis cannot be completely excluded in this clinical context. 2.  Possible meningioma over the right petrous apex. 3.  No evidence of acute intracranial hemorrhage or positive mass effect. 4.  Sinusitis.  MRI of the brain without and with contrast is recommended for further evaluation. These results were called by telephone on 08/17/2012 at 1545 hours to Dr. Rosalia Hammers, who verbally acknowledged these results.   Original Report Authenticated By: Carey Bullocks, M.D.    Mr Laqueta Jean Wo Contrast  08/17/2012  *RADIOLOGY REPORT*  Clinical Data: Altered mental status.  MRI HEAD WITHOUT AND WITH CONTRAST  Technique:  Multiplanar, multiecho pulse sequences of the brain and surrounding structures were obtained according to standard protocol without and with intravenous contrast  Contrast: 18mL MULTIHANCE GADOBENATE DIMEGLUMINE 529 MG/ML IV SOLN  Comparison: 08/17/2012 CT.  Findings: As best appreciated on FLAIR sequence (series 6) is abnormal signal of the right temporal lobe (including involvement of the right hippocampus and uncus).  Abnormal signal also noted in the sub insular region bilaterally as well as the medial subfrontal region.   This distribution of changes raises possibility of herpes encephalitis.  As there is atrophy of the anterior right temporal lobe, it is possible the patient had herpes in the past and what is noted on the current exam represents sequelae of prior infection however, active infection cannot be excluded by MR imaging. No restricted motion as can be seen with acute herpes.  Confounding factor is that there is a 1.3 x 1.5 x 1.6 cm enhancing mass lateral aspect of the mid temporal region. It is difficult to state with certainty that this is extra-axial in location as may be expected with a meningioma.  High resolution thin section T2- weighted imaging would prove helpful to determine if this is extra- axial and exclude possibility of this representing an intra-axial lesion.  No acute infarct.  No intracranial hemorrhage.  No hydrocephalus.  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  Major intracranial vascular structures are patent.  Degenerative changes cervical spine most notable C5-6 with slight cord flattening.  Paranasal sinus mucosal thickening / opacification most notable involving the ethmoid sinus air cells, left sphenoid sinus air cell and maxillary sinuses where there are air fluid levels which may indicate acute sinusitis.  IMPRESSION: Abnormal MRI scan may represent combination of result of prior herpes encephalitis and incidentally detected right temporal region meningioma. Active herpes encephalitis cannot be entirely excluded by MR imaging.   High resolution T2-weighted imaging through the right temporal lobe mass would prove helpful to determine if this is extra-axial in location.  Please see above discussion.  Paranasal sinus opacification with air fluid levels raising the possibility of acute sinusitis.  Critical Value/emergent results were called by telephone at the time of interpretation on 08/17/2012 at 5:40 p.m. to Dr. Silverio Lay, who verbally acknowledged these results.   Original  Report Authenticated By: Lacy Duverney, M.D.     Scheduled Meds: . acyclovir  10 mg/kg (Ideal) Intravenous Q8H  . azithromycin  500 mg Intravenous Q24H  . buPROPion  150 mg Oral BID  . cefTRIAXone (ROCEPHIN)  IV  1 g Intravenous Q24H  . DULoxetine  60 mg Oral BID  . gabapentin  100 mg Oral TID  . levothyroxine  50 mcg Oral QAC breakfast  . potassium chloride  40 mEq Oral Daily  . sodium chloride  3 mL Intravenous Q12H  . sodium chloride  3 mL Intravenous Q12H  . vancomycin  1,000 mg Intravenous Q12H   Continuous Infusions:   Principal Problem:   Altered mental status Active Problems:   Major Depressive Disorder   Fibromyalgia   Hypothyroidism   Obesity   Herpetic encephalitis   CAP (community acquired pneumonia)    Time spent: 35 minutes    Gaje Tennyson  [facility] Pager [phone] If 7PM-7AM, please contact night-coverage at [email or web address], password [facility] 08/19/2012, 11:18 AM  LOS: 2 days

## 2012-08-19 NOTE — Progress Notes (Signed)
[facility]  Date of Admission:  08/17/2012  Antibiotics: Vancomycin day 2 Rocephin/azithromycin day 3 Acyclovir day 3  Subjective: + headache  Objective: Temp:  [98 F (36.7 C)-98.6 F (37 C)] 98.6 F (37 C) (05/05 0600) Pulse Rate:  [67-70] 67 (05/05 0600) Resp:  [18] 18 (05/05 0600) BP: (125-129)/(86-87) 125/86 mmHg (05/05 0600) SpO2:  [96 %-97 %] 97 % (05/05 0600)  General: Awake, alert, nad, not oriented to place (mentions town in [place]) or time  Skin: no rashes Lungs: CTA B Cor: RRR without m/r/g Abdomen: soft, nt, nd, +bs, no hsm Ext: no edema  Lab Results Lab Results  Component Value Date   WBC 3.4* 08/17/2012   HGB 12.2 08/17/2012   HCT 35.1* 08/17/2012   MCV 78.7 08/17/2012   PLT 208 08/17/2012    Lab Results  Component Value Date   CREATININE 0.51 08/18/2012   BUN 3* 08/18/2012   NA 136 08/18/2012   K 3.9 08/18/2012   CL 105 08/18/2012   CO2 21 08/18/2012    Lab Results  Component Value Date   ALT 27 08/17/2012   AST 43* 08/17/2012   ALKPHOS 76 08/17/2012   BILITOT 0.4 08/17/2012      Microbiology: Recent Results (from the past 240 hour(s))  URINE CULTURE     Status: None   Collection Time    08/17/12  4:07 PM      Result Value Range Status   Specimen Description URINE, CLEAN CATCH   Final   Special Requests NONE   Final   Culture  Setup Time 08/17/2012 20:06   Final   Colony Count 25,000 COLONIES/ML   Final   Culture     Final   Value: Multiple bacterial morphotypes present, none predominant. Suggest appropriate recollection if clinically indicated.   Report Status 08/18/2012 FINAL   Final  GRAM STAIN     Status: None   Collection Time    08/17/12  7:05 PM      Result Value Range Status   Specimen Description CSF   Final   Special Requests NONE   Final   Gram Stain     Final   Value: NO ORGANISMS SEEN     WBC PRESENT, PREDOMINANTLY MONONUCLEAR     CYTOSPIN SMEAR     Gram Stain Report Called to,Read Back By and Verified With: Lizbeth Bark [id number] @ 2025 BY J SCOTTON   Report Status 08/17/2012 FINAL   Final  CSF CULTURE     Status: None   Collection Time    08/17/12  7:06 PM      Result Value Range Status   Specimen Description CSF   Final   Special Requests NONE   Final   Gram Stain     Final   Value: CYTOSPIN SMEAR WBC PRESENT, PREDOMINANTLY MONONUCLEAR     NO ORGANISMS SEEN     Gram Stain Report Called to,Read Back By and Verified With: Gram Stain Report Called to,Read Back By and Verified With: Margarita Rana PA [id number] @ 2025 BY J SCOTTON Performed by [facility]   Culture NO GROWTH 1 DAY   Final   Report Status PENDING   Incomplete    Studies/Results: Dg Chest 2 View  08/17/2012  *RADIOLOGY REPORT*  Clinical Data: Confusion.  Cough.  CHEST - 2 VIEW  Comparison: None.  Findings: There are low lung volumes.  The heart size and mediastinal contours are normal.  There  is patchy airspace disease medially at the right lung base, best seen on the frontal examination.  The left lung is clear.  There is no pleural effusion or pneumothorax.  Telemetry leads overlie the chest.  Degenerative changes are present throughout the thoracic spine.  IMPRESSION: Patchy right basilar air space, suspicious for possible aspiration in this clinical context.   Original Report Authenticated By: Carey Bullocks, M.D.    Ct Head Wo Contrast  08/17/2012  *RADIOLOGY REPORT*  Clinical Data: Altered mental status with speech changes.  CT HEAD WITHOUT CONTRAST  Technique:  Contiguous axial images were obtained from the base of the skull through the vertex without contrast.  Comparison: None.  Findings: There is a low density throughout the right temporal lobe with volume loss and prominence of the overlying sulci.  The left temporal lobe appears normal.  There is no evidence of acute intracranial hemorrhage, focal extra-axial fluid collection or hydrocephalus. Some relatively high density is noted superior to the right petrous apex along the  posterior aspect of this temporal lobe low density.  This likely represents volume averaging with adjacent normal brain tissue, although could indicate a meningioma.  Mucosal thickening is present throughout the ethmoid sinuses. There are air-fluid levels in the maxillary and sphenoid sinuses. The mastoids and middle ears are clear.  The calvarium is intact.  IMPRESSION:  1.  Abnormal process in the right temporal lobe has a chronic component manifesting as volume loss and may be the sequela of prior infarction or trauma.  However, an acute superimposed process such as herpes encephalitis cannot be completely excluded in this clinical context. 2.  Possible meningioma over the right petrous apex. 3.  No evidence of acute intracranial hemorrhage or positive mass effect. 4.  Sinusitis.  MRI of the brain without and with contrast is recommended for further evaluation. These results were called by telephone on 08/17/2012 at 1545 hours to Dr. Rosalia Hammers, who verbally acknowledged these results.   Original Report Authenticated By: Carey Bullocks, M.D.    Mr Laqueta Jean Wo Contrast  08/17/2012  *RADIOLOGY REPORT*  Clinical Data: Altered mental status.  MRI HEAD WITHOUT AND WITH CONTRAST  Technique:  Multiplanar, multiecho pulse sequences of the brain and surrounding structures were obtained according to standard protocol without and with intravenous contrast  Contrast: 18mL MULTIHANCE GADOBENATE DIMEGLUMINE 529 MG/ML IV SOLN  Comparison: 08/17/2012 CT.  Findings: As best appreciated on FLAIR sequence (series 6) is abnormal signal of the right temporal lobe (including involvement of the right hippocampus and uncus).  Abnormal signal also noted in the sub insular region bilaterally as well as the medial subfrontal region.  This distribution of changes raises possibility of herpes encephalitis.  As there is atrophy of the anterior right temporal lobe, it is possible the patient had herpes in the past and what is noted on the current  exam represents sequelae of prior infection however, active infection cannot be excluded by MR imaging. No restricted motion as can be seen with acute herpes.  Confounding factor is that there is a 1.3 x 1.5 x 1.6 cm enhancing mass lateral aspect of the mid temporal region. It is difficult to state with certainty that this is extra-axial in location as may be expected with a meningioma.  High resolution thin section T2- weighted imaging would prove helpful to determine if this is extra- axial and exclude possibility of this representing an intra-axial lesion.  No acute infarct.  No intracranial hemorrhage.  No hydrocephalus.  Congenitally small  appearance of the right vertebral artery.  Major intracranial vascular structures are patent.  Degenerative changes cervical spine most notable C5-6 with slight cord flattening.  Paranasal sinus mucosal thickening / opacification most notable involving the ethmoid sinus air cells, left sphenoid sinus air cell and maxillary sinuses where there are air fluid levels which may indicate acute sinusitis.  IMPRESSION: Abnormal MRI scan may represent combination of result of prior herpes encephalitis and incidentally detected right temporal region meningioma. Active herpes encephalitis cannot be entirely excluded by MR imaging.   High resolution T2-weighted imaging through the right temporal lobe mass would prove helpful to determine if this is extra-axial in location.  Please see above discussion.  Paranasal sinus opacification with air fluid levels raising the possibility of acute sinusitis.  Critical Value/emergent results were called by telephone at the time of interpretation on 08/17/2012 at 5:40 p.m. to Dr. Silverio Lay, who verbally acknowledged these results.   Original Report Authenticated By: Lacy Duverney, M.D.     Assessment/Plan: 1)  Encephalopathy - infectious (HSV) vs non-infectious.  Awaiting work up including HSV PCR.  EEG being done now. No signs of bacterial infection,  will d/c vancomycin.  Had benzos, opiods, amphetamines on board at admission (are on medication list - temazepam, Adderal, codeine) though was reportedly not taking.    2) ? Pneumonia - not hypoxic, breathing normal, no fever or leukocytosis (leukopenic).  Not suggestive of pneumonia.  CXR may be pneumonitis.  Will d/c antibiotics.   3) ? UTI - WBCs and bacteria but asymptomatic.  Urine culture unrevealing.  Will hold of on any antibiotic therapy.    Staci Righter, MD [facility] [phone] pager   08/19/2012, 1:53 PM

## 2012-08-19 NOTE — Telephone Encounter (Signed)
Thanks

## 2012-08-20 DIAGNOSIS — N390 Urinary tract infection, site not specified: Secondary | ICD-10-CM

## 2012-08-20 LAB — CBC WITH DIFFERENTIAL/PLATELET
Basophils Absolute: 0 10*3/uL (ref 0.0–0.1)
Eosinophils Relative: 4 % (ref 0–5)
Lymphocytes Relative: 30 % (ref 12–46)
MCV: 80.5 fL (ref 78.0–100.0)
Monocytes Relative: 12 % (ref 3–12)
Neutrophils Relative %: 53 % (ref 43–77)
Platelets: 225 10*3/uL (ref 150–400)
RBC: 4.47 MIL/uL (ref 3.87–5.11)
RDW: 15.6 % — ABNORMAL HIGH (ref 11.5–15.5)
WBC: 3.2 10*3/uL — ABNORMAL LOW (ref 4.0–10.5)

## 2012-08-20 LAB — COMPREHENSIVE METABOLIC PANEL
ALT: 18 U/L (ref 0–35)
AST: 21 U/L (ref 0–37)
Albumin: 2.6 g/dL — ABNORMAL LOW (ref 3.5–5.2)
Alkaline Phosphatase: 66 U/L (ref 39–117)
Calcium: 9.1 mg/dL (ref 8.4–10.5)
GFR calc Af Amer: >90 mL/min (ref >90)
Glucose, Bld: 85 mg/dL (ref 70–99)
Potassium: 4.1 mEq/L (ref 3.5–5.1)
Sodium: 139 mEq/L (ref 135–145)
Total Protein: 5.4 g/dL — ABNORMAL LOW (ref 6.0–8.3)

## 2012-08-20 NOTE — Progress Notes (Addendum)
History: Emily Steele is a 57 y.o. female who has been increasingly confused over the past couple of weeks. The brother has seen her only intermittently over the past year do to her being in [place] taking care of their mother who was sick.   Apparently, in late February or early March she had an episode where she was complaining of headache and became very fatigued spending several days in her room. At that time, she was taken to the emergency room by family member, but checked out AMA prior to her diagnosis being given. It is unclear to me if she was febrile at that time.   She got back to [place] on April 13, and the following weekend she was maybe exhibiting some mild confusion. She got lost in an area where she should have known where she was going, and seemed to have some problems with her memory. More recently this has become very pronounced and she is having difficulty with organizing her medications. Apparently since March, she is only sporadically been able to take her medications as prescribed   Subjective:  Patient alert. Seems less confused.  Her son came yesterday and has been walking her dog which she was worried about yesterday. I gave her the news of no herpetic findings, she really did not react to this.   Objective: BP 127/89  Pulse 72  Temp(Src) 98 F (36.7 C) (Oral)  Resp 18  Ht 5\' 1"  (1.549 m)  Wt 87.998 kg (194 lb)  BMI 36.67 kg/m2  SpO2 96%  CBGs No results found for this basename: GLUCAP,  in the last 72 hours   Medications: Scheduled: . acyclovir  10 mg/kg (Ideal) Intravenous Q8H  . buPROPion  150 mg Oral BID  . DULoxetine  60 mg Oral BID  . gabapentin  100 mg Oral TID  . levothyroxine  50 mcg Oral QAC breakfast  . potassium chloride  40 mEq Oral Daily  . sodium chloride  3 mL Intravenous Q12H  . sodium chloride  3 mL Intravenous Q12H    Neurologic Exam: Alert and oriented to place but remains disoriented to time, including month and  year.Speech was normal with no dysarthria. Extraocular movements were full and with no nystagmus.  Visual fields were intact and normal.  No facial weakness noted.  Motor exam is normal with normal strength and tone throughout. No sensory loss in face or extremities bilaterally.  Lab Results: CBC:  Recent Labs Lab 08/17/12 1341 08/20/12 0525  WBC 3.4* 3.2*  NEUTROABS  --  1.7  HGB 12.2 11.8*  HCT 35.1* 36.0  MCV 78.7 80.5  PLT 208 225   Basic Metabolic Panel:  Recent Labs Lab 08/18/12 0911 08/20/12 0525  NA 136 139  K 3.9 4.1  CL 105 105  CO2 21 26  GLUCOSE 118* 85  BUN 3* 3*  CREATININE 0.51 0.76  CALCIUM 8.3* 9.1   Liver Function Tests:  Recent Labs Lab 08/17/12 1341 08/20/12 0525  AST 43* 21  ALT 27 18  ALKPHOS 76 66  BILITOT 0.4 0.3  PROT 6.1 5.4*  ALBUMIN 3.0* 2.6*   Hemoglobin A1C: No results found for this basename: HGBA1C,  in the last 168 hours Fasting Lipid Panel: No results found for this basename: CHOL, HDL, LDLCALC, TRIG, CHOLHDL, LDLDIRECT,  in the last 168 hours Thyroid Function Tests:  Recent Labs Lab 08/17/12 1442 08/18/12 0530  TSH 1.509 1.815  T3FREE 2.6  --    Coagulation: No results  found for this basename: LABPROT, INR,  in the last 168 hours   Study Results: Mr Brain Ltd W/o Cm  08/19/2012  *RADIOLOGY REPORT*  Clinical Data: Follow-up abnormal MRI.  Rule out mass lesion. Altered mental status.  MRI HEAD WITHOUT CONTRAST  Technique:  Multiplanar, multiecho pulse sequences of the brain and surrounding structures were obtained according to standard protocol without intravenous contrast.  Comparison: MRI brain with contrast 08/17/2012  Findings: Thin section T2 imaging was performed without intravenous contrast to evaluate an enhancing lesion in the right temporal lobe.  Unfortunately, the patient had difficulty holding still and there is motion degrading image quality.  Again noted is  cystic encephalomalacia involving the right  temporal cortex.  This involves the lateral and inferior cortex as well as the medial temporal cortex.  There is volume loss of the hippocampus with enlargement of the temporal horn.  There is also hyperintensity in the right temporal white matter.  These findings are compatible with  chronic insult  such as ischemia or chronic herpes infection.  There is a solid densely enhancing mass lesion in the right posterior temporal lobe laterally.  This is difficult to see on the current study.  Based on the thin sections I cannot say that this is extra-axial and I would favor it is intra-axial.  This area is slightly hyperintense to normal cortex.  Ventricle size is normal.  No shift to the midline structures.  Air-fluid levels in the paranasal sinuses again noted.  IMPRESSION: Thin section T2 imaging through the temporal lobe on the right is degraded by artifact.  Cystic encephalomalacia throughout the right temporal cortex compatible a chronic insult such as a chronic herpes encephalitis or chronic ischemia.  The former is favored.  Solid enhancing mass lesion in the right posterior temporal cortex is difficult to see without intravenous contrast.  It appears to be intra-axial.  Differential includes neoplasm such as metastatic disease, glioma, and meningioma.  Infection such as reactivation of herpes encephalitis is possible however the pattern is not typical with a densely solid enhancing pattern.  Correlate with CSF studies and serology.  Follow-up MRI with contrast may be helpful.   Original Report Authenticated By: Janeece Riggers, M.D.     Assessment/Plan: 57 yo female with progressive cognitive abnormalities over the past several months, initially thought to be herpetic, however, this study was NEGATIVE. Acyclovir has been stopped. EEG reveals no seizure activity. MRI above reviewed with Dr. Roseanne Reno.  I suspect that she will not be able to go home from this hospitalization and will need to be placed. I spent >  25 min with this patient, > 50% reviewing case, nursing staff, direct patient care.  No further neurologic intervention is recommended at this time.  If further questions arise, please call or page at that time.  Thank you for allowing neurology to participate in the care of this patient. >45 min, over 1/2 counseling patient, review of films, collaboration of care with medical team  08/20/2012  9:03 AM Guy Franco PA-C, MBA, MHA [facility] Pager [phone]   LOS: 3 days

## 2012-08-20 NOTE — Procedures (Signed)
EEG NUMBER:  [id number].  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  DESCRIPTION:  This is a routine EEG recording performed during wakefulness.  Predominant background activity consisted of low amplitude, diffuse, continuous 1-2 Hz delta activity with superimposed, 9-10 hertz alpha rhythm recorded from the posterior head regions as well as mixed rhythms of low-to-moderate amplitude recorded from the frontal and central regions.  Photic stimulation was not performed. Hyperventilation was not performed.  No epileptiform discharges were recorded.  There were no areas of disproportionate focal slowing.  INTERPRETATION:  The EEG showed mild nonspecific generalized continuous slowing of cerebral activity.  There was no disproportionate focal slowing involving the right temporal region.  No evidence of an epileptic disorder was demonstrated.     Noel Christmas, MD    YQ:MVHQ D:  08/19/2012 15:22:45  T:  08/20/2012 02:06:33  Job #:  [id number]

## 2012-08-20 NOTE — Progress Notes (Signed)
[facility] PROGRESS NOTE  Emily Steele ZOX:[id number] DOB: 1955-11-11 DOA: 08/17/2012 PCP: Dow Adolph, MD  Assessment/Plan: 1. Acute encephalopathy: possibly a combination of infectious and non infectious causes from drug use.  - LP done and CSF HSV PCR is negative. Further  analysis pending.  - vancomycin to be added as per discussion with Dr snider.  - MRI with thin slides done yesterday showed solid enhancing mass lesion int eh right post temporal cortex.  - EEG ordered and negative for seizures.  - ID Benjamine Mola board to assist [place].   2. Depression: resume home medications.   3. CAP; resume rocephin and zithromax.   4. Hypokalemia: will be repleted as needed.   5. Abnormal UA: Cultures show 25,000 with multiple bacterial morphotypes  6. Abnormal UDS: contributing to the encephalopathy.   7. Hypothyroidism; resume synthroid.   DVT prophylaxis.   Code Status: full code Family Communication:  None at bedside Disposition Plan: pending   Consultants:  Neurology  ID    Antibiotics:  ROCEPHIN   ACYCLOVIR.  Vancomycin.  HPI/Subjective: Appears comfortable, and is oriented to person only.  PT eval ordered.   Objective: Filed Vitals:   08/19/12 0600 08/19/12 1432 08/19/12 2200 08/20/12 0600  BP: 125/86 127/86 141/92 127/89  Pulse: 67 73 70 72  Temp: 98.6 F (37 C) 98 F (36.7 C) 98.6 F (37 C) 98 F (36.7 C)  TempSrc: Oral Oral Oral Oral  Resp: 18 16 18 18   Height:      Weight:      SpO2: 97% 97% 95% 96%    Intake/Output Summary (Last 24 hours) at 08/20/12 1130 Last data filed at 08/20/12 0953  Gross per 24 hour  Intake    480 ml  Output    100 ml  Net    380 ml   Filed Weights   08/17/12 2055 08/17/12 2300  Weight: 88.451 kg (195 lb) 87.998 kg (194 lb)    Exam: She is alert afebrile and oriented to person ONLY. CHEST: Normal respiration, clear to auscultation bilaterally.  HEART: Regular rate and rhythm. There are no murmur,  rub, or gallops.  BACK: No kyphosis or scoliosis; no CVA tenderness  ABDOMEN: soft and non-tender; no masses, no organomegaly, normal abdominal bowel sounds; no pannus; no intertriginous candida. There is no rebound and no distention.  EXTREMITIES: No bone or joint deformity; age-appropriate arthropathy of the hands and knees; no edema; no ulcerations. There is no calf tendernes NEURO: no motor deficits. No facial asymmetry.    Data Reviewed: Basic Metabolic Panel:  Recent Labs Lab 08/17/12 1341 08/18/12 0911 08/20/12 0525  NA 131* 136 139  K 2.8* 3.9 4.1  CL 95* 105 105  CO2 24 21 26   GLUCOSE 98 118* 85  BUN 13 3* 3*  CREATININE 0.69 0.51 0.76  CALCIUM 8.8 8.3* 9.1   Liver Function Tests:  Recent Labs Lab 08/17/12 1341 08/20/12 0525  AST 43* 21  ALT 27 18  ALKPHOS 76 66  BILITOT 0.4 0.3  PROT 6.1 5.4*  ALBUMIN 3.0* 2.6*   No results found for this basename: LIPASE, AMYLASE,  in the last 168 hours No results found for this basename: AMMONIA,  in the last 168 hours CBC:  Recent Labs Lab 08/17/12 1341 08/20/12 0525  WBC 3.4* 3.2*  NEUTROABS  --  1.7  HGB 12.2 11.8*  HCT 35.1* 36.0  MCV 78.7 80.5  PLT 208 225   Cardiac Enzymes: No results found for this basename:  CKTOTAL, CKMB, CKMBINDEX, TROPONINI,  in the last 168 hours BNP (last 3 results) No results found for this basename: PROBNP,  in the last 8760 hours CBG: No results found for this basename: GLUCAP,  in the last 168 hours  Recent Results (from the past 240 hour(s))  URINE CULTURE     Status: None   Collection Time    08/17/12  4:07 PM      Result Value Range Status   Specimen Description URINE, CLEAN CATCH   Final   Special Requests NONE   Final   Culture  Setup Time 08/17/2012 20:06   Final   Colony Count 25,000 COLONIES/ML   Final   Culture     Final   Value: Multiple bacterial morphotypes present, none predominant. Suggest appropriate recollection if clinically indicated.   Report Status  08/18/2012 FINAL   Final  GRAM STAIN     Status: None   Collection Time    08/17/12  7:05 PM      Result Value Range Status   Specimen Description CSF   Final   Special Requests NONE   Final   Gram Stain     Final   Value: NO ORGANISMS SEEN     WBC PRESENT, PREDOMINANTLY MONONUCLEAR     CYTOSPIN SMEAR     Gram Stain Report Called to,Read Back By and Verified With: Lizbeth Bark [id number] @ 2025 BY J SCOTTON   Report Status 08/17/2012 FINAL   Final  CSF CULTURE     Status: None   Collection Time    08/17/12  7:06 PM      Result Value Range Status   Specimen Description CSF   Final   Special Requests NONE   Final   Gram Stain     Final   Value: CYTOSPIN SMEAR WBC PRESENT, PREDOMINANTLY MONONUCLEAR     NO ORGANISMS SEEN     Gram Stain Report Called to,Read Back By and Verified With: Gram Stain Report Called to,Read Back By and Verified With: Margarita Rana PA [id number] @ 2025 BY J SCOTTON Performed by [facility]   Culture NO GROWTH 1 DAY   Final   Report Status PENDING   Incomplete     Studies: Mr Brain Ltd W/o Cm  08/19/2012  *RADIOLOGY REPORT*  Clinical Data: Follow-up abnormal MRI.  Rule out mass lesion. Altered mental status.  MRI HEAD WITHOUT CONTRAST  Technique:  Multiplanar, multiecho pulse sequences of the brain and surrounding structures were obtained according to standard protocol without intravenous contrast.  Comparison: MRI brain with contrast 08/17/2012  Findings: Thin section T2 imaging was performed without intravenous contrast to evaluate an enhancing lesion in the right temporal lobe.  Unfortunately, the patient had difficulty holding still and there is motion degrading image quality.  Again noted is  cystic encephalomalacia involving the right temporal cortex.  This involves the lateral and inferior cortex as well as the medial temporal cortex.  There is volume loss of the hippocampus with enlargement of the temporal horn.  There is also hyperintensity in the right  temporal white matter.  These findings are compatible with  chronic insult  such as ischemia or chronic herpes infection.  There is a solid densely enhancing mass lesion in the right posterior temporal lobe laterally.  This is difficult to see on the current study.  Based on the thin sections I cannot say that this is extra-axial and I would favor it is intra-axial.  This area is slightly  hyperintense to normal cortex.  Ventricle size is normal.  No shift to the midline structures.  Air-fluid levels in the paranasal sinuses again noted.  IMPRESSION: Thin section T2 imaging through the temporal lobe on the right is degraded by artifact.  Cystic encephalomalacia throughout the right temporal cortex compatible a chronic insult such as a chronic herpes encephalitis or chronic ischemia.  The former is favored.  Solid enhancing mass lesion in the right posterior temporal cortex is difficult to see without intravenous contrast.  It appears to be intra-axial.  Differential includes neoplasm such as metastatic disease, glioma, and meningioma.  Infection such as reactivation of herpes encephalitis is possible however the pattern is not typical with a densely solid enhancing pattern.  Correlate with CSF studies and serology.  Follow-up MRI with contrast may be helpful.   Original Report Authenticated By: Janeece Riggers, M.D.     Scheduled Meds: . acyclovir  10 mg/kg (Ideal) Intravenous Q8H  . buPROPion  150 mg Oral BID  . DULoxetine  60 mg Oral BID  . gabapentin  100 mg Oral TID  . levothyroxine  50 mcg Oral QAC breakfast  . potassium chloride  40 mEq Oral Daily  . sodium chloride  3 mL Intravenous Q12H  . sodium chloride  3 mL Intravenous Q12H   Continuous Infusions:   Principal Problem:   Altered mental status Active Problems:   Major Depressive Disorder   Fibromyalgia   Hypothyroidism   Obesity   Herpetic encephalitis   CAP (community acquired pneumonia)    Time spent: 35  minutes    Andrian Urbach  [facility] Pager [phone]  If 7PM-7AM, please contact night-coverage at [email or web address], password [facility] 08/20/2012, 11:30 AM  LOS: 3 days

## 2012-08-20 NOTE — Progress Notes (Addendum)
[facility]  Date of Admission:  08/17/2012  Antibiotics: Acyclovir day 3  Subjective: No new issues  Objective: Temp:  [98 F (36.7 C)-98.6 F (37 C)] 98.2 F (36.8 C) (05/06 1334) Pulse Rate:  [70-78] 78 (05/06 1334) Resp:  [16-18] 16 (05/06 1334) BP: (127-141)/(87-92) 136/87 mmHg (05/06 1334) SpO2:  [95 %-97 %] 97 % (05/06 1334)  General: Awake, alert   Lab Results Lab Results  Component Value Date   WBC 3.2* 08/20/2012   HGB 11.8* 08/20/2012   HCT 36.0 08/20/2012   MCV 80.5 08/20/2012   PLT 225 08/20/2012    Lab Results  Component Value Date   CREATININE 0.76 08/20/2012   BUN 3* 08/20/2012   NA 139 08/20/2012   K 4.1 08/20/2012   CL 105 08/20/2012   CO2 26 08/20/2012    Lab Results  Component Value Date   ALT 18 08/20/2012   AST 21 08/20/2012   ALKPHOS 66 08/20/2012   BILITOT 0.3 08/20/2012      Microbiology: Recent Results (from the past 240 hour(s))  URINE CULTURE     Status: None   Collection Time    08/17/12  4:07 PM      Result Value Range Status   Specimen Description URINE, CLEAN CATCH   Final   Special Requests NONE   Final   Culture  Setup Time 08/17/2012 20:06   Final   Colony Count 25,000 COLONIES/ML   Final   Culture     Final   Value: Multiple bacterial morphotypes present, none predominant. Suggest appropriate recollection if clinically indicated.   Report Status 08/18/2012 FINAL   Final  GRAM STAIN     Status: None   Collection Time    08/17/12  7:05 PM      Result Value Range Status   Specimen Description CSF   Final   Special Requests NONE   Final   Gram Stain     Final   Value: NO ORGANISMS SEEN     WBC PRESENT, PREDOMINANTLY MONONUCLEAR     CYTOSPIN SMEAR     Gram Stain Report Called to,Read Back By and Verified With: Lizbeth Bark [id number] @ 2025 BY J SCOTTON   Report Status 08/17/2012 FINAL   Final  CSF CULTURE     Status: None   Collection Time    08/17/12  7:06 PM      Result Value Range Status   Specimen  Description CSF   Final   Special Requests NONE   Final   Gram Stain     Final   Value: CYTOSPIN SMEAR WBC PRESENT, PREDOMINANTLY MONONUCLEAR     NO ORGANISMS SEEN     Gram Stain Report Called to,Read Back By and Verified With: Gram Stain Report Called to,Read Back By and Verified With: Margarita Rana PA [id number] @ 2025 BY J SCOTTON Performed by [facility]   Culture NO GROWTH 2 DAYS   Final   Report Status PENDING   Incomplete    Studies/Results: Mr Brain Ltd W/o Cm  08/19/2012  *RADIOLOGY REPORT*  Clinical Data: Follow-up abnormal MRI.  Rule out mass lesion. Altered mental status.  MRI HEAD WITHOUT CONTRAST  Technique:  Multiplanar, multiecho pulse sequences of the brain and surrounding structures were obtained according to standard protocol without intravenous contrast.  Comparison: MRI brain with contrast 08/17/2012  Findings: Thin section T2 imaging was performed without intravenous contrast to evaluate an enhancing lesion in the right  temporal lobe.  Unfortunately, the patient had difficulty holding still and there is motion degrading image quality.  Again noted is  cystic encephalomalacia involving the right temporal cortex.  This involves the lateral and inferior cortex as well as the medial temporal cortex.  There is volume loss of the hippocampus with enlargement of the temporal horn.  There is also hyperintensity in the right temporal white matter.  These findings are compatible with  chronic insult  such as ischemia or chronic herpes infection.  There is a solid densely enhancing mass lesion in the right posterior temporal lobe laterally.  This is difficult to see on the current study.  Based on the thin sections I cannot say that this is extra-axial and I would favor it is intra-axial.  This area is slightly hyperintense to normal cortex.  Ventricle size is normal.  No shift to the midline structures.  Air-fluid levels in the paranasal sinuses again noted.  IMPRESSION: Thin section T2  imaging through the temporal lobe on the right is degraded by artifact.  Cystic encephalomalacia throughout the right temporal cortex compatible a chronic insult such as a chronic herpes encephalitis or chronic ischemia.  The former is favored.  Solid enhancing mass lesion in the right posterior temporal cortex is difficult to see without intravenous contrast.  It appears to be intra-axial.  Differential includes neoplasm such as metastatic disease, glioma, and meningioma.  Infection such as reactivation of herpes encephalitis is possible however the pattern is not typical with a densely solid enhancing pattern.  Correlate with CSF studies and serology.  Follow-up MRI with contrast may be helpful.   Original Report Authenticated By: Janeece Riggers, M.D.     Assessment/Plan: 1)  Encephalopathy - does not appear to be infectious.  HSV negative and now off of acyclovir.  Further work up per neurology.   2) leukopenia - this has been persistent with no known etiology.  She may need further work up as an outpatient with hematology.  Could it be from neurontin?     I will sign off, please call with questions.    Staci Righter, MD [facility] [phone] pager   08/20/2012, 3:52 PM

## 2012-08-21 ENCOUNTER — Inpatient Hospital Stay (HOSPITAL_COMMUNITY): Payer: BC Managed Care – PPO

## 2012-08-21 LAB — CSF CULTURE W GRAM STAIN: Culture: NO GROWTH

## 2012-08-21 MED ORDER — LORAZEPAM 2 MG/ML IJ SOLN
2.0000 mg | Freq: Once | INTRAMUSCULAR | Status: AC
  Administered 2012-08-21: 2 mg via INTRAVENOUS
  Filled 2012-08-21: qty 1

## 2012-08-21 MED ORDER — ZOLPIDEM TARTRATE 5 MG PO TABS
5.0000 mg | ORAL_TABLET | Freq: Once | ORAL | Status: AC
  Administered 2012-08-21: 5 mg via ORAL
  Filled 2012-08-21: qty 1

## 2012-08-21 MED ORDER — GADOBENATE DIMEGLUMINE 529 MG/ML IV SOLN
18.0000 mL | Freq: Once | INTRAVENOUS | Status: AC | PRN
  Administered 2012-08-21: 18 mL via INTRAVENOUS

## 2012-08-21 NOTE — Progress Notes (Signed)
[facility]             Progress Note   Subjective: Completely alert and oriented. Complains of mild HA and cough.  Objective: Vital signs in last 24 hours: Temp:  [97.7 F (36.5 C)-98.2 F (36.8 C)] 97.9 F (36.6 C) (05/07 1447) Pulse Rate:  [67-76] 76 (05/07 1447) Resp:  [18] 18 (05/07 1447) BP: (134-144)/(88-96) 144/96 mmHg (05/07 1447) SpO2:  [92 %-95 %] 92 % (05/07 1447) Weight change:  Last BM Date: 08/20/12  Intake/Output from previous day: 05/06 0701 - 05/07 0700 In: 600 [P.O.:600] Out: -  Total I/O In: 120 [P.O.:120] Out: 1000 [Urine:1000]   Physical Exam: General: Alert, awake, oriented x3. HEENT: No bruits, no goiter. Heart: Regular rate and rhythm, without murmurs, rubs, gallops. Lungs: Clear to auscultation bilaterally. Abdomen: Soft, nontender, nondistended, positive bowel sounds. Extremities: No clubbing cyanosis or edema with positive pedal pulses. Neuro: Grossly intact, nonfocal.    Lab Results: Basic Metabolic Panel:  Recent Labs  40/98/11 0525  NA 139  K 4.1  CL 105  CO2 26  GLUCOSE 85  BUN 3*  CREATININE 0.76  CALCIUM 9.1   Liver Function Tests:  Recent Labs  08/20/12 0525  AST 21  ALT 18  ALKPHOS 66  BILITOT 0.3  PROT 5.4*  ALBUMIN 2.6*   CBC:  Recent Labs  08/20/12 0525  WBC 3.2*  NEUTROABS 1.7  HGB 11.8*  HCT 36.0  MCV 80.5  PLT 225   Urine Drug Screen: Drugs of Abuse     Component Value Date/Time   LABOPIA POSITIVE* 08/17/2012 1607   COCAINSCRNUR NONE DETECTED 08/17/2012 1607   LABBENZ POSITIVE* 08/17/2012 1607   AMPHETMU POSITIVE* 08/17/2012 1607   THCU NONE DETECTED 08/17/2012 1607   LABBARB NONE DETECTED 08/17/2012 1607     Recent Results (from the past 240 hour(s))  URINE CULTURE     Status: None   Collection Time    08/17/12  4:07 PM      Result Value Range Status   Specimen Description URINE, CLEAN CATCH   Final   Special Requests NONE   Final   Culture  Setup Time 08/17/2012 20:06    Final   Colony Count 25,000 COLONIES/ML   Final   Culture     Final   Value: Multiple bacterial morphotypes present, none predominant. Suggest appropriate recollection if clinically indicated.   Report Status 08/18/2012 FINAL   Final  GRAM STAIN     Status: None   Collection Time    08/17/12  7:05 PM      Result Value Range Status   Specimen Description CSF   Final   Special Requests NONE   Final   Gram Stain     Final   Value: NO ORGANISMS SEEN     WBC PRESENT, PREDOMINANTLY MONONUCLEAR     CYTOSPIN SMEAR     Gram Stain Report Called to,Read Back By and Verified With: Lizbeth Bark [id number] @ 2025 BY J SCOTTON   Report Status 08/17/2012 FINAL   Final  CSF CULTURE     Status: None   Collection Time    08/17/12  7:06 PM      Result Value Range Status   Specimen Description CSF   Final   Special Requests NONE   Final   Gram Stain     Final   Value: CYTOSPIN SMEAR WBC PRESENT, PREDOMINANTLY MONONUCLEAR     NO ORGANISMS SEEN     Gram Stain  Report Called to,Read Back By and Verified With: Gram Stain Report Called to,Read Back By and Verified With: Margarita Rana PA [phone] @ 2025 BY J SCOTTON Performed by [facility]   Culture NO GROWTH 3 DAYS   Final   Report Status 08/21/2012 FINAL   Final    Studies/Results: Mr Brain Ltd W/o Cm  08/19/2012  *RADIOLOGY REPORT*  Clinical Data: Follow-up abnormal MRI.  Rule out mass lesion. Altered mental status.  MRI HEAD WITHOUT CONTRAST  Technique:  Multiplanar, multiecho pulse sequences of the brain and surrounding structures were obtained according to standard protocol without intravenous contrast.  Comparison: MRI brain with contrast 08/17/2012  Findings: Thin section T2 imaging was performed without intravenous contrast to evaluate an enhancing lesion in the right temporal lobe.  Unfortunately, the patient had difficulty holding still and there is motion degrading image quality.  Again noted is  cystic encephalomalacia involving the right  temporal cortex.  This involves the lateral and inferior cortex as well as the medial temporal cortex.  There is volume loss of the hippocampus with enlargement of the temporal horn.  There is also hyperintensity in the right temporal white matter.  These findings are compatible with  chronic insult  such as ischemia or chronic herpes infection.  There is a solid densely enhancing mass lesion in the right posterior temporal lobe laterally.  This is difficult to see on the current study.  Based on the thin sections I cannot say that this is extra-axial and I would favor it is intra-axial.  This area is slightly hyperintense to normal cortex.  Ventricle size is normal.  No shift to the midline structures.  Air-fluid levels in the paranasal sinuses again noted.  IMPRESSION: Thin section T2 imaging through the temporal lobe on the right is degraded by artifact.  Cystic encephalomalacia throughout the right temporal cortex compatible a chronic insult such as a chronic herpes encephalitis or chronic ischemia.  The former is favored.  Solid enhancing mass lesion in the right posterior temporal cortex is difficult to see without intravenous contrast.  It appears to be intra-axial.  Differential includes neoplasm such as metastatic disease, glioma, and meningioma.  Infection such as reactivation of herpes encephalitis is possible however the pattern is not typical with a densely solid enhancing pattern.  Correlate with CSF studies and serology.  Follow-up MRI with contrast may be helpful.   Original Report Authenticated By: Janeece Riggers, M.D.     Medications: Scheduled Meds: . buPROPion  150 mg Oral BID  . DULoxetine  60 mg Oral BID  . gabapentin  100 mg Oral TID  . levothyroxine  50 mcg Oral QAC breakfast  . LORazepam  2 mg Intravenous Once  . potassium chloride  40 mEq Oral Daily  . sodium chloride  3 mL Intravenous Q12H  . sodium chloride  3 mL Intravenous Q12H   Continuous Infusions:  PRN Meds:.sodium  chloride, guaiFENesin-dextromethorphan, sodium chloride, temazepam  Assessment/Plan:  Principal Problem:   Altered mental status Active Problems:   Major Depressive Disorder   Fibromyalgia   Hypothyroidism   Obesity   Herpetic encephalitis   CAP (community acquired pneumonia)    Acute encephalopathy -Presumed related to pneumonia. -Herpes encephalitis has been ruled out with a negative HSV PCR. -Resolved.  Community-acquired pneumonia -Levaquin for 5 days. -All culture data is negative to date.  Questionable brain mass -Patient has had 2 MRIs this admission that have been poor quality given motion artifact. She does have a  brain mass, the question is whether this is a meningioma versus an intra-axial lesion. Have discussed with Dr. Thad Ranger with neurology who in turn has discussed with radiology. Best course of action would be to repeat MRI. I believe patient will be able to comply today as her mental status is significantly improved, we'll reorder MRI of the brain.  Disposition -Potential discharge in the morning pending results of MRI versus neurosurgical exploration.   Time spent coordinating care: 40 minutes   LOS: 4 days   HERNANDEZ ACOSTA,ESTELA [facility] Pager: [phone] 08/21/2012, 3:15 PM

## 2012-08-22 DIAGNOSIS — D320 Benign neoplasm of cerebral meninges: Secondary | ICD-10-CM

## 2012-08-22 NOTE — Discharge Summary (Signed)
Physician Discharge Summary  Emily Steele VOZ:[id number] DOB: 1956-04-17 DOA: 08/17/2012  PCP: Dow Adolph, MD  Admit date: 08/17/2012 Discharge date: 08/22/2012  Time spent: Greater than 30 minutes minutes  Recommendations for Outpatient Follow-up:  Has been advised to followup with her primary care provider in 2 weeks. Will need followup MRI in 6 months for her meningioma. If needed, Dr. Venetia Maxon (neurosurgery) has been the physician involved in her case. Home health therapies will be set up at patient's request.   Discharge Diagnoses:  Principal Problem:   Altered mental status Active Problems:   Major Depressive Disorder   Fibromyalgia   Hypothyroidism   Obesity   Herpetic encephalitis   CAP (community acquired pneumonia)   Discharge Condition: Stable and improved  Filed Weights   08/17/12 2055 08/17/12 2300  Weight: 88.451 kg (195 lb) 87.998 kg (194 lb)    History of present illness:  Patient is a 57 y.o. female with complex history but at least hypothyroidism, HTN, GERD, depression and fibromyalgia, chronic back pain and bilateral leg pain, brought in by family as she was having decreased alertness and having intermittent confusion over the past month. She originally from [place], and has moved here about 11 years ago. She lives alone. Family noted she has been more confused and intermittently inappropriate. She had a cough and was placed on codeine cough syrup, but family didn't know if it was relevant. She was getting more confused and didn't take her medication. Recently, she was pulled over by the police for not driving properly. Evaluation in the ER included an LP which showed pleocytosis of 13 WBC, with lymphopredomiant. Her WBC is 3.2 K, and her K was 2.8 mEq/L with normal renal Fx tests. Her MRI showed abnormality in the temperal lobe region raises possibility of HSV E, either old or new, along with a possible meningioma. She was started on IV Acyclovir. Her CXR also showed  infiltrate consistent with PNA, and her UA was indicative of UTI as well with TNTC WBCs. Hospitalist was asked to admit her for AMS, possible HSV E, CAP, and UTI.   Hospital Course:   Acute encephalopathy -Resolved. She is now alert awake and oriented x3. -Etiology remains somewhat uncertain, but I suspect most likely related to her active pneumonia and UTI at time of admission.  -There was a concern for encephalitis on admission, hence an LP was performed that showed 13 wbc's with a lymphocytic predominance. She was placed on acyclovir which was discontinued after her HSV PCR returned negative. -EEG without evidence of epileptic disorder. -Infectious diseases, Dr.Comer, has been involved in her case. Her most recent MRI shows signs of possible active encephalitis, however infectious disease has recommended no further treatment as she has clinically improved with treatment of her pneumonia and the findings of her LP could be from the meningioma. Serology for other types of encephalitis have also been sent out, however these would be clinically self limiting and she is clinically improving.  Meningioma -This was found incidentally on her MRI. -She does not appear to be symptomatic from this i.e. no seizures, diplopia or blurred vision. -Have discussed with both Dr. Thad Ranger with neurology and Dr. Venetia Maxon with neurosurgery. Dr. Venetia Maxon would only recommend resection if she started developing symptoms from this. Since we have a plausible explanation for her acute confusion on admission, I do not believe it is related to the meningioma. He has recommended a followup MRI in 6 months or sooner if she has any further symptoms. He  has offered to see her in the office if this occurs.  Community-acquired pneumonia/urinary tract infection -She has completed a course of antibiotics and will not be discharged on any further treatment. -All culture data has been negative to date.   Consultations:  Infectious  disease, Dr. Luciana Axe  Neurology, Dr. Lu Duffel  Discharge Instructions  Discharge Orders   Future Orders Complete By Expires     Discontinue IV  As directed     Increase activity slowly  As directed         Medication List    STOP taking these medications       ampicillin 500 MG capsule  Commonly known as:  PRINCIPEN     azithromycin 500 MG tablet  Commonly known as:  ZITHROMAX     lidocaine 2 % solution  Commonly known as:  XYLOCAINE     metaxalone 800 MG tablet  Commonly known as:  SKELAXIN      TAKE these medications       amphetamine-dextroamphetamine 30 MG 24 hr capsule  Commonly known as:  ADDERALL XR  Take 30 mg by mouth 3 (three) times daily.     buPROPion 150 MG 12 hr tablet  Commonly known as:  WELLBUTRIN SR  Take 150 mg by mouth 2 (two) times daily.     DULoxetine 60 MG capsule  Commonly known as:  CYMBALTA  Take 60 mg by mouth 2 (two) times daily.     gabapentin 100 MG capsule  Commonly known as:  NEURONTIN  Take 100 mg by mouth 3 (three) times daily.     levothyroxine 50 MCG tablet  Commonly known as:  SYNTHROID, LEVOTHROID  Take 1 tablet (50 mcg total) by mouth daily.     lidocaine 5 %  Commonly known as:  LIDODERM  Place 1 patch onto the skin as needed. Remove & Discard patch within 12 hours or as directed by MD     ranitidine 150 MG capsule  Commonly known as:  ZANTAC  Take 1 capsule (150 mg total) by mouth 2 (two) times daily.     temazepam 15 MG capsule  Commonly known as:  RESTORIL  Take 15 mg by mouth daily.       Allergies  Allergen Reactions  . Doxycycline Other (See Comments)    headaches  . Sulfa Antibiotics     Headaches  . Methocarbamol Nausea Only    headaches       Follow-up Information   Follow up with [facility], [place]. Schedule an appointment as soon as possible for a visit in 2 weeks.   Contact information:   [address] [place] [address] [phone]        The results of significant  diagnostics from this hospitalization (including imaging, microbiology, ancillary and laboratory) are listed below for reference.    Significant Diagnostic Studies: Dg Chest 2 View  08/17/2012  *RADIOLOGY REPORT*  Clinical Data: Confusion.  Cough.  CHEST - 2 VIEW  Comparison: None.  Findings: There are low lung volumes.  The heart size and mediastinal contours are normal.  There is patchy airspace disease medially at the right lung base, best seen on the frontal examination.  The left lung is clear.  There is no pleural effusion or pneumothorax.  Telemetry leads overlie the chest.  Degenerative changes are present throughout the thoracic spine.  IMPRESSION: Patchy right basilar air space, suspicious for possible aspiration in this clinical context.   Original Report Authenticated By: Carey Bullocks,  M.D.    Dg Chest 2 View  08/12/2012  *RADIOLOGY REPORT*  Clinical Data: Congestion.  CHEST - 2 VIEW  Comparison: None.  Findings: Ill-defined densities present in the right midlung.  This could represent a small area of pneumonia, pulmonary parenchymal scarring or be due to summation shadows/artifact. Cardiopericardial silhouette mediastinal contours are within normal limits.  No pleural effusion.  IMPRESSION: Faint patchy density in the right midlung which may represent infection or scarring.  This was not noted on the preliminary report.  Follow-up to ensure radiographic clearing recommended. Clearing is usually observed at 8 weeks.  These results will be called to the ordering clinician or representative by the Radiologist Assistant, and communication documented in the PACS Dashboard.   Original Report Authenticated By: Andreas Newport, M.D.    Ct Head Wo Contrast  08/17/2012  *RADIOLOGY REPORT*  Clinical Data: Altered mental status with speech changes.  CT HEAD WITHOUT CONTRAST  Technique:  Contiguous axial images were obtained from the base of the skull through the vertex without contrast.  Comparison: None.   Findings: There is a low density throughout the right temporal lobe with volume loss and prominence of the overlying sulci.  The left temporal lobe appears normal.  There is no evidence of acute intracranial hemorrhage, focal extra-axial fluid collection or hydrocephalus. Some relatively high density is noted superior to the right petrous apex along the posterior aspect of this temporal lobe low density.  This likely represents volume averaging with adjacent normal brain tissue, although could indicate a meningioma.  Mucosal thickening is present throughout the ethmoid sinuses. There are air-fluid levels in the maxillary and sphenoid sinuses. The mastoids and middle ears are clear.  The calvarium is intact.  IMPRESSION:  1.  Abnormal process in the right temporal lobe has a chronic component manifesting as volume loss and may be the sequela of prior infarction or trauma.  However, an acute superimposed process such as herpes encephalitis cannot be completely excluded in this clinical context. 2.  Possible meningioma over the right petrous apex. 3.  No evidence of acute intracranial hemorrhage or positive mass effect. 4.  Sinusitis.  MRI of the brain without and with contrast is recommended for further evaluation. These results were called by telephone on 08/17/2012 at 1545 hours to Dr. Rosalia Hammers, who verbally acknowledged these results.   Original Report Authenticated By: Carey Bullocks, M.D.    Mr Laqueta Jean ZO Contrast  08/21/2012  *RADIOLOGY REPORT*  Clinical Data: Brain mass.  Abnormal MRI.  Altered mental status.  MRI HEAD WITHOUT AND WITH CONTRAST  Technique:  Multiplanar, multiecho pulse sequences of the brain and surrounding structures were obtained according to standard protocol without and with intravenous contrast  Contrast: 18mL MULTIHANCE GADOBENATE DIMEGLUMINE 529 MG/ML IV SOLN  Comparison: MRI brain without contrast 08/19/2012 and MRI brain without and with contrast 08/17/2012.  Findings: A solid enhancing  lateral mass lesion in the right temporal lobe is again noted, measuring 1.5 x 1.4 cm.  This lesion appears to be extra-axial and is most compatible with a meningioma. Encephalomalacia of the right temporal lobe and diffuse white matter signal abnormality is again noted, without significant change there is clearly enhancement at the periphery of the residual parenchyma.  The hippocampal structures thinned.  There is no evidence for hemorrhage.  Abnormal signal is present within the insular cortex bilaterally without evidence for acute infarct.  Flow is present in the major intracranial arteries.  Diffuse sinus disease is again noted.  IMPRESSION:  1.  Diffuse encephalomalacia of the right temporal lobe.  The HSV PCR is negative.  This may represent a remote injury due to herpes simplex.  A more typical viral encephalitis is still considered. Abnormal white cells with a lymphocytic predominance are noted in the CSF. 2.  Diffuse white matter signal in the right temporal lobe may be chronic. 3.  Abnormal signal along the insular cortex bilaterally is compatible with encephalitis.  Active disease remains a concern. 4.  No evidence for acute infarct or hemorrhage. 5.  The solid enhancing lesion is more clearly extra-axial and compatible with a meningioma. 6.  Diffuse sinus disease.   Original Report Authenticated By: Marin Roberts, M.D.    Mr Laqueta Jean Wo Contrast  08/17/2012  *RADIOLOGY REPORT*  Clinical Data: Altered mental status.  MRI HEAD WITHOUT AND WITH CONTRAST  Technique:  Multiplanar, multiecho pulse sequences of the brain and surrounding structures were obtained according to standard protocol without and with intravenous contrast  Contrast: 18mL MULTIHANCE GADOBENATE DIMEGLUMINE 529 MG/ML IV SOLN  Comparison: 08/17/2012 CT.  Findings: As best appreciated on FLAIR sequence (series 6) is abnormal signal of the right temporal lobe (including involvement of the right hippocampus and uncus).  Abnormal signal  also noted in the sub insular region bilaterally as well as the medial subfrontal region.  This distribution of changes raises possibility of herpes encephalitis.  As there is atrophy of the anterior right temporal lobe, it is possible the patient had herpes in the past and what is noted on the current exam represents sequelae of prior infection however, active infection cannot be excluded by MR imaging. No restricted motion as can be seen with acute herpes.  Confounding factor is that there is a 1.3 x 1.5 x 1.6 cm enhancing mass lateral aspect of the mid temporal region. It is difficult to state with certainty that this is extra-axial in location as may be expected with a meningioma.  High resolution thin section T2- weighted imaging would prove helpful to determine if this is extra- axial and exclude possibility of this representing an intra-axial lesion.  No acute infarct.  No intracranial hemorrhage.  No hydrocephalus.  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  Major intracranial vascular structures are patent.  Degenerative changes cervical spine most notable C5-6 with slight cord flattening.  Paranasal sinus mucosal thickening / opacification most notable involving the ethmoid sinus air cells, left sphenoid sinus air cell and maxillary sinuses where there are air fluid levels which may indicate acute sinusitis.  IMPRESSION: Abnormal MRI scan may represent combination of result of prior herpes encephalitis and incidentally detected right temporal region meningioma. Active herpes encephalitis cannot be entirely excluded by MR imaging.   High resolution T2-weighted imaging through the right temporal lobe mass would prove helpful to determine if this is extra-axial in location.  Please see above discussion.  Paranasal sinus opacification with air fluid levels raising the possibility of acute sinusitis.  Critical Value/emergent results were called by telephone at the time of interpretation on  08/17/2012 at 5:40 p.m. to Dr. Silverio Lay, who verbally acknowledged these results.   Original Report Authenticated By: Lacy Duverney, M.D.    Mr Brain Ltd W/o Cm  08/19/2012  *RADIOLOGY REPORT*  Clinical Data: Follow-up abnormal MRI.  Rule out mass lesion. Altered mental status.  MRI HEAD WITHOUT CONTRAST  Technique:  Multiplanar, multiecho pulse sequences of the brain and surrounding structures were obtained according to standard protocol without intravenous contrast.  Comparison: MRI brain  with contrast 08/17/2012  Findings: Thin section T2 imaging was performed without intravenous contrast to evaluate an enhancing lesion in the right temporal lobe.  Unfortunately, the patient had difficulty holding still and there is motion degrading image quality.  Again noted is  cystic encephalomalacia involving the right temporal cortex.  This involves the lateral and inferior cortex as well as the medial temporal cortex.  There is volume loss of the hippocampus with enlargement of the temporal horn.  There is also hyperintensity in the right temporal white matter.  These findings are compatible with  chronic insult  such as ischemia or chronic herpes infection.  There is a solid densely enhancing mass lesion in the right posterior temporal lobe laterally.  This is difficult to see on the current study.  Based on the thin sections I cannot say that this is extra-axial and I would favor it is intra-axial.  This area is slightly hyperintense to normal cortex.  Ventricle size is normal.  No shift to the midline structures.  Air-fluid levels in the paranasal sinuses again noted.  IMPRESSION: Thin section T2 imaging through the temporal lobe on the right is degraded by artifact.  Cystic encephalomalacia throughout the right temporal cortex compatible a chronic insult such as a chronic herpes encephalitis or chronic ischemia.  The former is favored.  Solid enhancing mass lesion in the right posterior temporal cortex is difficult to see  without intravenous contrast.  It appears to be intra-axial.  Differential includes neoplasm such as metastatic disease, glioma, and meningioma.  Infection such as reactivation of herpes encephalitis is possible however the pattern is not typical with a densely solid enhancing pattern.  Correlate with CSF studies and serology.  Follow-up MRI with contrast may be helpful.   Original Report Authenticated By: Janeece Riggers, M.D.     Microbiology: Recent Results (from the past 240 hour(s))  URINE CULTURE     Status: None   Collection Time    08/17/12  4:07 PM      Result Value Range Status   Specimen Description URINE, CLEAN CATCH   Final   Special Requests NONE   Final   Culture  Setup Time 08/17/2012 20:06   Final   Colony Count 25,000 COLONIES/ML   Final   Culture     Final   Value: Multiple bacterial morphotypes present, none predominant. Suggest appropriate recollection if clinically indicated.   Report Status 08/18/2012 FINAL   Final  GRAM STAIN     Status: None   Collection Time    08/17/12  7:05 PM      Result Value Range Status   Specimen Description CSF   Final   Special Requests NONE   Final   Gram Stain     Final   Value: NO ORGANISMS SEEN     WBC PRESENT, PREDOMINANTLY MONONUCLEAR     CYTOSPIN SMEAR     Gram Stain Report Called to,Read Back By and Verified With: Lizbeth Bark [phone] @ 2025 BY J SCOTTON   Report Status 08/17/2012 FINAL   Final  CSF CULTURE     Status: None   Collection Time    08/17/12  7:06 PM      Result Value Range Status   Specimen Description CSF   Final   Special Requests NONE   Final   Gram Stain     Final   Value: CYTOSPIN SMEAR WBC PRESENT, PREDOMINANTLY MONONUCLEAR     NO ORGANISMS SEEN     Gram  Stain Report Called to,Read Back By and Verified With: Gram Stain Report Called to,Read Back By and Verified With: Margarita Rana PA [phone] @ 2025 BY J SCOTTON Performed by [facility]   Culture NO GROWTH 3 DAYS   Final   Report Status  08/21/2012 FINAL   Final     Labs: Basic Metabolic Panel:  Recent Labs Lab 08/17/12 1341 08/18/12 0911 08/20/12 0525  NA 131* 136 139  K 2.8* 3.9 4.1  CL 95* 105 105  CO2 24 21 26   GLUCOSE 98 118* 85  BUN 13 3* 3*  CREATININE 0.69 0.51 0.76  CALCIUM 8.8 8.3* 9.1   Liver Function Tests:  Recent Labs Lab 08/17/12 1341 08/20/12 0525  AST 43* 21  ALT 27 18  ALKPHOS 76 66  BILITOT 0.4 0.3  PROT 6.1 5.4*  ALBUMIN 3.0* 2.6*   No results found for this basename: LIPASE, AMYLASE,  in the last 168 hours No results found for this basename: AMMONIA,  in the last 168 hours CBC:  Recent Labs Lab 08/17/12 1341 08/20/12 0525  WBC 3.4* 3.2*  NEUTROABS  --  1.7  HGB 12.2 11.8*  HCT 35.1* 36.0  MCV 78.7 80.5  PLT 208 225   Cardiac Enzymes: No results found for this basename: CKTOTAL, CKMB, CKMBINDEX, TROPONINI,  in the last 168 hours BNP: BNP (last 3 results) No results found for this basename: PROBNP,  in the last 8760 hours CBG: No results found for this basename: GLUCAP,  in the last 168 hours     Signed:  Chaya Jan  [facility] Pager: [phone] 08/22/2012, 3:43 PM

## 2012-08-22 NOTE — Progress Notes (Signed)
Pt's brother that assists in taking care of patient is currently visiting his Mom Emily Steele) in [place] who has stage IV lung cancer.  Sister, Dimitri Ped, returned phone call and stressed concern about her sistergoing home alone because of her intermittent confusion. [name], pt's sister, states that she cannot drive well because she has stage IV bone cancer.

## 2012-08-22 NOTE — Evaluation (Signed)
Physical Therapy Evaluation Patient Details Name: Emily Steele MRN: [id number] DOB: 1955-09-10 Today's Date: 08/22/2012 Time: 2952-8413    PT Assessment / Plan / Recommendation Clinical Impression  57 y/o WF admitted with AMS presents with slight decrease in balance, but primary issue is safety awareness.  Pt believes she is [place] living with mother and brother.  Currently family is all in [place] visiting ill family member.  Do not feel patient is safe to go home alone at this time.   Pt confused and coming out of her hospital  room asking for phone numbers for a ride home and needed to frequently be A back to her room.    PT Assessment  Patient needs continued PT services    Follow Up Recommendations  Home health PT;Other (comment);Supervision/Assistance - 24 hour    Does the patient have the potential to tolerate intense rehabilitation      Barriers to Discharge Decreased caregiver support      Equipment Recommendations  Rolling walker with 5" wheels    Recommendations for Other Services     Frequency Min 3X/week    Precautions / Restrictions     Pertinent Vitals/Pain No c/o pain      Mobility  Bed Mobility Bed Mobility: Supine to Sit Supine to Sit: 7: Independent Transfers Transfers: Sit to Stand Sit to Stand: 6: Modified independent (Device/Increase time) Ambulation/Gait Ambulation/Gait Assistance: 5: Supervision;4: Min guard Ambulation Distance (Feet): 50 Feet Assistive device: None Ambulation/Gait Assistance Details: Pt with 1 LOB and did trial with RW but pt states she doesn't like it.    Exercises     PT Diagnosis: Difficulty walking  PT Problem List: Decreased mobility PT Treatment Interventions: Gait training;Functional mobility training;Therapeutic activities;Therapeutic exercise;Balance training   PT Goals Acute Rehab PT Goals PT Goal Formulation: With patient Time For Goal Achievement: 08/29/12 Potential to Achieve Goals: Good Pt  will go Sit to Stand: Independently PT Goal: Sit to Stand - Progress: Goal set today Pt will Ambulate: >150 feet;with modified independence;with least restrictive assistive device PT Goal: Ambulate - Progress: Goal set today Pt will Go Up / Down Stairs: Flight;with supervision PT Goal: Up/Down Stairs - Progress: Goal set today  Visit Information  Last PT Received On: 08/22/12    Subjective Data  Subjective: "Why are people trying to fool me and make me think I am in [place]?" Patient Stated Goal: To go home.   Prior Functioning  Home Living Lives With: Other (Comment) (pt thinks she is [place] taking care of mother) Type of Home: House Home Layout: Laundry or work area in basement;One level Bathroom Shower/Tub: Engineer, manufacturing systems: Standard Additional Comments: Pt told therapist she lived with mother and brother, but after speaking with nurse and nurse called family discovered that family is in [place] visiting ill mother Prior Function Level of Independence: Independent Able to Take Stairs?: Yes Driving: Yes Comments: Unsure of patient's reliability    Cognition  Cognition Overall Cognitive Status: Impaired/Different from baseline Area of Impairment: Awareness;Orientation    Extremity/Trunk Assessment     Balance Balance Balance Assessed: Yes Dynamic Standing Balance Dynamic Standing - Balance Support: No upper extremity supported Dynamic Standing - Level of Assistance: 5: Stand by assistance  End of Session PT - End of Session Equipment Utilized During Treatment: Gait belt Activity Tolerance: Patient tolerated treatment well Patient left: in bed Nurse Communication: Mobility status  GP     Shakera Ebrahimi LUBECK 08/22/2012, 4:39 PM

## 2012-08-22 NOTE — Progress Notes (Signed)
Consult received for HHPT/RN services, choice offered to pt, she chose [facility], referral made.

## 2012-08-22 NOTE — Progress Notes (Signed)
MRI of patient reviewed, clinical course over last 2 days reviewed.  MRI consistent with meningioma but concern for encephalitis.  Clinically, confusion has resolved, no fever, CSF with only a few WBCs and mainly lymphocytes which certainly could be from meningioma.  I do not suspect active encephalitis and regardless, she is clinically improving and would be self limited since HSV is negative.  No antiviral therapy indicated and she will need further neurology management for presumed meningioma.

## 2012-08-23 DIAGNOSIS — F339 Major depressive disorder, recurrent, unspecified: Secondary | ICD-10-CM

## 2012-08-23 NOTE — Progress Notes (Signed)
Discharge instructions given to pt, verbalized understanding. Left the unit in stable condition. 

## 2012-08-23 NOTE — Progress Notes (Signed)
Patient seen and examined. She was scheduled for discharge home yesterday, however after discussing with her brother there was some concern about safety and competency. Her discharge was held in order to obtain a psychiatry consultation. This has been done today. I have personally spoken with Dr.Jonalaggada who tells me that patient does have decision-making capacity and that she is on appropriate  medications for her mental health illness. Above has been discussed with patient's brother [name]. In addition her insurance will not cover a stay at ALF. She will be discharged home today. Discharge summary and diagnoses as well as medications remained the same as dictated 08/22/2012.  Peggye Pitt, MD [facility] Pager: [phone]

## 2012-08-23 NOTE — Evaluation (Signed)
Occupational Therapy Evaluation Patient Details Name: Emily Steele MRN: [id number] DOB: 05-18-1955 Today's Date: 08/23/2012 Time: 4098-1191 OT Time Calculation (min): 14 min  OT Assessment / Plan / Recommendation Clinical Impression  Pt admitted with AMS and currently is supervision level with ADL. No family present and per PT notes yesterday, pt thought she was in [place] living with family. Today pt states she lives alone in [place]. No skilled OT needs but do feel she needs supervsion at discharge for safety.    OT Assessment       Follow Up Recommendations  No OT follow up;Supervision/Assistance - 24 hour    Barriers to Discharge      Equipment Recommendations  None recommended by OT    Recommendations for Other Services    Frequency       Precautions / Restrictions Precautions Precautions: None Restrictions Weight Bearing Restrictions: No        ADL  Eating/Feeding: Independent Where Assessed - Eating/Feeding: Edge of bed Grooming: Teeth care;Supervision/safety Where Assessed - Grooming: Unsupported standing Upper Body Bathing: Chest;Right arm;Left arm;Abdomen;Supervision/safety;Other (comment) (own setup) Where Assessed - Upper Body Bathing: Unsupported sitting Lower Body Bathing: Supervision/safety Where Assessed - Lower Body Bathing: Supported sit to stand Upper Body Dressing: Supervision/safety Where Assessed - Upper Body Dressing: Unsupported sitting Lower Body Dressing: Supervision/safety Where Assessed - Lower Body Dressing: Supported sit to stand Toilet Transfer: Supervision/safety Statistician Method: Other (comment) (no device into bathroom) Acupuncturist: Comfort height toilet Toileting - Clothing Manipulation and Hygiene: Supervision/safety Where Assessed - Engineer, mining and Hygiene: Sit to stand from 3-in-1 or toilet Tub/Shower Transfer: Supervision/safety ADL Comments: Pt today able to state she is in [place] and [place]. She states she lives alone and has a neighbor that can check on her. She does not make any mention of family in [place]. Unsure of reliability of all info given. Feel pt will need supervisoni at discharge and pt currently at supervision level. No skilled OT needs identified.     OT Diagnosis:    OT Problem List:   OT Treatment Interventions:     OT Goals    Visit Information  Last OT Received On: 08/23/12 Assistance Needed: +1    Subjective Data  Subjective: you want me to get up and show you brushing my teeth? Patient Stated Goal: wants to discharge   Prior Functioning     Home Living Lives With: Alone Available Help at Discharge: Neighbor Type of Home: House Home Layout: One level;Laundry or work area in basement [employer] Shower/Tub: Engineer, manufacturing systems: Handicapped height Prior Function Level of Independence: Independent Able to Take Stairs?: Yes Driving: Yes Communication Communication: No difficulties         Vision/Perception     Cognition  Cognition Arousal/Alertness: Awake/alert Behavior During Therapy: WFL for tasks assessed/performed Overall Cognitive Status: No family/caregiver present to determine baseline cognitive functioning Area of Impairment:  (able to state [employer]) General Comments: per PT note yesterday, pt thought she is in [place] taking care of her mother. today she is stating she lives alone in [place] and makes no mention of any familiy other than a sister that lives an hour and a half away. Not sure of reliability of info given and who is actually available to assist/provide supervision.    Extremity/Trunk Assessment Right Upper Extremity Assessment RUE ROM/Strength/Tone: [facility] for tasks assessed Left Upper Extremity Assessment LUE ROM/Strength/Tone: [facility] for tasks assessed     Mobility Bed  Mobility Bed Mobility: Supine to Sit Supine to Sit: 6: Modified independent (Device/Increase  time) Transfers Transfers: Sit to Stand;Stand to Sit Sit to Stand: 6: Modified independent (Device/Increase time);From bed;From toilet Stand to Sit: 6: Modified independent (Device/Increase time);To bed;To toilet     Exercise     Balance Balance Balance Assessed: Yes Dynamic Standing Balance Dynamic Standing - Level of Assistance: 5: Stand by assistance   End of Session OT - End of Session Activity Tolerance: Patient tolerated treatment well Patient left: in bed;with call bell/phone within reach;with bed alarm set  GO     Emily Steele [phone] 08/23/2012, 9:49 AM

## 2012-08-23 NOTE — Consult Note (Signed)
Reason for Consult: depression Referring Physician: Dr. [name] is an 57 y.o. female.  HPI: Patient was seen and chart reviewed. Patient reported she has been diagnosed with major depressive disorder and concentration problems. Patient has been seeing the outpatient psychiatric services and receiving medication management. Patient has denied current symptoms of depression, anxiety, psychosis and mania. Patient has denied head was affect other medications. Patient denied season onset ideation intention or plans and says no evidence of psychotic symptoms. Patient has intact cognitions including orientation concentration, immediate and delayed memory and recall and fund of knowledge. Patient reported she has difficulty concentration when she was cannot of her medication about a month ago.  Mental Status Examination: Patient appeared as per his stated age, overweight and fairly groomed, and maintaining good eye contact. Patient has fine mood and his affect was appropriate and bright. He has normal rate, rhythm, and volume of speech. His thought process is linear and goal directed. Patient has denied suicidal, homicidal ideations, intentions or plans. Patient has no evidence of auditory or visual hallucinations, delusions, and paranoia. Patient has fair insight judgment and impulse control.  Past Medical History  Diagnosis Date  . Depression   . Fibromyalgia   . Hypothyroid   . Sleep behavior disorder, REM   . Osteopenia   . Obesity   . Plantar fasciitis   . Arthritis   . GERD (gastroesophageal reflux disease)   . Anemia   . Hypertension   . Osteoporosis   . Rosacea   . Eczema   . Fibromyalgia   . UTI (urinary tract infection)   . Hx of migraine headaches   . Constipation     Past Surgical History  Procedure Laterality Date  . Tonsillectomy and adenoidectomy    . Abdominal hysterectomy    . Spine surgery      Family History  Problem Relation Age of Onset  . Cancer  Mother   . Hypertension Mother   . Cancer Sister   . Heart disease Sister   . Asthma Sister   . Asthma Brother   . Drug abuse Brother   . Hypertension Brother   . Drug abuse Daughter     Social History:  reports that she has never smoked. She does not have any smokeless tobacco history on file. She reports that she does not drink alcohol or use illicit drugs.  Allergies:  Allergies  Allergen Reactions  . Doxycycline Other (See Comments)    headaches  . Sulfa Antibiotics     Headaches  . Methocarbamol Nausea Only    headaches    Medications: I have reviewed the patient's current medications.  No results found for this or any previous visit (from the past 48 hour(s)).  Mr Emily Steele WU Contrast  08/21/2012  *RADIOLOGY REPORT*  Clinical Data: Brain mass.  Abnormal MRI.  Altered mental status.  MRI HEAD WITHOUT AND WITH CONTRAST  Technique:  Multiplanar, multiecho pulse sequences of the brain and surrounding structures were obtained according to standard protocol without and with intravenous contrast  Contrast: 18mL MULTIHANCE GADOBENATE DIMEGLUMINE 529 MG/ML IV SOLN  Comparison: MRI brain without contrast 08/19/2012 and MRI brain without and with contrast 08/17/2012.  Findings: A solid enhancing lateral mass lesion in the right temporal lobe is again noted, measuring 1.5 x 1.4 cm.  This lesion appears to be extra-axial and is most compatible with a meningioma. Encephalomalacia of the right temporal lobe and diffuse white matter signal abnormality is again noted,  without significant change there is clearly enhancement at the periphery of the residual parenchyma.  The hippocampal structures thinned.  There is no evidence for hemorrhage.  Abnormal signal is present within the insular cortex bilaterally without evidence for acute infarct.  Flow is present in the major intracranial arteries.  Diffuse sinus disease is again noted.  IMPRESSION:  1.  Diffuse encephalomalacia of the right temporal lobe.   The HSV PCR is negative.  This may represent a remote injury due to herpes simplex.  A more typical viral encephalitis is still considered. Abnormal white cells with a lymphocytic predominance are noted in the CSF. 2.  Diffuse white matter signal in the right temporal lobe may be chronic. 3.  Abnormal signal along the insular cortex bilaterally is compatible with encephalitis.  Active disease remains a concern. 4.  No evidence for acute infarct or hemorrhage. 5.  The solid enhancing lesion is more clearly extra-axial and compatible with a meningioma. 6.  Diffuse sinus disease.   Original Report Authenticated By: Emily Steele, M.D.     Positive for anxiety, bad mood, behavior problems, depression, mood swings and sleep disturbance Blood pressure 125/80, pulse 72, temperature 97.6 F (36.4 C), temperature source Oral, resp. rate 18, height 5\' 1"  (1.549 m), weight 194 lb (87.998 kg), SpO2 99.00%.   Assessment/Plan: Major depressive disorder, recurrent  Recommendation: Patient has been stable on her current medication management without adverse effects. Patient does not meet criteria of acute psychiatric hospitalization and will be referred to [facility] upon discharge for out patient psychiatric services. Patient meets criteria for capacity to make her own medical decisions.     Emily Steele,Emily Steele. 08/23/2012, 12:41 PM

## 2012-08-23 NOTE — Progress Notes (Signed)
Late Entry for 08/22/12: Patient was scheduled to be discharged home. She seemed alert and oriented. When her discharge had been processed she started asking for phone numbers of her family members to give her a ride home. She seemed confused and thinking that she was in [place] which is where her family lives. Spoke at length with the patient's brother [name], he believes that the main issue going on is that her mental health illness has been undertreated as she is not taking her medications. Family has great concerns about her living independently. I have discussed brothers concerns with patient and she has agreed to look into assisted living facilities. Will ask for psychiatry consult today. Per brother patient has a history of schizoaffective disorder and possibly bipolar. Discharge will be canceled for today.  Peggye Pitt, MD [facility] Pager: [phone]

## 2012-08-27 NOTE — Telephone Encounter (Signed)
Lyla Son from [facility] called wanting to speak to Dr Audria Nine regarding patient [place]. Lyla Son states that they will not be able to admit patient into the home health due to her having major psychiatric issues. Instead they will need to contact adult protective services. Lyla Son would like a call back asap [phone]. Thanks

## 2012-08-28 ENCOUNTER — Telehealth: Payer: Self-pay | Admitting: Family Medicine

## 2012-08-28 NOTE — Telephone Encounter (Signed)
I left a message for Emily Steele; I will try to call again later this afternoon.

## 2012-08-28 NOTE — Telephone Encounter (Signed)
I spoke with an individual at  [employer] [employer] [facility] ([facility]) who reports that this case has been referred to one of the case workers- [employer]- who has a 72-hour window to activate. I reviewed the situation w/ her and advised her of my concerns about this individual. She will contact the case worker and get back to me about the status of this case.  At 3:45 PM [facility] called back. Ardine Eng is the social worker who spoke with the Systems developer; the earliest he can go to the home is tomorrow(08/29/12) unless I think this is an emergency and pt is in imminent danger. In that case, they would go to the home and assess the pt and call 911 if the situation warrants. The pt will either be transported to hospital or have involuntary commitment if she has significant mental instability. I informed her that I would try  To call the pt myself and assess her mental state and call [employer] back with a decision about urgency of this situation.  At 4:05 PM, spoke with Mr. Renelda Mom who had arrived at pt's home to do an assessment. I was on the phopne with the pt who admits confusion but states she was taking her medications. She also reports having fallen twice since being home. She was planning to drive to [place] to visit her mother (who has lung cancer); pt has been visiting w/ her at intervals over the last 6-9 months. Mr. Valentina Lucks will assess the situation and contact 911 if he feels pt is in imminent danger or he will suggest best management of this situation (within his authority). He requests pt's med list and problem list be faxed to Ardine Eng at [phone]. This info will help him with assessment.  Mr. Jone Baseman cell phone # [phone].

## 2012-08-28 NOTE — Telephone Encounter (Signed)
[facility]- Mr. Emily Steele completed an assessment of pt and found her to be stable and in no immediate danger or harm to self. She does seem to be mildly confused but is able to cooperate w/ assessment and has a neighbor who is cooking for her. Pt will not be traveling to [place] to see mother. Pt ambulating slowly but is steady on her feet. Mr. Emily Steele will try to see pt again on Friday 08/30/2012. Pt has an appt w/ me on 09/06/2012. I will try to check on her via telephone on tomorrow.

## 2012-08-28 NOTE — Telephone Encounter (Signed)
There have been several phone calls today w/ [facility] Nurse and Adult Protective Services. These phone calls are connected to phone calls dates 08/14/2012. To see the correspondence, it is necessary to look at "Encounters" for telephone calls on that date.

## 2012-08-28 NOTE — Telephone Encounter (Signed)
I spoke with [facility] w/ [facility]; her agency can not do home health due to psychiatric issues. Lyla Son did assess the situation and found that there are safety concerns. Pt is not mentally stable; discharge planning at hospital recommended skilled nursing but pt's insurance would not cover this so pt was sent home. She is not oriented to place (she told Lyla Son she was not in [place]), has unlabelled medicine containers and may be a threat to her own safety. Pt's brother did accompany her home, took the pt's dog and returned to his home in another city. He would not or could not assume responsibility for her care Lyla Son contacted Adult Protective Services and spoke w/ Mr. Manuela Neptune who will be investigating this case ph# [phone]  (after hours #: [phone]).  At 3:10 PM, I called Adult [employer] Services to speak w/ Manuela Neptune about this pt or to try to get a status report re: referral of this pt to  [facility]. I left a message.

## 2012-08-29 ENCOUNTER — Telehealth: Payer: Self-pay | Admitting: Family Medicine

## 2012-08-29 NOTE — Telephone Encounter (Signed)
I called pt to see how she is managing today. She is a little confused; she says she is staying with her mothr9 her mother lives in [place]). Pt says she is taking her meds, has had 2 meals and is staying hydrated. She wants to move her appt date up; currently sheis sch to come in 09/06/2012. I will leave a message with Daine Floras to call pt and reschedule to earlier appt.

## 2012-09-02 ENCOUNTER — Telehealth: Payer: Self-pay

## 2012-09-02 NOTE — Telephone Encounter (Signed)
Pt mom called. Emily Steele is a pt of Dr. Audria Nine but she is not working today. Apparently pt has been diagnosed with a brain tumor, is living alone and confused. Pt thinks here family is here with her in [place] but they are not. Pt mom has reached out to pt psychiatrist but has not recd a call back. Pt mom would like to know how to get pt back in the hospital, pt claims to not need the help but pt mom is quite concerned about her health and safety.  Elizabeth/mom   [phone]

## 2012-09-02 NOTE — Telephone Encounter (Signed)
Called to advise we are trying to get her in sooner with Dr Audria Nine.   If she is concerned she is in danger, we can call an ambulance for her. Left message for her to call me back.

## 2012-09-03 NOTE — Telephone Encounter (Signed)
Left msg for pt 5/19. Appt scheduled is for 5/23, can get her in 5/22 if she is available.

## 2012-09-06 ENCOUNTER — Ambulatory Visit (INDEPENDENT_AMBULATORY_CARE_PROVIDER_SITE_OTHER): Payer: BC Managed Care – PPO | Admitting: Family Medicine

## 2012-09-06 ENCOUNTER — Encounter: Payer: Self-pay | Admitting: Family Medicine

## 2012-09-06 VITALS — BP 130/86 | HR 74 | Temp 97.7°F | Resp 16 | Ht 59.0 in | Wt 185.2 lb

## 2012-09-06 DIAGNOSIS — Z9181 History of falling: Secondary | ICD-10-CM

## 2012-09-06 DIAGNOSIS — B004 Herpesviral encephalitis: Secondary | ICD-10-CM

## 2012-09-06 DIAGNOSIS — M5137 Other intervertebral disc degeneration, lumbosacral region: Secondary | ICD-10-CM

## 2012-09-06 DIAGNOSIS — R269 Unspecified abnormalities of gait and mobility: Secondary | ICD-10-CM

## 2012-09-06 DIAGNOSIS — F09 Unspecified mental disorder due to known physiological condition: Secondary | ICD-10-CM

## 2012-09-06 NOTE — Patient Instructions (Signed)
I will be ordering a referral to Neurology for follow-up post-encephalitis. I will also try to get physical therapy set up for pt to work on gait disturbance. You have a prescription for a walkeer that you can get from medical supplier locally. I will see you again in ~6 weeks.

## 2012-09-06 NOTE — Telephone Encounter (Signed)
Pt kept original appt and was seen 09/06/12.

## 2012-09-10 NOTE — Progress Notes (Signed)
S:  This 57 y.o. 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 Review of hospital notes shows pt has hx of Schizoaffective and Bipolar Disorder. These illnesses are monitored and managed by Dr. Evelene Croon (Psychiatry). Pt has not seen her recently and is scheduled for follow-up in June. Pt's disposition at discharge was complicated by the fact that Home Health Nurse could not provide in-home care due to pt's hx of psychiatric illness. The pt was discharged home with brother Emily Steele unable to remain in home with pt, who was still confused and unsteady re: ambulation. Adult Protective Services was involved (see phone notes) and case worker Renelda Mom visited the pt in her home; he has since been in touch with the family and plans are in place for aftercare of pt in her home. Pt's neighbor, who has nursing experience, is helping w/ meal preparation and medication administration. Pt's automobile keys have been turned over to this neighbor. Emily Steele states there are a lot of pill bottles in the home; pill planner is full of pills so he is not certain what actual medications the pt should be taking. Pt's confusion is still apparent and brother is concerned about how long this will last. We discussed follow-up w/ Neurology.  Going forward, Emily Steele will try to be available for medical visits (w/ adequate notice); provided w/ a list of current medications, he will review supply of medications in the home and clarify this for pt and neighbor/caregiver. Pt has fallen a few times; she is unsteady on her feet and would like to resume PT. It has been made clear to her that she can not drive; she voices understanding of this. Use of cane or walker discussed; pt has neither at home. Gait disturbance is not new; pt had lumbar surgery (L5-S1) in 2009 performed by Dr. Wynetta Emery. She last had MRI at that time; it showed multi-level DDD. She  does report strange sensation in bottoms of feet when she is walking. Falls have not been accompanied by LOC or trauma to head or neck.  PMHx, Soc Hx and Fam Hx reviewed. Problem List reviewed.  ROS: As per HPI. Pt denies fver/ chills, loss of appetite, abnormal weight change, neck pain or stiffness, difficulty swallowing or hoarseness, vision changes, CP or tighntess, palpitations, SOB or DOE, cough, GI problems, myalgias, HA, seizure, tremor or syncope.  O: Filed Vitals:   09/06/12 1030  BP: 130/86  Pulse: 74  Temp: 97.7 F (36.5 C)  Resp: 16   GEN: In NAD; WN,WD. HENT: [place]/AT; EOMI w/ clear conj/ sclerae. EACs/nose/ oroph normal. NECK: No LAN or TMG. COR: RRR. No edema. LUNGS: CTA; no wheezes or rhonchi. Normal resp rate and effort. SKIN: W&D. No rashes or pallor. MS: MAEs; no joint effusions or erythema. No deformities. No muscle atrophy. BACK: Straight spine; tender paravertebral muscles. Forward flexion- limited. NEURO: A&O x 3. Pt did make a statement suggesting she thought she was in [place]. She has gait abnormality- slightly  Ataxic. Pt                          cannot tandem walk or walk on toes or heels. DTRs difficult to illicit.  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   Plan: Ambulatory referral to Neurology  Cognitive dysfunction - Chronicity associated w/ psychiatric illness. Pt has appt w/ Dr. Evelene Croon to review  medications and management in light of recent CNS insult.     Plan: Ambulatory referral to Neurology  Risk for falls- Refer back to PT; brother given RX: walker.  DDD (degenerative disc disease), lumbosacral- consider further evaluation (MRI?) at next visit.  Gait disturbance- multi-factorial. Further evaluation needed.  Brother,Jeff, given AVS which clarifies current medications.

## 2012-09-18 ENCOUNTER — Telehealth: Payer: Self-pay

## 2012-09-18 DIAGNOSIS — Z9181 History of falling: Secondary | ICD-10-CM

## 2012-09-18 NOTE — Telephone Encounter (Signed)
Patient has a question about referral. [phone]

## 2012-09-18 NOTE — Telephone Encounter (Signed)
Referral indicates home health PT, but has been [facility] to [facility]. I have put in order for home health PT eval and treat.

## 2012-09-18 NOTE — Telephone Encounter (Signed)
I called to advise order was sent to [facility], but not until today. She wants to check on the Neurology appt also, will you please check on this for her?

## 2012-10-24 ENCOUNTER — Encounter: Payer: Self-pay | Admitting: Neurology

## 2012-10-24 ENCOUNTER — Ambulatory Visit (INDEPENDENT_AMBULATORY_CARE_PROVIDER_SITE_OTHER): Payer: BC Managed Care – PPO | Admitting: Neurology

## 2012-10-24 VITALS — BP 129/90 | HR 79 | Temp 98.4°F | Ht 59.5 in | Wt 181.0 lb

## 2012-10-24 DIAGNOSIS — D320 Benign neoplasm of cerebral meninges: Secondary | ICD-10-CM

## 2012-10-24 DIAGNOSIS — A86 Unspecified viral encephalitis: Secondary | ICD-10-CM

## 2012-10-24 DIAGNOSIS — F319 Bipolar disorder, unspecified: Secondary | ICD-10-CM

## 2012-10-24 DIAGNOSIS — A89 Unspecified viral infection of central nervous system: Secondary | ICD-10-CM

## 2012-10-24 DIAGNOSIS — D329 Benign neoplasm of meninges, unspecified: Secondary | ICD-10-CM

## 2012-10-24 NOTE — Patient Instructions (Addendum)
I think overall you are doing fairly well but I do want to suggest a few things today:  Remember to drink plenty of fluid, eat healthy meals and do not skip any meals. Try to eat protein with a every meal and eat a healthy snack such as fruit or nuts in between meals. Try to keep a regular sleep-wake schedule and try to exercise daily, particularly in the form of walking, 20-30 minutes a day, if you can.   Engage in social activities in your community and with your family and try to keep up with current events by reading the newspaper or watching the news.   As far as your medications are concerned, I would like to suggest no new meds.   As far as diagnostic testing: we will monitor your meningioma with serial MRIs. You are due for an MRI in October.   I would like to see you back in 3 months, sooner if we need to. Please call us with any interim questions, concerns, problems, updates or refill requests.  Please also call us for any test results so we can go over those with you on the phone. Brett Canales is my clinical assistant and will answer any of your questions and relay your messages to me and also relay most of my messages to you.  Our phone number is [phone]. We also have an after hours call service for urgent matters and there is a physician on-call for urgent questions. For any emergencies you know to call 911 or go to the nearest emergency room.

## 2012-10-24 NOTE — Progress Notes (Signed)
Subjective:    Patient ID: Emily Steele is a 57 y.o. female.  HPI  Huston Foley, MD, PhD [facility] [address] [address] [place], [place] [address]  Dear Dr. Audria Nine,   I saw your patient, Emily Steele on the [facility], upon your kind request in my neurologic clinic today for initial consultation of her history of altered mental status in the context of viral  encephalitis. She's accompanied by her brother today. 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 She has an underlying psychiatric history of schizoaffective disorder and bipolar affective disorder. She sees Dr. Evelene Croon for this. She was discharged home with her brother and continued to have problems with ambulation. Her brother was unable to supervise her 24-7 and because she was still confused and had unsteady gait Adult Protective Services got involved. She has a case worker, Renelda Mom; he checked on her in June, but he has not been [employer] as previously arranged. Patient has fallen a few times. She was referred to physical therapy by you when you saw her on 09/06/2012. She is status post lumbar surgery. Her last brain MRI w and w/o contrast from 08/21/12: Diffuse encephalomalacia of the right temporal lobe. The HSV PCR is negative. This may represent a remote injury due to herpes simplex. A more typical viral encephalitis is still considered. Abnormal white cells with a lymphocytic predominance are noted in the CSF. 2. Diffuse white matter signal in the right temporal lobe may be chronic. 3. Abnormal signal along the insular cortex bilaterally is compatible with encephalitis. Active disease remains a concern. 4. No evidence for acute infarct or hemorrhage. 5. The solid enhancing lesion is more clearly extra-axial and  compatible with a meningioma. 6. Diffuse sinus disease. An EEG from 08/20/12 showed: mild nonspecific generalized continuous slowing of cerebral activity. There was no disproportionate focal slowing involving the right temporal region. No evidence of an epileptic disorder was demonstrated.  She was treated for a UTI as well as CAP with ABx and as the HSV PCR came back negative, acyclovir was discontinued. She was admitted the first week of May. There were changes in her personality dating back to February, when she stopped all her meds, including her psych meds.  She is now taking her psych meds and seems back to her baseline per [name], her brother. Her other brother Emily Steele or her sister Emily Steele have health care POA.  She had L-spine surgery in 2004 and has had recurrence of sciatica. She was seen by NSG in the hospital and was told there was no need for surgery. She had some hallucinations, but not in the last 3 weeks.   Her Past Medical History Is Significant For: Past Medical History  Diagnosis Date  . Depression   . Fibromyalgia   . Hypothyroid   . Sleep behavior disorder, REM   . Osteopenia   . Obesity   . Plantar fasciitis   . Arthritis   . GERD (gastroesophageal reflux disease)   . Anemia   . Hypertension   . Osteoporosis   . Rosacea   . Eczema   . Fibromyalgia   . UTI (urinary tract infection)   . Hx of migraine headaches   . Constipation   . Anxiety   . Allergy     Her Past Surgical History Is Significant  For: Past Surgical History  Procedure Laterality Date  . Tonsillectomy and adenoidectomy    . Abdominal hysterectomy    . Spine surgery      Her Family History Is Significant For: Family History  Problem Relation Age of Onset  . Cancer Mother   . Hypertension Mother   . Cancer Sister   . Heart disease Sister   . Asthma Sister   . Asthma Brother   . Drug abuse Brother   . Hypertension Brother   . Drug abuse Daughter     Her Social History Is Significant  For: History   Social History  . Marital Status: Single    Spouse Name: N/A    Number of Children: N/A  . Years of Education: college   Social History Main Topics  . Smoking status: Never Smoker   . Smokeless tobacco: None  . Alcohol Use: No  . Drug Use: No  . Sexually Active: No   Other Topics Concern  . None   Social History Narrative   Single. Education: [employer].  Exercises 4x's a week.    Her Allergies Are:  Allergies  Allergen Reactions  . Doxycycline Other (See Comments)    headaches  . Sulfa Antibiotics     Headaches  . Methocarbamol Nausea Only    headaches  :   Her Current Medications Are:  Outpatient Encounter Prescriptions as of 10/24/2012  Medication Sig Dispense Refill  . amphetamine-dextroamphetamine (ADDERALL XR) 30 MG 24 hr capsule Take 30 mg by mouth 3 (three) times daily.       Marland Kitchen buPROPion (WELLBUTRIN SR) 150 MG 12 hr tablet Take 150 mg by mouth 2 (two) times daily.      . DULoxetine (CYMBALTA) 60 MG capsule Take 60 mg by mouth 2 (two) times daily.      Marland Kitchen gabapentin (NEURONTIN) 100 MG capsule Take 100 mg by mouth 3 (three) times daily.      Marland Kitchen levothyroxine (SYNTHROID, LEVOTHROID) 50 MCG tablet Take 1 tablet (50 mcg total) by mouth daily.  90 tablet  1  . lidocaine (LIDODERM) 5 % Place 1 patch onto the skin as needed. Remove & Discard patch within 12 hours or as directed by MD      . ranitidine (ZANTAC) 150 MG capsule Take 1 capsule (150 mg total) by mouth 2 (two) times daily.  180 capsule  0  . temazepam (RESTORIL) 15 MG capsule Take 15 mg by mouth daily.       No facility-administered encounter medications on file as of 10/24/2012.  : Review of Systems  Constitutional: Positive for appetite change.  HENT: Positive for hearing loss.   Eyes:       Loss of vision  Endocrine: Positive for heat intolerance.  Musculoskeletal:       Aching Muscles  Neurological: Positive for headaches.       Confusion,Memory loss  Psychiatric/Behavioral: Positive  for sleep disturbance.       Disinterest in activites    Objective:  Neurologic Exam  Physical Exam Physical Examination:   Filed Vitals:   10/24/12 1358  BP: 129/90  Pulse: 79  Temp: 98.4 F (36.9 C)    General Examination: The patient is a very pleasant 57 y.o. female in no acute distress. She appears well-developed and well-nourished and adequately groomed.   HEENT: Normocephalic, atraumatic, pupils are equal, round and reactive to light and accommodation. Funduscopic exam is normal with sharp disc margins noted. Extraocular tracking is good without limitation to  gaze excursion or nystagmus noted. Normal smooth pursuit is noted. Hearing is grossly intact. Tympanic membranes are clear bilaterally. Face is symmetric with normal facial animation and normal facial sensation. Speech is clear with no dysarthria noted. There is no hypophonia. There is no lip, neck/head, jaw or voice tremor. Neck is supple with full range of passive and active motion. There are no carotid bruits on auscultation. Oropharynx exam reveals: mild mouth dryness, adequate dental hygiene and mild airway crowding, due to narrow airway. Mallampati is class II. Tongue protrudes centrally and palate elevates symmetrically.    Chest: Clear to auscultation without wheezing, rhonchi or crackles noted.  Heart: S1+S2+0, regular and normal without murmurs, rubs or gallops noted.   Abdomen: Soft, non-tender and non-distended with normal bowel sounds appreciated on auscultation.  Extremities: There is no pitting edema in the distal lower extremities bilaterally. Pedal pulses are intact.  Skin: Warm and dry without trophic changes noted. There are no varicose veins.  Musculoskeletal: exam reveals no obvious joint deformities, tenderness or joint swelling or erythema.   Neurologically:  Mental status: The patient is awake, alert and oriented in all 4 spheres. Her memory, attention, language and knowledge are appropriate with  the exception of mild psychomotor retardation. Her MMSE was /30, her CDT was 4/4, AFT was 14.There is no aphasia, agnosia, apraxia or anomia. Speech is clear with normal prosody and enunciation. Thought process is linear. Mood is congruent and affect is blunted.  Cranial nerves are as described above under HEENT exam. In addition, shoulder shrug is normal with equal shoulder height noted. Motor exam: Normal bulk, strength and tone is noted. There is no drift, tremor or rebound. Romberg is negative. Reflexes are 2+ throughout. Toes are downgoing bilaterally. Fine motor skills are intact with normal finger taps, normal hand movements, normal rapid alternating patting, normal foot taps and normal foot agility.  Cerebellar testing shows no dysmetria or intention tremor on finger to nose testing. There is no truncal or gait ataxia.  Sensory exam is intact to light touch, pinprick, vibration, temperature sense and proprioception in the upper and lower extremities.  Gait, station and balance: she stands up slowly and c/o LBP radiating to the L. She walks cautiously are unremarkable. No veering to one side is noted. No leaning to one side is noted. Posture is age-appropriate and stance is narrow based. No problems turning are noted. She turns en bloc. Tandem walk is difficulty.                Assessment and Plan:   In summary, LAMEKA DISLA is a very pleasant 57 y.o.-year old female with a history of encephalitis, likely viral. She had an incidental meningioma Dx as well. She has been back on her psych meds. She has a fairly non-focal today, she had some issue with remote recall. She has been advised to start driving cautiously, only on local 35 mile zone roads. No driving at night, no highways, no busy main roads. She is advised to bring up with you the possibility to have [facility] nursing to help keep tabs on her meds. She is scheduled to have another brain MRI in October. She is scheduled for PT, which she is going  to start soon.  I had a long chat with the patient and her brother about my findings and the diagnosis of encephalitis, the prognosis and treatment options. We talked about medical treatments and non-pharmacological approaches. We talked about maintaining a healthy lifestyle in general. I encouraged  the patient to eat healthy, exercise daily and keep well hydrated, to keep a scheduled bedtime and wake time routine, to not skip any meals and eat healthy snacks in between meals and to have protein with every meal.   As far as further diagnostic testing is concerned, I suggested the following today: no new test.  As far as medications are concerned, I recommended the following at this time: no change.  I answered all their questions today and the patient and her were in agreement with the above outlined plan. I would like to see the patient back in 3 months, sooner if the need arises and encouraged them to call with any interim questions, concerns, problems or updates.   Thank you very much for allowing me to participate in the care of this nice patient. If I can be of any further assistance to you please do not hesitate to call me at [phone].  Sincerely,   Huston Foley, MD, PhD

## 2012-10-25 ENCOUNTER — Ambulatory Visit: Payer: BC Managed Care – PPO | Admitting: Family Medicine

## 2012-10-25 ENCOUNTER — Ambulatory Visit (INDEPENDENT_AMBULATORY_CARE_PROVIDER_SITE_OTHER): Payer: BC Managed Care – PPO | Admitting: Family Medicine

## 2012-10-25 ENCOUNTER — Encounter: Payer: Self-pay | Admitting: Family Medicine

## 2012-10-25 VITALS — BP 118/80 | HR 76 | Temp 98.2°F | Resp 16 | Ht 59.0 in | Wt 180.0 lb

## 2012-10-25 DIAGNOSIS — R3915 Urgency of urination: Secondary | ICD-10-CM

## 2012-10-25 DIAGNOSIS — M79674 Pain in right toe(s): Secondary | ICD-10-CM

## 2012-10-25 DIAGNOSIS — M79609 Pain in unspecified limb: Secondary | ICD-10-CM

## 2012-10-25 DIAGNOSIS — R269 Unspecified abnormalities of gait and mobility: Secondary | ICD-10-CM

## 2012-10-25 LAB — POCT URINALYSIS DIPSTICK
Bilirubin, UA: NEGATIVE
Glucose, UA: NEGATIVE
Leukocytes, UA: NEGATIVE
Nitrite, UA: NEGATIVE
Urobilinogen, UA: 0.2

## 2012-10-25 NOTE — Progress Notes (Signed)
S:  This 57 y.o. Cauc female is here w/ her brother for ongoing follow-up after hospitalization for encephalitis. Post- hosp course has been complicated by gait problem and cognitive issues. Pt and brother report no falls and near-normal ambulation w/o assistive devices. Neurology eval- meningioma is not significant but will be followed w/ MRI in Oct 2014. MMSE performed by that specialist revealed only minor cognitive problems. Pt states neurologist has cleared her for driving. She is compliant w/ medications (per brother); pt has been driving and brother says she is doing well. There are restrictions (no highway or night driving as well as speed limit).  Pt has appt w/ Dr. Evelene Croon today; she will review medications for ADD and other mental health issues.  Pt c/o urinary urgency but no fever/ chills, hematuria or flank pain. She maintains good hydration.  PMHx, Soc Hx and Problem List reviewed.  ROS: As per HPI. R foot has swollen tender toe; no hx of trauma.  O: Filed Vitals:   10/25/12 1014  BP: 118/80  Pulse: 76  Temp: 98.2 F (36.8 C)  Resp: 16   GEN: in NAD; WN,WD. Appears in good health today (not chronically ill). HENT: [place]/AT; EOMI w/ clear conj/ sclerae. EACs/nose/ oroph clear and moist. COR: RRR. LUNGS: CTA; Normal resp rate and effort. SKIN: W&D; no rashes or pallor. MS: MAEs; no deformities or joint effusions. R-foot- middle toe is swollen and slightly red; tender to touch. NEURO: A&O x 3; Cns intact. Motor/sensory intact. Gait is fairly steady w/o assistance.  A/P: Abnormality of gait- Improved; PT pending on outpt basis now that pt has clearance to drive from neurologist. Pt will follow-up w/ neurologist in 3 months as she continues to recover from encephalitis.  Toe pain, right - Plan: Uric Acid  Urinary urgency - Plan: POCT urinalysis dipstick

## 2012-10-25 NOTE — Patient Instructions (Addendum)
Continue all current medications and scheduled appointments with specialists. I will see you again in 3 months or sooner if you have any new problems or worsening of current problems. In general, you are improving and I expect you to continue to recover in the months ahead.  Urinalysis is normal.  SCAT paperwork was faxed back to the [facility] office last month; call that agency to check to see if they received the form.

## 2012-10-26 LAB — URIC ACID: Uric Acid, Serum: 2.8 mg/dL (ref 2.4–7.0)

## 2012-10-27 ENCOUNTER — Encounter: Payer: Self-pay | Admitting: Family Medicine

## 2012-11-12 ENCOUNTER — Other Ambulatory Visit: Payer: Self-pay

## 2012-11-12 MED ORDER — LEVOTHYROXINE SODIUM 50 MCG PO TABS: 50.0000 ug | ORAL_TABLET | Freq: Every day | ORAL | Status: DC

## 2012-11-15 ENCOUNTER — Other Ambulatory Visit: Payer: Self-pay

## 2012-11-15 MED ORDER — RANITIDINE HCL 150 MG PO CAPS: 150.0000 mg | ORAL_CAPSULE | Freq: Two times a day (BID) | ORAL | Status: DC

## 2012-12-24 ENCOUNTER — Telehealth: Payer: Self-pay

## 2012-12-24 ENCOUNTER — Encounter: Payer: Self-pay | Admitting: Neurology

## 2012-12-24 NOTE — Telephone Encounter (Signed)
I would rather she discuss those screenings with [place] and have them scheduled by [place]. We can talk about this at her next appt.

## 2012-12-24 NOTE — Telephone Encounter (Signed)
Please advise 

## 2012-12-24 NOTE — Telephone Encounter (Signed)
Patient states she received a flyer in the mail for for [facility] Screening; states they will be conducting several screenings at a local church for certain health conditions such as osteopetrosis and abdominal aortic aneurisms. Patient wants to know if she should get these services done given her medical history. Please advise. [phone].

## 2012-12-25 NOTE — Telephone Encounter (Signed)
Patient advised.

## 2012-12-30 ENCOUNTER — Telehealth: Payer: Self-pay | Admitting: *Deleted

## 2012-12-30 NOTE — Telephone Encounter (Signed)
I called and LMVM for her to call re: message from Dr. Frances Furbish.

## 2013-01-01 NOTE — Telephone Encounter (Signed)
I called and LMVM for pt on her cell re: Dr. Teofilo Pod response and that no tests per Dr. Frances Furbish, but she needs to contact Dr. Venetia Maxon re: nay other tests (MRI etc relating to meningioma).

## 2013-01-06 ENCOUNTER — Telehealth: Payer: Self-pay | Admitting: Neurology

## 2013-01-07 NOTE — Telephone Encounter (Signed)
The patient is supposed to follow with neurosurgery for her meningioma. The neurosurgeon will order a followup brain MRI as deemed necessary.

## 2013-01-07 NOTE — Telephone Encounter (Signed)
I called pt and explained about Dr. Frances Furbish not ordering any tests.  Pt under impression that Dr. Frances Furbish in charge of this. I informed her after reading all the notes, that I will ask Dr. Frances Furbish about this, (if NS or pcp or her self to order f/u MRI).  Notes do not say.  Please advise, and if NS she has seen Dr. Wynetta Emery previously.  [phone]

## 2013-01-08 ENCOUNTER — Other Ambulatory Visit: Payer: Self-pay | Admitting: *Deleted

## 2013-01-08 DIAGNOSIS — D329 Benign neoplasm of meninges, unspecified: Secondary | ICD-10-CM

## 2013-01-09 NOTE — Telephone Encounter (Signed)
Referral placed by Osborne Casco , CNA on 01-08-13.   I placed copy in referrals.

## 2013-01-31 ENCOUNTER — Ambulatory Visit: Payer: BC Managed Care – PPO | Admitting: Family Medicine

## 2013-02-03 ENCOUNTER — Ambulatory Visit: Payer: BC Managed Care – PPO | Admitting: Neurology

## 2013-02-07 ENCOUNTER — Ambulatory Visit (INDEPENDENT_AMBULATORY_CARE_PROVIDER_SITE_OTHER): Payer: BC Managed Care – PPO | Admitting: Family Medicine

## 2013-02-07 ENCOUNTER — Encounter: Payer: Self-pay | Admitting: Family Medicine

## 2013-02-07 VITALS — BP 130/82 | HR 78 | Temp 97.5°F | Resp 16 | Ht <= 58 in | Wt 180.0 lb

## 2013-02-07 DIAGNOSIS — R7989 Other specified abnormal findings of blood chemistry: Secondary | ICD-10-CM

## 2013-02-07 DIAGNOSIS — G3184 Mild cognitive impairment, so stated: Secondary | ICD-10-CM

## 2013-02-07 DIAGNOSIS — Z79899 Other long term (current) drug therapy: Secondary | ICD-10-CM

## 2013-02-07 DIAGNOSIS — Z23 Encounter for immunization: Secondary | ICD-10-CM

## 2013-02-07 LAB — CBC WITH DIFFERENTIAL/PLATELET
Basophils Relative: 1 % (ref 0–1)
Eosinophils Absolute: 0.1 10*3/uL (ref 0.0–0.7)
Hemoglobin: 14.2 g/dL (ref 12.0–15.0)
MCH: 28.6 pg (ref 26.0–34.0)
MCHC: 34.1 g/dL (ref 30.0–36.0)
Monocytes Absolute: 0.3 10*3/uL (ref 0.1–1.0)
Monocytes Relative: 8 % (ref 3–12)
Neutrophils Relative %: 59 % (ref 43–77)
RDW: 14.7 % (ref 11.5–15.5)

## 2013-02-07 LAB — COMPREHENSIVE METABOLIC PANEL
ALT: 19 U/L (ref 0–35)
AST: 18 U/L (ref 0–37)
Albumin: 4 g/dL (ref 3.5–5.2)
Alkaline Phosphatase: 83 U/L (ref 39–117)
BUN: 15 mg/dL (ref 6–23)
Potassium: 4.7 mEq/L (ref 3.5–5.3)
Sodium: 137 mEq/L (ref 135–145)

## 2013-02-07 NOTE — Patient Instructions (Signed)
As we discussed, you can send Dr. Frances Furbish an e-mail to let her know that you have a clearer understanding of her role in your health care team and that you  Will follow up as she would like just ofr re-check: encephalitis.  I will see you in 7 weeks to review your medical records.

## 2013-02-11 ENCOUNTER — Encounter: Payer: Self-pay | Admitting: Family Medicine

## 2013-02-12 ENCOUNTER — Encounter: Payer: Self-pay | Admitting: Family Medicine

## 2013-02-12 NOTE — Progress Notes (Signed)
S:  This 57 y.o. Cauc female follows up today to review medications and to monitor recovery from Herpetic encephalitis and hospitalization in May 2014. Pt has been seen in consultation by Dr. Frances Furbish (neurologist) with last visit in July 2014. Pt corresponded w/ that specialist via e-mail to inquire about additional testing/imaging pertaining to meningioma; pt was informed that Dr. Venetia Maxon (neurosurgeon) would be the specialist to determine what additional evaluation would be needed. Pt is confused about 2 different specialists evaluating 2 different medical issues; I explained that Dr. Frances Furbish is not responsible for evaluation of meningioma. I briefly reviewed Dr. Teofilo Pod last detailed progress note which outlines no new tests or medication changes w/ follow-up in 3 months.  Pt continues to make progress in cognitive functions; she is driving and is independent in ADLs. Pt had to be corrected in her thinking that I had come to visit her at home after hospital discharge; I explained that, though I did not personally visit her, I did authorize other health care workers to come to her home to check on her. She eventually understood this and realized that the viral infection of her brain had deeply affected her thinking.  Pt has reviewed on-line medical records and has found a few "discrepancies" that she would like to discuss at next visit.  PMHx, Soc Hx and Problem List reviewed.  Medications reconciled.  ROS: As per HPI; negative for fever/chills, diaphoresis, fatigue, vision disturbances, CP or palpitations, SOB or DOE, cough, GI problems, myalgias/arthralgias, rashes, HA, dizziness, weakness or syncope. Tremor is barely perceptible. Sleep hygiene is good. No recent agitation, behavior changes or hallucinations.  O: Filed Vitals:   02/07/13 1334  BP: 130/82  Pulse: 78  Temp: 97.5 F (36.4 C)  Resp: 16   GEN: In NAD; WN,WD. HENT: [place]/AT; EOMI w/ clear conj/sclerae. EACs/nose/oroph unremarkable. COR:  RRR. No edema. LUNGS: Normal. resp rate and effort. SKIN: W&D; intact w/o rashes, erythema or pallor. NEURO: A&O x3; CNs intact. Nonfocal. Cognition- good.  A/P: Abnormal CBC -  May 2014 CBC w/ mild anemia and leukopenia.          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  Need for prophylactic vaccination and inoculation against influenza - Plan: Flu Vaccine QUAD 36+ mos IM

## 2013-03-24 ENCOUNTER — Telehealth: Payer: Self-pay | Admitting: Neurology

## 2013-03-24 NOTE — Telephone Encounter (Signed)
Joni Reining from [facility], Dr. Lonie Peak office, called wondering why patient is scheduled so far out in April. Please call.

## 2013-03-25 NOTE — Telephone Encounter (Signed)
I returned Emily Steele's call. Dr. Frances Furbish did not have an opening before April to get patient in earlier. However, we had a cancellation for this Friday. I attempted to contact the patient without any success. I will enter patient in for that appointment at 11:30 a.m. She will have to arrive 15 minutes early. I will attempt to contact patient again and leave a VM. Please, if you would also try to contact patient and let her know, that would be appreciated. Please call with any questions.   I called patient back and was able to get in touch with her. She will take the Friday appointment at 11:30 a.m. And will arrive at 11:15 a.m.

## 2013-03-28 ENCOUNTER — Ambulatory Visit: Payer: Self-pay | Admitting: Neurology

## 2013-03-28 ENCOUNTER — Ambulatory Visit: Payer: BC Managed Care – PPO | Admitting: Family Medicine

## 2013-04-04 ENCOUNTER — Encounter: Payer: Self-pay | Admitting: Family Medicine

## 2013-04-04 ENCOUNTER — Ambulatory Visit (INDEPENDENT_AMBULATORY_CARE_PROVIDER_SITE_OTHER): Payer: BC Managed Care – PPO | Admitting: Family Medicine

## 2013-04-04 VITALS — BP 112/82 | HR 75 | Temp 98.2°F | Resp 16 | Ht 59.0 in | Wt 185.0 lb

## 2013-04-04 DIAGNOSIS — IMO0002 Reserved for concepts with insufficient information to code with codable children: Secondary | ICD-10-CM

## 2013-04-04 DIAGNOSIS — E039 Hypothyroidism, unspecified: Secondary | ICD-10-CM

## 2013-04-04 DIAGNOSIS — F09 Unspecified mental disorder due to known physiological condition: Secondary | ICD-10-CM

## 2013-04-04 MED ORDER — HYDROCODONE-ACETAMINOPHEN 5-300 MG PO TABS: ORAL_TABLET | ORAL | Status: DC

## 2013-04-04 NOTE — Patient Instructions (Signed)
Please follow-up as scheduled with Dr. Murray Hodgkins on Monday.  Contact Dr. Teofilo Pod office after you return from [place] if you have not heard from them before the New Year.  I will let you know if thyroid medication is adequate (and take care of refills after I see the results). I will get the paperwork back to [employer] (SCAT application).  Enjoy The Holidays!  I will see you in 10 weeks or sooner if you think you need to be seen.

## 2013-04-05 LAB — TSH: TSH: 3.101 u[IU]/mL (ref 0.350–4.500)

## 2013-04-06 ENCOUNTER — Encounter: Payer: Self-pay | Admitting: Family Medicine

## 2013-04-07 ENCOUNTER — Encounter: Payer: Self-pay | Admitting: Family Medicine

## 2013-04-07 ENCOUNTER — Telehealth: Payer: Self-pay | Admitting: Neurology

## 2013-04-07 NOTE — Progress Notes (Signed)
S: This 57 y.o. Cauc female  Is here for follow-up regarding cognitive changes after Herpes encephalitis. She has been followed by Dr. Frances Furbish, neurologist at [facility], with last visit in July. Pt has had telephone contact with the practice; she missed an office visit last week. Pt reports she is experiencing increased low back pain and took some pain medication that over-sedated her. She has contacted [facility] to reschedule; 1st available appt is April 2015. This is appropriate as pt has no new neurologic symptoms. She does report some increase in confusion; she finds herself driving and not sure if she has gotten lost or if she is going in the right direction.  Pt does have some increased anxiety when this happens. Note: pt reports she is taking Adderall XR only twice daily, decreased from 3 times a day.  LBP/ lumbar disc disease- as mentioned above, pt is being evaluated by a specialist and will be seeing Dr. Murray Hodgkins for injections. She has been taking some expired hydrocodone-APAP and oxycodone (prescribed in 2009). Pt requests medication until she can see Dr.Bartko on Apr 07, 2013. Pain has increased to the point that she has trouble getting in and out of her car. She has [employer] form that needs to be completed for SCAT.  Hypothyroidism- Pt remains compliant w/ medications. No new symptoms and pt denies fatigue, diaphoresis, palpitations or chest tightness, edema, GI problems, constipation, HA, vision disturbance, weakness, tremor, numbness or syncope.  PROBLEM LIST reviewed. PMHx, Surg Hx, Soc and Fam Hx reviewed. Pt lives alone; her brother, who lives in [place] will be coming to take her to his home for Christmas.  ROS: As per HPI.  O: Filed Vitals:   04/04/13 1503  BP: 112/82  Pulse: 75  Temp: 98.2 F (36.8 C)  Resp: 16    GEN: In NAD; WN,WD. HENT: [place]/AT; EOMI w/ clear conj/sclerae. Otherwise unremarkable. NECK: Supple; no LAN. COR: RRR. LUNGS: Unlabored resp. SKIN: W&D; intact w/o diaphoresis,  erythema or pallor. BACK: Decreased flexibility w/ paravertebral muscles spasms along T-spine and L-spine. NEURO: A&O x 3; CNs intact. Mentation- pt demonstrates slowed cognition w/ word-finding difficulty. Short-term memory (rretention) problems.  A/P: Hypothyroidism - Stable on current dose of Levothyroxine; no change- labs pending.  Plan: TSH, T3, Free, T4, Free  Cognitive dysfunction- Pt has some strategies for lessening confusion and minimizing anxiety when driving. I will complete the SCAT form so that she can use this form of transportation.  Degenerative disc disease- Increasing pain; follow-up w/ Dr. Murray Hodgkins as scheduled.  Meds ordered this encounter  Medications  . Hydrocodone-Acetaminophen 5-300 MG TABS    Sig: Take 1 tablet every 8-12 hours prn severe back pain.    Dispense:  20 each    Refill:  0  I confiscated the expired medications from pt and they were ground up, mixed w/ coffee grounds and discarded in our office.   [employer] form completed an faxed (attn: Sherria High) on 04/08/2013.

## 2013-04-07 NOTE — Telephone Encounter (Signed)
NEEDS SOON APPT--PAIN IN BACK VERY BAD

## 2013-04-27 ENCOUNTER — Encounter (HOSPITAL_COMMUNITY): Payer: Self-pay | Admitting: Emergency Medicine

## 2013-04-27 ENCOUNTER — Emergency Department (HOSPITAL_COMMUNITY)
Admission: EM | Admit: 2013-04-27 | Discharge: 2013-04-27 | Disposition: A | Discharge status: Home / Self Care | Payer: BC Managed Care – PPO | Attending: Emergency Medicine | Admitting: Emergency Medicine

## 2013-04-27 DIAGNOSIS — Z79899 Other long term (current) drug therapy: Secondary | ICD-10-CM | POA: Insufficient documentation

## 2013-04-27 DIAGNOSIS — K219 Gastro-esophageal reflux disease without esophagitis: Secondary | ICD-10-CM | POA: Insufficient documentation

## 2013-04-27 DIAGNOSIS — F329 Major depressive disorder, single episode, unspecified: Secondary | ICD-10-CM | POA: Insufficient documentation

## 2013-04-27 DIAGNOSIS — M722 Plantar fascial fibromatosis: Secondary | ICD-10-CM | POA: Insufficient documentation

## 2013-04-27 DIAGNOSIS — G8929 Other chronic pain: Secondary | ICD-10-CM | POA: Insufficient documentation

## 2013-04-27 DIAGNOSIS — M810 Age-related osteoporosis without current pathological fracture: Secondary | ICD-10-CM | POA: Insufficient documentation

## 2013-04-27 DIAGNOSIS — I10 Essential (primary) hypertension: Secondary | ICD-10-CM | POA: Insufficient documentation

## 2013-04-27 DIAGNOSIS — F411 Generalized anxiety disorder: Secondary | ICD-10-CM | POA: Insufficient documentation

## 2013-04-27 DIAGNOSIS — F3289 Other specified depressive episodes: Secondary | ICD-10-CM | POA: Insufficient documentation

## 2013-04-27 DIAGNOSIS — M545 Low back pain: Secondary | ICD-10-CM

## 2013-04-27 LAB — URINALYSIS, ROUTINE W REFLEX MICROSCOPIC
BILIRUBIN URINE: NEGATIVE
Glucose, UA: NEGATIVE mg/dL
Hgb urine dipstick: NEGATIVE
KETONES UR: NEGATIVE mg/dL
LEUKOCYTES UA: NEGATIVE
NITRITE: NEGATIVE
PH: 6 (ref 5.0–8.0)
PROTEIN: NEGATIVE mg/dL
Specific Gravity, Urine: 1.023 (ref 1.005–1.030)
UROBILINOGEN UA: 0.2 mg/dL (ref 0.0–1.0)

## 2013-04-27 MED ORDER — KETOROLAC TROMETHAMINE 30 MG/ML IJ SOLN
30.0000 mg | Freq: Once | INTRAMUSCULAR | Status: AC
  Administered 2013-04-27: 30 mg via INTRAVENOUS
  Filled 2013-04-27: qty 1

## 2013-04-27 MED ORDER — HYDROMORPHONE HCL PF 2 MG/ML IJ SOLN
2.0000 mg | Freq: Once | INTRAMUSCULAR | Status: AC
  Administered 2013-04-27: 2 mg via INTRAVENOUS

## 2013-04-27 MED ORDER — HYDROMORPHONE HCL PF 1 MG/ML IJ SOLN
1.0000 mg | Freq: Once | INTRAMUSCULAR | Status: AC
  Administered 2013-04-27: 1 mg via INTRAVENOUS
  Filled 2013-04-27: qty 1

## 2013-04-27 MED ORDER — HYDROMORPHONE HCL PF 2 MG/ML IJ SOLN
2.0000 mg | Freq: Once | INTRAMUSCULAR | Status: DC
  Filled 2013-04-27: qty 1

## 2013-04-27 MED ORDER — OXYCODONE-ACETAMINOPHEN 5-325 MG PO TABS: 2.0000 | ORAL_TABLET | ORAL | Status: DC | PRN

## 2013-04-27 MED ORDER — METHYLPREDNISOLONE SODIUM SUCC 125 MG IJ SOLR
125.0000 mg | Freq: Once | INTRAMUSCULAR | Status: AC
  Administered 2013-04-27: 125 mg via INTRAVENOUS
  Filled 2013-04-27: qty 2

## 2013-04-27 NOTE — ED Provider Notes (Signed)
CSN: [id number]     Arrival date & time 04/27/13  1134 History   First MD Initiated Contact with Patient 04/27/13 1139     Chief Complaint  Patient presents with  . Back Pain   (Consider location/radiation/quality/duration/timing/severity/associated sxs/prior Treatment) HPI Comments: Patient is a 58 year old female with history of chronic low back pain due to 2 degenerative disc disease. She has seen neurosurgery for this and they recommended surgery. She declined this. She's been on medication since that time. Her pain became worse several days ago she has been using her TENS unit persistently since that time. She took 2 hydrocodone at home, however has obtained no relief. She denies any bowel or bladder complaints. She denies any radiation into her legs or weakness.  Patient is a 58 y.o. female presenting with back pain. The history is provided by the patient.  Back Pain Location:  Lumbar spine Quality:  Stabbing Radiates to:  Does not radiate Pain severity:  Severe Pain is:  Same all the time Onset quality:  Gradual Duration:  8 months Timing:  Constant Progression:  Worsening Chronicity:  New Relieved by:  Nothing Worsened by:  Nothing tried Ineffective treatments:  Narcotics   Past Medical History  Diagnosis Date  . Depression   . Fibromyalgia   . Hypothyroid   . Sleep behavior disorder, REM   . Osteopenia   . Obesity   . Plantar fasciitis   . Arthritis   . GERD (gastroesophageal reflux disease)   . Anemia   . Hypertension   . Osteoporosis   . Rosacea   . Eczema   . Fibromyalgia   . UTI (urinary tract infection)   . Hx of migraine headaches   . Constipation   . Anxiety   . Allergy    Past Surgical History  Procedure Laterality Date  . Tonsillectomy and adenoidectomy    . Abdominal hysterectomy    . Spine surgery     Family History  Problem Relation Age of Onset  . Cancer Mother   . Hypertension Mother   . Cancer Sister   . Heart disease Sister   .  Asthma Sister   . Asthma Brother   . Drug abuse Brother   . Hypertension Brother    History  Substance Use Topics  . Smoking status: Never Smoker   . Smokeless tobacco: Not on file  . Alcohol Use: No   OB History   Grav Para Term Preterm Abortions TAB SAB Ect Mult Living                 Review of Systems  Musculoskeletal: Positive for back pain.  All other systems reviewed and are negative.    Allergies  Doxycycline; Sulfa antibiotics; and Methocarbamol  Home Medications   Current Outpatient Rx  Name  Route  Sig  Dispense  Refill  . amphetamine-dextroamphetamine (ADDERALL XR) 30 MG 24 hr capsule   Oral   Take 30 mg by mouth 2 (two) times daily before a meal.          . buPROPion (WELLBUTRIN SR) 150 MG 12 hr tablet   Oral   Take 150 mg by mouth 2 (two) times daily.         . clindamycin (CLEOCIN T) 1 % external solution   Topical   Apply topically 2 (two) times daily.         . DULoxetine (CYMBALTA) 60 MG capsule   Oral   Take 60 mg by mouth 2 (  two) times daily.         Marland Kitchen gabapentin (NEURONTIN) 100 MG capsule   Oral   Take 100 mg by mouth 3 (three) times daily.         . Hydrocodone-Acetaminophen 5-300 MG TABS      Take 1 tablet every 8-12 hours prn severe back pain.   20 each   0   . levothyroxine (SYNTHROID, LEVOTHROID) 50 MCG tablet   Oral   Take 1 tablet (50 mcg total) by mouth daily.   90 tablet   1   . lidocaine (LIDODERM) 5 %   Transdermal   Place 1 patch onto the skin as needed. Remove & Discard patch within 12 hours or as directed by MD         . metaxalone (SKELAXIN) 800 MG tablet   Oral   Take 800 mg by mouth 3 (three) times daily.         . temazepam (RESTORIL) 15 MG capsule   Oral   Take 15 mg by mouth daily.          BP 137/85  Pulse 71  Temp(Src) 98.7 F (37.1 C) (Oral)  Resp 20  SpO2 100% Physical Exam  Nursing note and vitals reviewed. Constitutional: She is oriented to person, place, and time. She  appears well-developed and well-nourished. No distress.  HENT:  Head: Normocephalic and atraumatic.  Neck: Normal range of motion. Neck supple.  Cardiovascular: Normal rate, regular rhythm and normal heart sounds.   Pulmonary/Chest: Effort normal. No respiratory distress. She has no wheezes.  Abdominal: Soft. Bowel sounds are normal. She exhibits no distension. There is no tenderness.  Musculoskeletal: Normal range of motion. She exhibits no edema.  Neurological: She is alert and oriented to person, place, and time.  Strength is 5 out of 5 in the bilateral lower extremities. Deep tendon reflexes are 1+ and equal in the bilateral lower extremities.  Skin: Skin is warm and dry. She is not diaphoretic.    ED Course  Procedures (including critical care time) Labs Review Labs Reviewed  URINALYSIS, ROUTINE W REFLEX MICROSCOPIC   Imaging Review No results found.  EKG Interpretation   None       MDM  No diagnosis found. Patient is a 58 year old female with history of chronic back pain. She presents today with exacerbation of the same. Her neurologic exam and history raise no red flags that would be consistent with an emergent situation. She is feeling better with IV Dilaudid and her urinalysis is negative. I feel as though she is stable for discharge. I've advised her to followup with her neurosurgeon to discuss further intervention. She had requested an MRI, however she had one done one month ago and I see no emergent indication for this. I've advised her to discuss whether or not this is appropriate with her neurosurgeon.    Veryl Speak, MD 04/27/13 1430

## 2013-04-27 NOTE — ED Notes (Signed)
Bed: JF35 Expected date: 04/27/13 Expected time: 11:27 AM Means of arrival: Ambulance Comments: Back Pain (Chronic)

## 2013-04-27 NOTE — Discharge Instructions (Signed)
Percocet as prescribed as needed for pain.  Call your neurosurgeon in the morning to arrange a followup appointment.   Back Pain, Adult Low back pain is very common. About 1 in 5 people have back pain.The cause of low back pain is rarely dangerous. The pain often gets better over time.About half of people with a sudden onset of back pain feel better in just 2 weeks. About 8 in 10 people feel better by 6 weeks.  CAUSES Some common causes of back pain include:  Strain of the muscles or ligaments supporting the spine.  Wear and tear (degeneration) of the spinal discs.  Arthritis.  Direct injury to the back. DIAGNOSIS Most of the time, the direct cause of low back pain is not known.However, back pain can be treated effectively even when the exact cause of the pain is unknown.Answering your caregiver's questions about your overall health and symptoms is one of the most accurate ways to make sure the cause of your pain is not dangerous. If your caregiver needs more information, he or she may order lab work or imaging tests (X-rays or MRIs).However, even if imaging tests show changes in your back, this usually does not require surgery. HOME CARE INSTRUCTIONS For many people, back pain returns.Since low back pain is rarely dangerous, it is often a condition that people can learn to [facility] their own.   Remain active. It is stressful on the back to sit or stand in one place. Do not sit, drive, or stand in one place for more than 30 minutes at a time. Take short walks on level surfaces as soon as pain allows.Try to increase the length of time you walk each day.  Do not stay in bed.Resting more than 1 or 2 days can delay your recovery.  Do not avoid exercise or work.Your body is made to move.It is not dangerous to be active, even though your back may hurt.Your back will likely heal faster if you return to being active before your pain is gone.  Pay attention to your body when you bend  and lift. Many people have less discomfortwhen lifting if they bend their knees, keep the load close to their bodies,and avoid twisting. Often, the most comfortable positions are those that put less stress on your recovering back.  Find a comfortable position to sleep. Use a firm mattress and lie on your side with your knees slightly bent. If you lie on your back, put a pillow under your knees.  Only take over-the-counter or prescription medicines as directed by your caregiver. Over-the-counter medicines to reduce pain and inflammation are often the most helpful.Your caregiver may prescribe muscle relaxant drugs.These medicines help dull your pain so you can more quickly return to your normal activities and healthy exercise.  Put ice on the injured area.  Put ice in a plastic bag.  Place a towel between your skin and the bag.  Leave the ice on for 15-20 minutes, 03-04 times a day for the first 2 to 3 days. After that, ice and heat may be alternated to reduce pain and spasms.  Ask your caregiver about trying back exercises and gentle massage. This may be of some benefit.  Avoid feeling anxious or stressed.Stress increases muscle tension and can worsen back pain.It is important to recognize when you are anxious or stressed and learn ways to manage it.Exercise is a great option. SEEK MEDICAL CARE IF:  You have pain that is not relieved with rest or medicine.  You have  pain that does not improve in 1 week.  You have new symptoms.  You are generally not feeling well. SEEK IMMEDIATE MEDICAL CARE IF:   You have pain that radiates from your back into your legs.  You develop new bowel or bladder control problems.  You have unusual weakness or numbness in your arms or legs.  You develop nausea or vomiting.  You develop abdominal pain.  You feel faint. Document Released: 04/03/2005 Document Revised: 10/03/2011 Document Reviewed: 08/22/2010 [employer] Patient Information 2014  [place], [place].

## 2013-04-27 NOTE — ED Notes (Signed)
Pt in via EMS with chronic back pain. Pt has TENS unit, 2 Norco PTA, still has severe pain. Pt has hx of degenerative disc disease. Pt is A&O and in NAD

## 2013-04-27 NOTE — ED Notes (Signed)
Pt from home, c/o chronic back pain. Pt states that she has hx of chronic back pain. Pt is currently wearing a TENS unit. Pt took 2 Norco PTA. Pt saw neuro 2 days ago, was told that she is a surgical candidate for fractured/herniated disc. Pt is tearful, but A&O.

## 2013-04-30 ENCOUNTER — Ambulatory Visit (INDEPENDENT_AMBULATORY_CARE_PROVIDER_SITE_OTHER): Payer: BC Managed Care – PPO | Admitting: Neurology

## 2013-04-30 ENCOUNTER — Encounter: Payer: Self-pay | Admitting: Neurology

## 2013-04-30 ENCOUNTER — Telehealth: Payer: Self-pay

## 2013-04-30 VITALS — BP 124/80 | HR 77 | Temp 97.7°F | Ht 60.0 in | Wt 188.5 lb

## 2013-04-30 DIAGNOSIS — D320 Benign neoplasm of cerebral meninges: Secondary | ICD-10-CM

## 2013-04-30 DIAGNOSIS — F319 Bipolar disorder, unspecified: Secondary | ICD-10-CM

## 2013-04-30 DIAGNOSIS — A89 Unspecified viral infection of central nervous system: Secondary | ICD-10-CM

## 2013-04-30 DIAGNOSIS — D329 Benign neoplasm of meninges, unspecified: Secondary | ICD-10-CM

## 2013-04-30 DIAGNOSIS — R413 Other amnesia: Secondary | ICD-10-CM

## 2013-04-30 DIAGNOSIS — A86 Unspecified viral encephalitis: Secondary | ICD-10-CM

## 2013-04-30 MED ORDER — LEVOTHYROXINE SODIUM 50 MCG PO TABS: 50.0000 ug | ORAL_TABLET | Freq: Every day | ORAL | Status: DC

## 2013-04-30 NOTE — Telephone Encounter (Signed)
Pharm req for levothyrox RF. Sent in.

## 2013-04-30 NOTE — Progress Notes (Signed)
Subjective:    Patient ID: Emily Steele is a 58 y.o. female.  HPI  Interim history:   Emily Steele is a 58 year old right-handed woman with an underlying medical history of bipolar disorder and schizoaffective disorder, and degenerative disc disease, who presents for followup consultation of her history of herpes encephalitis, associated with memory loss and balance issues and incoordination. She is unaccompanied today. I first met her on 10/24/2012 at which time I did not suggest any new test or new medication. She was supposed to start physical therapy as she had fallen a few times in the past and was scheduled for a repeat brain MRI. She was found to have a meningioma incidentally on her prior brain MRI while in the hospital, for which she has seen neurosurgeon Dr. Saintclair Steele as an outpatient. Of note, she canceled an appointment with me in October 2014 and no-showed for an appointment with me, that was made for her sooner than scheduled in December 2014.   Today, she reports having had a brain MRI recently under Dr. Saintclair Steele and she has seen him for her back pain and may need surgery. She is on Lidoderm and on hydrocodone and has a TENS machine. She is takine her hydrocodone every 4 hours, instead of every 6 hours. It makes her sleepy. She did not go to PT and feels better with her coordination and her memory is stable, better from 5/14. She misplaces things, is forgetful and loses track of dates. She does not drive longer distances. She had no recent changes in her medications, but her psychiatrist is in the process of reducing her Adderall and her Restoril. She sees Dr. Rennis Steele for her FMS and is on gabapentin and on Skelaxin. She had a sleep study in 2008 or so and denies snoring or gasping for air in her sleep. Her depression is still bothersome and she sees Dr. Toy Steele. She had recent fleeting SI, but no plans.   She was hospitalized for herpes encephalitis associated with altered mental status at [facility] from 08/17/2012 through 08/22/2012.  Her last brain MRI w and w/o contrast from 08/21/12 showed: Diffuse encephalomalacia of the right temporal lobe. The HSV PCR was negative. This may represent a remote injury due to herpes simplex. Diffuse white matter signal in the right temporal lobe may be chronic. 3. Abnormal signal along the insular cortex bilaterally is compatible with encephalitis. Active disease remains a concern. 4. No evidence for acute infarct or hemorrhage. 5. The solid enhancing lesion is more clearly extra-axial and compatible with a meningioma. 6. Diffuse sinus disease.  An EEG from 08/20/12 showed: mild nonspecific generalized continuous slowing of cerebral activity. There was no disproportionate focal slowing involving the right temporal region. No evidence of an epileptic disorder was demonstrated.  She was treated for a UTI as well as CAP with ABx and as the HSV PCR came back negative, acyclovir was discontinued. She was admitted the first week of May. There were changes in her personality dating back to February, when she stopped all her meds, including her psych meds.  Her other brother, Emily Steele and her sister, Emily Steele have health Steele POA.  She had L-spine surgery in 2004 under Dr. Saintclair Steele, which was very successful, per her report, but she has had recurrence of sciatica.  She was seen by NSG in the hospital and was told there was no need for surgery for her menigioma.   Her Past Medical History Is Significant For: Past Medical History  Diagnosis Date  . Depression   . Fibromyalgia   . Hypothyroid   . Sleep behavior disorder, REM   . Osteopenia   . Obesity   . Plantar fasciitis   . Arthritis   . GERD (gastroesophageal reflux disease)   . Anemia   . Hypertension   . Osteoporosis   . Rosacea   . Eczema   . Fibromyalgia   . UTI (urinary tract infection)   . Hx of migraine headaches   . Constipation   . Anxiety   . Allergy     Her Past Surgical History Is  Significant For: Past Surgical History  Procedure Laterality Date  . Tonsillectomy and adenoidectomy    . Abdominal hysterectomy    . Spine surgery      Her Family History Is Significant For: Family History  Problem Relation Age of Onset  . Cancer Mother   . Hypertension Mother   . Cancer Sister   . Heart disease Sister   . Asthma Sister   . Asthma Brother   . Drug abuse Brother   . Hypertension Brother     Her Social History Is Significant For: History   Social History  . Marital Status: Single    Spouse Name: N/A    Number of Children: 0  . Years of Education: college   Occupational History  .  Other    n/a   Social History Main Topics  . Smoking status: Never Smoker   . Smokeless tobacco: Never Used  . Alcohol Use: No  . Drug Use: No  . Sexual Activity: No   Other Topics Concern  . None   Social History Narrative   Patient lives at home alone.   Caffeine Use: 3-4 cups weekly   Single. Education: [employer].  Exercises 4x's a week.    Her Allergies Are:  Allergies  Allergen Reactions  . Doxycycline Other (See Comments)    headaches  . Sulfa Antibiotics     Headaches  . Methocarbamol Nausea Only    headaches  :   Her Current Medications Are:  Outpatient Encounter Prescriptions as of 04/30/2013  Medication Sig  . amphetamine-dextroamphetamine (ADDERALL XR) 30 MG 24 hr capsule Take 30 mg by mouth 2 (two) times daily before a meal.   . buPROPion (WELLBUTRIN SR) 150 MG 12 hr tablet Take 150 mg by mouth 2 (two) times daily.  . clindamycin (CLEOCIN T) 1 % external solution Apply topically 2 (two) times daily.  . DULoxetine (CYMBALTA) 60 MG capsule Take 60 mg by mouth 2 (two) times daily.  Marland Kitchen gabapentin (NEURONTIN) 300 MG capsule Take 300 mg by mouth 3 (three) times daily.  Marland Kitchen levothyroxine (SYNTHROID, LEVOTHROID) 50 MCG tablet Take 1 tablet (50 mcg total) by mouth daily.  Marland Kitchen lidocaine (LIDODERM) 5 % Place 1 patch onto the skin as needed. Remove & Discard  patch within 12 hours or as directed by MD  . metaxalone (SKELAXIN) 800 MG tablet Take 800 mg by mouth 3 (three) times daily.  Marland Kitchen oxyCODONE-acetaminophen (PERCOCET) 5-325 MG per tablet Take 2 tablets by mouth every 4 (four) hours as needed.  . temazepam (RESTORIL) 15 MG capsule Take 15 mg by mouth daily. 2 tablets one day, next day 3 tabs, then 2 again  . [DISCONTINUED] Hydrocodone-Acetaminophen 5-300 MG TABS Take 1 tablet every 8-12 hours prn severe back pain.   Review of Systems:   Out of a complete 14 point review of systems, all are reviewed and negative with  the exception of these symptoms as listed below:  Review of Systems  Constitutional: Negative.   HENT: Negative.   Eyes: Negative.   Respiratory: Negative.   Cardiovascular: Negative.   Gastrointestinal: Negative.   Endocrine: Negative.   Genitourinary: Negative.   Musculoskeletal: Negative.   Skin: Negative.   Allergic/Immunologic: Negative.   Neurological: Negative.   Hematological: Negative.   Psychiatric/Behavioral: Negative.   All other systems reviewed and are negative.    Objective:  Neurologic Exam  Physical Exam Physical Examination:   Filed Vitals:   04/30/13 1202  BP: 124/80  Steele: 77  Temp: 97.7 F (36.5 C)   General Examination: The patient is a very pleasant 58 y.o. female in no acute distress. She appears well-developed and well-nourished and adequately groomed. She is obese.   HEENT: Normocephalic, atraumatic, pupils are equal, round and reactive to light and accommodation. Funduscopic exam is normal with sharp disc margins noted. Extraocular tracking is good without limitation to gaze excursion or nystagmus noted. Normal smooth pursuit is noted. Hearing is grossly intact. Face is symmetric with normal facial animation and normal facial sensation. Speech is clear with no dysarthria noted. There is no hypophonia. There is no lip, neck/head, jaw or voice tremor. Neck is supple with full range of  passive and active motion. There are no carotid bruits on auscultation. Oropharynx exam reveals: mild mouth dryness, adequate dental hygiene and mild airway crowding, due to narrow airway. Mallampati is class II. Tongue protrudes centrally and palate elevates symmetrically.    Chest: Clear to auscultation without wheezing, rhonchi or crackles noted.  Heart: S1+S2+0, regular and normal without murmurs, rubs or gallops noted.   Abdomen: Soft, non-tender and non-distended with normal bowel sounds appreciated on auscultation.  Extremities: There is no pitting edema in the distal lower extremities bilaterally. Pedal pulses are intact.  Skin: Warm and dry without trophic changes noted. There are no varicose veins.  Musculoskeletal: exam reveals no obvious joint deformities, tenderness or joint swelling or erythema.   Neurologically:  Mental status: The patient is awake, alert and oriented in all 4 spheres. Her memory, attention, language and knowledge are appropriate and there is improvement in her psychomotor retardation from last time. There is no aphasia, agnosia, apraxia or anomia. Speech is clear with normal prosody and enunciation. Thought process is linear. Mood is mildly depressed and affect is blunted. Of note, she denies current SI/HI, but she had SI in the recent. Cranial nerves are as described above under HEENT exam. In addition, shoulder shrug is normal with equal shoulder height noted. Motor exam: Normal bulk, strength and tone is noted. There is no drift, tremor or rebound. Romberg is negative. Reflexes are 2+ throughout. Fine motor skills are intact with normal finger taps, normal hand movements, normal rapid alternating patting, normal foot taps and normal foot agility, with the exception of mild slowness.  Cerebellar testing shows no dysmetria or intention tremor on finger to nose testing. There is no truncal or gait ataxia.  Sensory exam is intact to light touch in the upper and lower  extremities.  Gait, station and balance: she stands up slowly and c/o LBP radiating to the L. She walks cautiously, but better than last time. She has a mild L limp. No veering to one side is noted. No leaning to one side is noted. Posture is age-appropriate and stance is mildly wide based. No problems turning are noted. She turns en bloc. Tandem walk is not possible for her.  Assessment and Plan:   In summary, Emily Steele is a very pleasant 58 year old female with a history of encephalitis, likely viral, with incidental finding of a brain meningioma. She has overall improved with regards to her cognitive function, but has residual forgetfulness. She also has FMS. Her exam is fairly stable, but she endorses residual depression and flare up in her back pain, for which she sees Dr. Saintclair Steele. Today, I suggested we pursue formal memory testing with a neuropsychologist. She was in agreement. She follows with Dr. Toy Steele in psychiatry and with Dr. Rennis Steele in rheumatology. She drives some, but not longer distances.  I again had a long chat with the patient regarding the diagnosis of encephalitis, cognitive dysfunction and her mood disorder and her meningioma, for which she does not need surgical intervention.  As far as further diagnostic testing is concerned, I suggested referral for neuropsychological testing.   As far as medications are concerned, I recommended the following at this time: no new medications and we mutually decided to hold off on medication for dementia, in particular, since she has improved and is still in the process of adjusting her pain medication regimen and her mood medications. Also, I encouraged her to be patient with herself. She has multiple issues ongoing that can affect her cognitive function. Medication effects and side effects are included in that and also she's not quite a year out from her encephalitis. It is reassuring that she has improved in her balance, coordination,  gait and cognitive function compared to May of last year.  I answered all her questions today and would like to see the patient back in 4 months, sooner if the need arises and encouraged her to call with any interim questions, concerns, problems or updates.

## 2013-04-30 NOTE — Patient Instructions (Addendum)
I think overall you are doing fairly well and are fairly stable at this point.   I do have some generic suggestions for you today:  Please make sure that you drink plenty of fluids. I would like for you to exercise daily for example in the form of walking 20-30 minutes every day, if you can. Please keep a regular sleep-wake schedule, keep regular meal times, do not skip any meals, eat  healthy snacks in between meals, such as fruit or nuts. Try to eat protein with every meal.   As far as your medications are concerned, I would like to suggest: no new meds.    As far as diagnostic testing, I recommend: referral to neuropsychology.   Engage in social activities in your community and with your family and try to keep up with current events by reading the newspaper or watching the news.  I do not think we need to make any changes in your medications at this point. I think you're stable enough that I can see you back in 4 months, sooner if we need to. Please call us if you have any interim questions, concerns, or problems or updates to need to discuss.  Our nursing staff will answer any of your questions and relay your messages to me and will give you my messages.   Our phone number is [phone]. We also have an after hours call service for urgent matters and there is a physician on-call for urgent questions. For any emergencies you know to call 911 or go to the nearest emergency room.

## 2013-05-01 ENCOUNTER — Telehealth: Payer: Self-pay | Admitting: Neurology

## 2013-05-01 NOTE — Telephone Encounter (Signed)
Patient called regarding her MRI results - after talking to her surgeon, he said the MRI results should be in the system for Dr. Rexene Alberts to access. Please call the patient with any questions or concerns.

## 2013-05-01 NOTE — Telephone Encounter (Signed)
Called patient and inform that MRI (11/14) is not on epic, will Call Dr Windy Carina office to have it faxed after patient's signed release

## 2013-05-13 ENCOUNTER — Other Ambulatory Visit: Payer: Self-pay | Admitting: Neurosurgery

## 2013-05-13 DIAGNOSIS — M545 Low back pain, unspecified: Secondary | ICD-10-CM

## 2013-05-15 ENCOUNTER — Ambulatory Visit
Admission: RE | Admit: 2013-05-15 | Discharge: 2013-05-15 | Disposition: A | Discharge status: Home / Self Care | Payer: BC Managed Care – PPO | Source: Ambulatory Visit | Attending: Neurosurgery | Admitting: Neurosurgery

## 2013-05-15 DIAGNOSIS — M545 Low back pain, unspecified: Secondary | ICD-10-CM

## 2013-05-15 MED ORDER — METHYLPREDNISOLONE ACETATE 40 MG/ML INJ SUSP (RADIOLOG
120.0000 mg | Freq: Once | INTRAMUSCULAR | Status: AC
Start: 2013-05-15 — End: 2013-05-15
  Administered 2013-05-15: 120 mg via INTRA_ARTICULAR

## 2013-05-15 MED ORDER — IOHEXOL 180 MG/ML  SOLN
1.0000 mL | Freq: Once | INTRAMUSCULAR | Status: AC | PRN
  Administered 2013-05-15: 1 mL via INTRA_ARTICULAR

## 2013-05-15 NOTE — Discharge Instructions (Signed)

## 2013-05-16 ENCOUNTER — Encounter: Payer: Self-pay | Admitting: Neurology

## 2013-06-09 ENCOUNTER — Other Ambulatory Visit: Payer: Self-pay | Admitting: Neurosurgery

## 2013-06-09 DIAGNOSIS — Q762 Congenital spondylolisthesis: Secondary | ICD-10-CM

## 2013-06-12 ENCOUNTER — Ambulatory Visit: Payer: BC Managed Care – PPO | Admitting: Family Medicine

## 2013-06-13 ENCOUNTER — Ambulatory Visit: Payer: BC Managed Care – PPO | Admitting: Family Medicine

## 2013-06-16 ENCOUNTER — Other Ambulatory Visit: Payer: BC Managed Care – PPO

## 2013-06-19 ENCOUNTER — Ambulatory Visit: Payer: BC Managed Care – PPO | Admitting: Family Medicine

## 2013-07-10 ENCOUNTER — Ambulatory Visit: Payer: BC Managed Care – PPO | Admitting: Family Medicine

## 2013-07-15 ENCOUNTER — Other Ambulatory Visit: Payer: Self-pay | Admitting: Neurosurgery

## 2013-07-18 ENCOUNTER — Other Ambulatory Visit: Payer: BC Managed Care – PPO

## 2013-07-21 ENCOUNTER — Other Ambulatory Visit: Payer: BC Managed Care – PPO

## 2013-07-24 ENCOUNTER — Other Ambulatory Visit: Payer: BC Managed Care – PPO

## 2013-07-25 ENCOUNTER — Ambulatory Visit: Payer: BC Managed Care – PPO | Admitting: Family Medicine

## 2013-07-29 ENCOUNTER — Other Ambulatory Visit: Payer: BC Managed Care – PPO

## 2013-07-29 ENCOUNTER — Ambulatory Visit: Payer: BC Managed Care – PPO | Admitting: Neurology

## 2013-08-05 ENCOUNTER — Encounter (HOSPITAL_COMMUNITY): Payer: Self-pay | Admitting: Pharmacy Technician

## 2013-08-07 ENCOUNTER — Inpatient Hospital Stay: Admission: RE | Admit: 2013-08-07 | Payer: BC Managed Care – PPO | Source: Ambulatory Visit

## 2013-08-08 ENCOUNTER — Inpatient Hospital Stay (HOSPITAL_COMMUNITY)
Admission: RE | Admit: 2013-08-08 | Discharge: 2013-08-08 | Disposition: A | Discharge status: Home / Self Care | Payer: BC Managed Care – PPO | Source: Ambulatory Visit

## 2013-08-12 ENCOUNTER — Ambulatory Visit
Admission: RE | Admit: 2013-08-12 | Discharge: 2013-08-12 | Disposition: A | Discharge status: Home / Self Care | Payer: BC Managed Care – PPO | Source: Ambulatory Visit | Attending: Neurosurgery | Admitting: Neurosurgery

## 2013-08-12 ENCOUNTER — Ambulatory Visit (HOSPITAL_COMMUNITY)
Admission: RE | Admit: 2013-08-12 | Discharge: 2013-08-12 | Disposition: A | Discharge status: Home / Self Care | Payer: BC Managed Care – PPO | Source: Ambulatory Visit | Attending: Anesthesiology | Admitting: Anesthesiology

## 2013-08-12 ENCOUNTER — Encounter (HOSPITAL_COMMUNITY): Payer: Self-pay

## 2013-08-12 ENCOUNTER — Encounter (HOSPITAL_COMMUNITY)
Admission: RE | Admit: 2013-08-12 | Discharge: 2013-08-12 | Disposition: A | Discharge status: Home / Self Care | Payer: BC Managed Care – PPO | Source: Ambulatory Visit | Attending: Neurosurgery | Admitting: Neurosurgery

## 2013-08-12 DIAGNOSIS — Z01818 Encounter for other preprocedural examination: Secondary | ICD-10-CM | POA: Insufficient documentation

## 2013-08-12 DIAGNOSIS — Z01810 Encounter for preprocedural cardiovascular examination: Secondary | ICD-10-CM | POA: Insufficient documentation

## 2013-08-12 DIAGNOSIS — Q762 Congenital spondylolisthesis: Secondary | ICD-10-CM

## 2013-08-12 HISTORY — DX: Pneumonia, unspecified organism: J18.9

## 2013-08-12 HISTORY — DX: Unspecified hearing loss, unspecified ear: H91.90

## 2013-08-12 HISTORY — DX: Unspecified osteoarthritis, unspecified site: M19.90

## 2013-08-12 HISTORY — DX: Tinea unguium: B35.1

## 2013-08-12 HISTORY — DX: Herpesviral infection, unspecified: B00.9

## 2013-08-12 HISTORY — DX: Neoplasm of unspecified behavior of brain: D49.6

## 2013-08-12 HISTORY — DX: Reserved for concepts with insufficient information to code with codable children: IMO0002

## 2013-08-12 LAB — TYPE AND SCREEN
ABO/RH(D): A NEG
Antibody Screen: NEGATIVE

## 2013-08-12 LAB — BASIC METABOLIC PANEL
BUN: 21 mg/dL (ref 6–23)
CO2: 24 mEq/L (ref 19–32)
CREATININE: 0.7 mg/dL (ref 0.50–1.10)
Calcium: 9.5 mg/dL (ref 8.4–10.5)
Chloride: 103 mEq/L (ref 96–112)
GFR calc non Af Amer: >90 mL/min (ref >90)
Glucose, Bld: 95 mg/dL (ref 70–99)
Potassium: 4.2 mEq/L (ref 3.7–5.3)
Sodium: 141 mEq/L (ref 137–147)

## 2013-08-12 LAB — CBC
HEMATOCRIT: 41.4 % (ref 36.0–46.0)
Hemoglobin: 13.8 g/dL (ref 12.0–15.0)
MCH: 28.2 pg (ref 26.0–34.0)
MCHC: 33.3 g/dL (ref 30.0–36.0)
MCV: 84.7 fL (ref 78.0–100.0)
PLATELETS: 210 10*3/uL (ref 150–400)
RBC: 4.89 MIL/uL (ref 3.87–5.11)
RDW: 14.5 % (ref 11.5–15.5)
WBC: 6.4 10*3/uL (ref 4.0–10.5)

## 2013-08-12 LAB — SURGICAL PCR SCREEN
MRSA, PCR: NEGATIVE
STAPHYLOCOCCUS AUREUS: POSITIVE — AB

## 2013-08-12 LAB — ABO/RH: ABO/RH(D): A NEG

## 2013-08-12 NOTE — Progress Notes (Signed)
I phoned in a prescription for Mupirocin ointment to [facility], [place].

## 2013-08-12 NOTE — Pre-Procedure Instructions (Signed)
Emily Steele  08/12/2013   Your procedure is scheduled on:  08/18/2013  Report to [facility] Admitting  ENTRANCE A & proceed to the admitting office  at 5:30 AM.  Call this number if you have problems the morning of surgery: [phone]   Remember:   Do not eat food or drink liquids after midnight. On Sunday    Take these medicines the morning of surgery with A SIP OF WATER: Wellbutrin, Cymbalta, Gabapentin, Synthroid, Skelaxin, pain medicine, Zantac if needed    Do not wear jewelry, make-up or nail polish.  Do not wear lotions, powders, or perfumes.               Do not shave 48 hours prior to surgery.  Do not bring valuables to the hospital.             [facility] is not responsible for any belongings or valuables.               Contacts, dentures or bridgework may not be worn into surgery.  Leave suitcase in the car. After surgery it may be brought to your room.  For patients admitted to the hospital, discharge time is determined by your treatment team.               Special Instructions: Special Instructions: [facility] - Preparing for Surgery  Before surgery, you can play an important role.  Because skin is not sterile, your skin needs to be as free of germs as possible.  You can reduce the number of germs on you skin by washing with CHG (chlorahexidine gluconate) soap before surgery.  CHG is an antiseptic cleaner which kills germs and bonds with the skin to continue killing germs even after washing.  Please DO NOT use if you have an allergy to CHG or antibacterial soaps.  If your skin becomes reddened/irritated stop using the CHG and inform your nurse when you arrive at Short Stay.  Do not shave (including legs and underarms) for at least 48 hours prior to the first CHG shower.  You may shave your face.  Please follow these instructions carefully:   1.  Shower with CHG Soap the night before surgery and the  morning of Surgery.  2.  If you choose to wash your  hair, wash your hair first as usual with your  normal shampoo.  3.  After you shampoo, rinse your hair and body thoroughly to remove the  Shampoo.  4.  Use CHG as you would any other liquid soap.  You can apply chg directly to the skin and wash gently with scrungie or a clean washcloth.  5.  Apply the CHG Soap to your body ONLY FROM THE NECK DOWN.    Do not use on open wounds or open sores.  Avoid contact with your eyes, ears, mouth and genitals (private parts).  Wash genitals (private parts)   with your normal soap.  6.  Wash thoroughly, paying special attention to the area where your surgery will be performed.  7.  Thoroughly rinse your body with warm water from the neck down.  8.  DO NOT shower/wash with your normal soap after using and rinsing off   the CHG Soap.  9.  Pat yourself dry with a clean towel.            10.  Wear clean pajamas.            11 .  Place clean  sheets on your bed the night of your first shower and do not sleep with pets.  Day of Surgery  Do not apply any lotions/deodorants the morning of surgery.  Please wear clean clothes to the hospital/surgery center.   Please read over the following fact sheets that you were given: Pain Booklet, Coughing and Deep Breathing, Blood Transfusion Information, MRSA Information and Surgical Site Infection Prevention

## 2013-08-17 MED ORDER — DEXAMETHASONE SODIUM PHOSPHATE 10 MG/ML IJ SOLN
10.0000 mg | INTRAMUSCULAR | Status: DC
  Filled 2013-08-17: qty 1

## 2013-08-17 MED ORDER — CEFAZOLIN SODIUM-DEXTROSE 2-3 GM-% IV SOLR
2.0000 g | INTRAVENOUS | Status: DC
Start: 2013-08-17 — End: 2013-08-18
  Filled 2013-08-17: qty 50

## 2013-08-18 ENCOUNTER — Inpatient Hospital Stay (HOSPITAL_COMMUNITY)
Admission: RE | Admit: 2013-08-18 | Discharge: 2013-08-20 | DRG: 460 | Disposition: A | Discharge status: Home / Self Care | Payer: BC Managed Care – PPO | Source: Ambulatory Visit | Attending: Neurosurgery | Admitting: Neurosurgery

## 2013-08-18 ENCOUNTER — Inpatient Hospital Stay (HOSPITAL_COMMUNITY): Payer: BC Managed Care – PPO | Admitting: Anesthesiology

## 2013-08-18 ENCOUNTER — Encounter (HOSPITAL_COMMUNITY): Payer: Self-pay | Admitting: Anesthesiology

## 2013-08-18 ENCOUNTER — Inpatient Hospital Stay (HOSPITAL_COMMUNITY): Payer: BC Managed Care – PPO

## 2013-08-18 ENCOUNTER — Encounter (HOSPITAL_COMMUNITY)
Admission: RE | Disposition: A | Discharge status: Home / Self Care | Payer: BC Managed Care – PPO | Source: Ambulatory Visit | Attending: Neurosurgery

## 2013-08-18 ENCOUNTER — Encounter (HOSPITAL_COMMUNITY): Payer: BC Managed Care – PPO | Admitting: Anesthesiology

## 2013-08-18 DIAGNOSIS — IMO0001 Reserved for inherently not codable concepts without codable children: Secondary | ICD-10-CM | POA: Diagnosis present

## 2013-08-18 DIAGNOSIS — I10 Essential (primary) hypertension: Secondary | ICD-10-CM | POA: Diagnosis present

## 2013-08-18 DIAGNOSIS — M5137 Other intervertebral disc degeneration, lumbosacral region: Secondary | ICD-10-CM | POA: Diagnosis present

## 2013-08-18 DIAGNOSIS — Z79899 Other long term (current) drug therapy: Secondary | ICD-10-CM | POA: Diagnosis not present

## 2013-08-18 DIAGNOSIS — K219 Gastro-esophageal reflux disease without esophagitis: Secondary | ICD-10-CM | POA: Diagnosis present

## 2013-08-18 DIAGNOSIS — M4310 Spondylolisthesis, site unspecified: Secondary | ICD-10-CM | POA: Diagnosis present

## 2013-08-18 DIAGNOSIS — E039 Hypothyroidism, unspecified: Secondary | ICD-10-CM | POA: Diagnosis present

## 2013-08-18 DIAGNOSIS — M79609 Pain in unspecified limb: Secondary | ICD-10-CM | POA: Diagnosis present

## 2013-08-18 DIAGNOSIS — M4316 Spondylolisthesis, lumbar region: Secondary | ICD-10-CM

## 2013-08-18 DIAGNOSIS — M51379 Other intervertebral disc degeneration, lumbosacral region without mention of lumbar back pain or lower extremity pain: Secondary | ICD-10-CM | POA: Diagnosis present

## 2013-08-18 HISTORY — PX: LAMINECTOMY: SHX219

## 2013-08-18 HISTORY — PX: MAXIMUM ACCESS (MAS)POSTERIOR LUMBAR INTERBODY FUSION (PLIF) 1 LEVEL: SHX6368

## 2013-08-18 SURGERY — FOR MAXIMUM ACCESS (MAS) POSTERIOR LUMBAR INTERBODY FUSION (PLIF) 1 LEVEL
Anesthesia: General | Site: Back

## 2013-08-18 MED ORDER — TEMAZEPAM 7.5 MG PO CAPS
30.0000 mg | ORAL_CAPSULE | Freq: Every day | ORAL | Status: DC
  Administered 2013-08-18: 30 mg via ORAL
  Administered 2013-08-19: 45 mg via ORAL
  Filled 2013-08-18 (×3): qty 4

## 2013-08-18 MED ORDER — SODIUM CHLORIDE 0.9 % IR SOLN
Status: DC | PRN
  Administered 2013-08-18: 09:00:00

## 2013-08-18 MED ORDER — ACETAMINOPHEN 650 MG RE SUPP: 650.0000 mg | RECTAL | Status: DC | PRN

## 2013-08-18 MED ORDER — AMPICILLIN 500 MG PO CAPS
500.0000 mg | ORAL_CAPSULE | Freq: Four times a day (QID) | ORAL | Status: DC
  Administered 2013-08-19 – 2013-08-20 (×3): 500 mg via ORAL
  Filled 2013-08-18 (×12): qty 1

## 2013-08-18 MED ORDER — BUPIVACAINE HCL (PF) 0.25 % IJ SOLN
INTRAMUSCULAR | Status: DC | PRN
  Administered 2013-08-18: 10 mL

## 2013-08-18 MED ORDER — PROPOFOL 10 MG/ML IV BOLUS
INTRAVENOUS | Status: AC
  Filled 2013-08-18: qty 20

## 2013-08-18 MED ORDER — DOCUSATE SODIUM 100 MG PO CAPS
100.0000 mg | ORAL_CAPSULE | Freq: Every evening | ORAL | Status: DC | PRN
  Administered 2013-08-18 – 2013-08-19 (×2): 100 mg via ORAL

## 2013-08-18 MED ORDER — SENNOSIDES-DOCUSATE SODIUM 8.6-50 MG PO TABS
2.0000 | ORAL_TABLET | Freq: Every evening | ORAL | Status: DC | PRN
  Administered 2013-08-19: 1 via ORAL
  Filled 2013-08-18: qty 2

## 2013-08-18 MED ORDER — PHENYLEPHRINE 40 MCG/ML (10ML) SYRINGE FOR IV PUSH (FOR BLOOD PRESSURE SUPPORT)
PREFILLED_SYRINGE | INTRAVENOUS | Status: AC
  Filled 2013-08-18: qty 10

## 2013-08-18 MED ORDER — ARTIFICIAL TEARS OP OINT
TOPICAL_OINTMENT | OPHTHALMIC | Status: DC | PRN
  Administered 2013-08-18: 1 via OPHTHALMIC

## 2013-08-18 MED ORDER — LIDOCAINE HCL (CARDIAC) 20 MG/ML IV SOLN
INTRAVENOUS | Status: AC
  Filled 2013-08-18: qty 5

## 2013-08-18 MED ORDER — EPHEDRINE SULFATE 50 MG/ML IJ SOLN
INTRAMUSCULAR | Status: DC | PRN
  Administered 2013-08-18: 10 mg via INTRAVENOUS
  Administered 2013-08-18: 5 mg via INTRAVENOUS
  Administered 2013-08-18: 10 mg via INTRAVENOUS

## 2013-08-18 MED ORDER — DOCUSATE SODIUM 100 MG PO CAPS
100.0000 mg | ORAL_CAPSULE | Freq: Two times a day (BID) | ORAL | Status: DC
  Administered 2013-08-20: 100 mg via ORAL
  Filled 2013-08-18 (×2): qty 1

## 2013-08-18 MED ORDER — 0.9 % SODIUM CHLORIDE (POUR BTL) OPTIME
TOPICAL | Status: DC | PRN
  Administered 2013-08-18: 1000 mL

## 2013-08-18 MED ORDER — DEXAMETHASONE SODIUM PHOSPHATE 10 MG/ML IJ SOLN
INTRAMUSCULAR | Status: DC | PRN
  Administered 2013-08-18: 10 mg via INTRAVENOUS

## 2013-08-18 MED ORDER — DICLOFENAC EPOLAMINE 1.3 % TD PTCH
1.0000 | MEDICATED_PATCH | Freq: Two times a day (BID) | TRANSDERMAL | Status: DC
  Administered 2013-08-20: 1 via TRANSDERMAL
  Filled 2013-08-18 (×7): qty 1

## 2013-08-18 MED ORDER — LACTATED RINGERS IV SOLN
INTRAVENOUS | Status: DC | PRN
  Administered 2013-08-18 (×2): via INTRAVENOUS

## 2013-08-18 MED ORDER — FENTANYL CITRATE 0.05 MG/ML IJ SOLN
INTRAMUSCULAR | Status: DC | PRN
  Administered 2013-08-18 (×2): 50 ug via INTRAVENOUS
  Administered 2013-08-18: 100 ug via INTRAVENOUS
  Administered 2013-08-18: 50 ug via INTRAVENOUS

## 2013-08-18 MED ORDER — DULOXETINE HCL 60 MG PO CPEP
60.0000 mg | ORAL_CAPSULE | Freq: Two times a day (BID) | ORAL | Status: DC
  Administered 2013-08-18 – 2013-08-20 (×3): 60 mg via ORAL
  Filled 2013-08-18 (×6): qty 1

## 2013-08-18 MED ORDER — BUPROPION HCL ER (SR) 100 MG PO TB12
200.0000 mg | ORAL_TABLET | Freq: Two times a day (BID) | ORAL | Status: DC
  Administered 2013-08-18 – 2013-08-20 (×3): 200 mg via ORAL
  Filled 2013-08-18 (×6): qty 2

## 2013-08-18 MED ORDER — SUCCINYLCHOLINE CHLORIDE 20 MG/ML IJ SOLN
INTRAMUSCULAR | Status: AC
  Filled 2013-08-18: qty 1

## 2013-08-18 MED ORDER — THROMBIN 20000 UNITS EX SOLR
CUTANEOUS | Status: DC | PRN
  Administered 2013-08-18: 09:00:00 via TOPICAL

## 2013-08-18 MED ORDER — EPHEDRINE SULFATE 50 MG/ML IJ SOLN
INTRAMUSCULAR | Status: AC
  Filled 2013-08-18: qty 1

## 2013-08-18 MED ORDER — AMPHETAMINE-DEXTROAMPHET ER 30 MG PO CP24
30.0000 mg | ORAL_CAPSULE | Freq: Two times a day (BID) | ORAL | Status: DC
Start: 2013-08-18 — End: 2013-08-20
  Administered 2013-08-19 – 2013-08-20 (×4): 30 mg via ORAL
  Filled 2013-08-18 (×4): qty 3

## 2013-08-18 MED ORDER — ONDANSETRON HCL 4 MG/2ML IJ SOLN
4.0000 mg | INTRAMUSCULAR | Status: DC | PRN
Start: 2013-08-18 — End: 2013-08-20

## 2013-08-18 MED ORDER — MIDAZOLAM HCL 5 MG/5ML IJ SOLN
INTRAMUSCULAR | Status: DC | PRN
  Administered 2013-08-18: 2 mg via INTRAVENOUS

## 2013-08-18 MED ORDER — METAXALONE 800 MG PO TABS
800.0000 mg | ORAL_TABLET | Freq: Three times a day (TID) | ORAL | Status: DC
  Administered 2013-08-18 – 2013-08-20 (×6): 800 mg via ORAL
  Filled 2013-08-18 (×8): qty 1

## 2013-08-18 MED ORDER — CEFAZOLIN SODIUM-DEXTROSE 2-3 GM-% IV SOLR
INTRAVENOUS | Status: DC | PRN
  Administered 2013-08-18 (×2): 2 g via INTRAVENOUS

## 2013-08-18 MED ORDER — PROPOFOL 10 MG/ML IV BOLUS
INTRAVENOUS | Status: DC | PRN
  Administered 2013-08-18: 200 mg via INTRAVENOUS

## 2013-08-18 MED ORDER — FENTANYL CITRATE 0.05 MG/ML IJ SOLN
INTRAMUSCULAR | Status: AC
  Filled 2013-08-18: qty 5

## 2013-08-18 MED ORDER — OXYCODONE-ACETAMINOPHEN 5-325 MG PO TABS
1.0000 | ORAL_TABLET | ORAL | Status: DC | PRN
  Administered 2013-08-20 (×3): 1 via ORAL
  Filled 2013-08-18 (×3): qty 1

## 2013-08-18 MED ORDER — OXYCODONE HCL 5 MG/5ML PO SOLN: 5.0000 mg | Freq: Once | ORAL | Status: DC | PRN

## 2013-08-18 MED ORDER — MENTHOL 3 MG MT LOZG: 1.0000 | LOZENGE | OROMUCOSAL | Status: DC | PRN

## 2013-08-18 MED ORDER — PHENOL 1.4 % MT LIQD: 1.0000 | OROMUCOSAL | Status: DC | PRN

## 2013-08-18 MED ORDER — CYCLOBENZAPRINE HCL 10 MG PO TABS: 10.0000 mg | ORAL_TABLET | Freq: Three times a day (TID) | ORAL | Status: DC | PRN

## 2013-08-18 MED ORDER — METOCLOPRAMIDE HCL 5 MG/ML IJ SOLN
INTRAMUSCULAR | Status: DC | PRN
  Administered 2013-08-18: 10 mg via INTRAVENOUS

## 2013-08-18 MED ORDER — ACETAMINOPHEN 325 MG PO TABS: 650.0000 mg | ORAL_TABLET | ORAL | Status: DC | PRN

## 2013-08-18 MED ORDER — ROCURONIUM BROMIDE 50 MG/5ML IV SOLN
INTRAVENOUS | Status: AC
  Filled 2013-08-18: qty 1

## 2013-08-18 MED ORDER — SODIUM CHLORIDE 0.9 % IJ SOLN
INTRAMUSCULAR | Status: AC
  Filled 2013-08-18: qty 10

## 2013-08-18 MED ORDER — METOCLOPRAMIDE HCL 5 MG/ML IJ SOLN: 10.0000 mg | Freq: Once | INTRAMUSCULAR | Status: DC | PRN

## 2013-08-18 MED ORDER — LEVOTHYROXINE SODIUM 50 MCG PO TABS
50.0000 ug | ORAL_TABLET | Freq: Every day | ORAL | Status: DC
Start: 2013-08-19 — End: 2013-08-20
  Administered 2013-08-19 – 2013-08-20 (×2): 50 ug via ORAL
  Filled 2013-08-18 (×3): qty 1

## 2013-08-18 MED ORDER — GABAPENTIN 300 MG PO CAPS
300.0000 mg | ORAL_CAPSULE | Freq: Three times a day (TID) | ORAL | Status: DC
  Administered 2013-08-18 – 2013-08-20 (×6): 300 mg via ORAL
  Filled 2013-08-18 (×8): qty 1

## 2013-08-18 MED ORDER — PHENYLEPHRINE HCL 10 MG/ML IJ SOLN
INTRAMUSCULAR | Status: DC | PRN
  Administered 2013-08-18 (×2): 120 ug via INTRAVENOUS
  Administered 2013-08-18: 40 ug via INTRAVENOUS
  Administered 2013-08-18: 120 ug via INTRAVENOUS

## 2013-08-18 MED ORDER — LIDOCAINE HCL 4 % MT SOLN
OROMUCOSAL | Status: DC | PRN
  Administered 2013-08-18: 4 mL via TOPICAL

## 2013-08-18 MED ORDER — LIDOCAINE-EPINEPHRINE 1 %-1:100000 IJ SOLN
INTRAMUSCULAR | Status: DC | PRN
  Administered 2013-08-18: 10 mL

## 2013-08-18 MED ORDER — ARTIFICIAL TEARS OP OINT
TOPICAL_OINTMENT | OPHTHALMIC | Status: AC
  Filled 2013-08-18: qty 3.5

## 2013-08-18 MED ORDER — METOCLOPRAMIDE HCL 5 MG/ML IJ SOLN
INTRAMUSCULAR | Status: AC
  Filled 2013-08-18: qty 2

## 2013-08-18 MED ORDER — FAMOTIDINE 10 MG PO TABS
10.0000 mg | ORAL_TABLET | Freq: Every day | ORAL | Status: DC
  Administered 2013-08-19 – 2013-08-20 (×2): 10 mg via ORAL
  Filled 2013-08-18 (×3): qty 1

## 2013-08-18 MED ORDER — PHENYLEPHRINE HCL 10 MG/ML IJ SOLN
10.0000 mg | INTRAMUSCULAR | Status: DC | PRN
  Administered 2013-08-18: 40 ug/min via INTRAVENOUS

## 2013-08-18 MED ORDER — IBUPROFEN 800 MG PO TABS
800.0000 mg | ORAL_TABLET | Freq: Four times a day (QID) | ORAL | Status: DC | PRN
  Filled 2013-08-18: qty 1

## 2013-08-18 MED ORDER — SUCCINYLCHOLINE CHLORIDE 20 MG/ML IJ SOLN
INTRAMUSCULAR | Status: DC | PRN
  Administered 2013-08-18: 120 mg via INTRAVENOUS

## 2013-08-18 MED ORDER — CEFAZOLIN SODIUM 1-5 GM-% IV SOLN
1.0000 g | Freq: Three times a day (TID) | INTRAVENOUS | Status: AC
  Administered 2013-08-18 – 2013-08-19 (×2): 1 g via INTRAVENOUS
  Filled 2013-08-18 (×2): qty 50

## 2013-08-18 MED ORDER — MIDAZOLAM HCL 2 MG/2ML IJ SOLN
INTRAMUSCULAR | Status: AC
  Filled 2013-08-18: qty 2

## 2013-08-18 MED ORDER — PROPOFOL 10 MG/ML IV EMUL
INTRAVENOUS | Status: AC
  Filled 2013-08-18: qty 50

## 2013-08-18 MED ORDER — SODIUM CHLORIDE 0.9 % IV SOLN: 250.0000 mL | INTRAVENOUS | Status: DC

## 2013-08-18 MED ORDER — ALUM & MAG HYDROXIDE-SIMETH 200-200-20 MG/5ML PO SUSP: 30.0000 mL | Freq: Four times a day (QID) | ORAL | Status: DC | PRN

## 2013-08-18 MED ORDER — HYDROMORPHONE HCL PF 1 MG/ML IJ SOLN: 0.5000 mg | INTRAMUSCULAR | Status: DC | PRN

## 2013-08-18 MED ORDER — LIDOCAINE HCL (CARDIAC) 20 MG/ML IV SOLN
INTRAVENOUS | Status: DC | PRN
  Administered 2013-08-18: 40 mg via INTRAVENOUS

## 2013-08-18 MED ORDER — GLYCOPYRROLATE 0.2 MG/ML IJ SOLN
INTRAMUSCULAR | Status: AC
  Filled 2013-08-18: qty 3

## 2013-08-18 MED ORDER — NEOSTIGMINE METHYLSULFATE 10 MG/10ML IV SOLN
INTRAVENOUS | Status: AC
  Filled 2013-08-18: qty 1

## 2013-08-18 MED ORDER — SODIUM CHLORIDE 0.9 % IJ SOLN: 3.0000 mL | INTRAMUSCULAR | Status: DC | PRN

## 2013-08-18 MED ORDER — HYDROMORPHONE HCL PF 1 MG/ML IJ SOLN: 0.2500 mg | INTRAMUSCULAR | Status: DC | PRN

## 2013-08-18 MED ORDER — OXYCODONE HCL 5 MG PO TABS: 5.0000 mg | ORAL_TABLET | Freq: Once | ORAL | Status: DC | PRN

## 2013-08-18 MED ORDER — OXYCODONE-ACETAMINOPHEN 5-325 MG PO TABS: 2.0000 | ORAL_TABLET | Freq: Four times a day (QID) | ORAL | Status: DC | PRN

## 2013-08-18 MED ORDER — SODIUM CHLORIDE 0.9 % IJ SOLN
3.0000 mL | Freq: Two times a day (BID) | INTRAMUSCULAR | Status: DC
  Administered 2013-08-18 – 2013-08-20 (×3): 3 mL via INTRAVENOUS

## 2013-08-18 MED ORDER — ONDANSETRON HCL 4 MG/2ML IJ SOLN
INTRAMUSCULAR | Status: AC
  Filled 2013-08-18: qty 2

## 2013-08-18 MED ORDER — ONDANSETRON HCL 4 MG/2ML IJ SOLN
INTRAMUSCULAR | Status: DC | PRN
  Administered 2013-08-18: 4 mg via INTRAVENOUS

## 2013-08-18 MED ORDER — GLYCOPYRROLATE 0.2 MG/ML IJ SOLN
INTRAMUSCULAR | Status: DC | PRN
  Administered 2013-08-18: .2 mg via INTRAVENOUS
  Administered 2013-08-18: 0.2 mg via INTRAVENOUS

## 2013-08-18 SURGICAL SUPPLY — 75 items
BAG DECANTER FOR FLEXI CONT (MISCELLANEOUS) ×2 IMPLANT
BENZOIN TINCTURE PRP APPL 2/3 (GAUZE/BANDAGES/DRESSINGS) ×2 IMPLANT
BLADE 10 SAFETY STRL DISP (BLADE) IMPLANT
BLADE SURG ROTATE 9660 (MISCELLANEOUS) IMPLANT
BONE MATRIX OSTEOCEL PRO MED (Bone Implant) ×2 IMPLANT
BUR MATCHSTICK NEURO 3.0 LAGG (BURR) ×2 IMPLANT
CAGE COROENT MP 8X9X23M-8 SPIN (Cage) ×4 IMPLANT
CANISTER SUCT 3000ML (MISCELLANEOUS) ×2 IMPLANT
CLIP NEUROVISION LG (CLIP) ×2 IMPLANT
CONT SPEC 4OZ CLIKSEAL STRL BL (MISCELLANEOUS) ×4 IMPLANT
COVER BACK TABLE 24X17X13 BIG (DRAPES) IMPLANT
COVER TABLE BACK 60X90 (DRAPES) ×2 IMPLANT
DERMABOND ADHESIVE PROPEN (GAUZE/BANDAGES/DRESSINGS) ×2
DERMABOND ADVANCED .7 DNX6 (GAUZE/BANDAGES/DRESSINGS) ×2 IMPLANT
DRAPE C-ARM 42X72 X-RAY (DRAPES) ×4 IMPLANT
DRAPE C-ARMOR (DRAPES) ×2 IMPLANT
DRAPE LAPAROTOMY 100X72X124 (DRAPES) ×2 IMPLANT
DRAPE POUCH INSTRU U-SHP 10X18 (DRAPES) ×2 IMPLANT
DRAPE SURG 17X23 STRL (DRAPES) ×2 IMPLANT
DRESSING TELFA 8X3 (GAUZE/BANDAGES/DRESSINGS) ×2 IMPLANT
DRSG OPSITE 4X5.5 SM (GAUZE/BANDAGES/DRESSINGS) ×2 IMPLANT
DRSG OPSITE POSTOP 4X6 (GAUZE/BANDAGES/DRESSINGS) ×2 IMPLANT
DURAPREP 26ML APPLICATOR (WOUND CARE) ×2 IMPLANT
ELECT BLADE 4.0 EZ CLEAN MEGAD (MISCELLANEOUS) ×2
ELECT REM PT RETURN 9FT ADLT (ELECTROSURGICAL) ×2
ELECTRODE BLDE 4.0 EZ CLN MEGD (MISCELLANEOUS) ×1 IMPLANT
ELECTRODE REM PT RTRN 9FT ADLT (ELECTROSURGICAL) ×1 IMPLANT
EVACUATOR 1/8 PVC DRAIN (DRAIN) ×2 IMPLANT
EVACUATOR 3/16  PVC DRAIN (DRAIN) ×1
EVACUATOR 3/16 PVC DRAIN (DRAIN) ×1 IMPLANT
GAUZE SPONGE 4X4 16PLY XRAY LF (GAUZE/BANDAGES/DRESSINGS) IMPLANT
GLOVE BIO SURGEON STRL SZ8 (GLOVE) ×2 IMPLANT
GLOVE BIOGEL PI IND STRL 7.0 (GLOVE) ×1 IMPLANT
GLOVE BIOGEL PI IND STRL 8 (GLOVE) ×2 IMPLANT
GLOVE BIOGEL PI INDICATOR 7.0 (GLOVE) ×1
GLOVE BIOGEL PI INDICATOR 8 (GLOVE) ×2
GLOVE ECLIPSE 8.5 STRL (GLOVE) ×2 IMPLANT
GLOVE INDICATOR 8.5 STRL (GLOVE) ×2 IMPLANT
GLOVE SS N UNI LF 7.0 STRL (GLOVE) ×8 IMPLANT
GOWN BRE IMP SLV AUR LG STRL (GOWN DISPOSABLE) IMPLANT
GOWN BRE IMP SLV AUR XL STRL (GOWN DISPOSABLE) IMPLANT
GOWN STRL REIN 2XL LVL4 (GOWN DISPOSABLE) IMPLANT
GOWN STRL REUS W/ TWL XL LVL3 (GOWN DISPOSABLE) ×3 IMPLANT
GOWN STRL REUS W/TWL XL LVL3 (GOWN DISPOSABLE) ×3
HEMOSTAT POWDER KIT SURGIFOAM (HEMOSTASIS) IMPLANT
KIT BASIN OR (CUSTOM PROCEDURE TRAY) ×2 IMPLANT
KIT NEEDLE NVM5 EMG ELECT (KITS) ×1 IMPLANT
KIT NEEDLE NVM5 EMG ELECTRODE (KITS) ×1
KIT ROOM TURNOVER OR (KITS) ×2 IMPLANT
MILL MEDIUM DISP (BLADE) ×2 IMPLANT
NEEDLE HYPO 25X1 1.5 SAFETY (NEEDLE) ×2 IMPLANT
NS IRRIG 1000ML POUR BTL (IV SOLUTION) ×2 IMPLANT
PACK LAMINECTOMY NEURO (CUSTOM PROCEDURE TRAY) ×2 IMPLANT
PAD ARMBOARD 7.5X6 YLW CONV (MISCELLANEOUS) ×6 IMPLANT
ROD 35MM (Rod) ×4 IMPLANT
SCREW LOCK (Screw) ×4 IMPLANT
SCREW LOCK FXNS SPNE MAS PL (Screw) ×4 IMPLANT
SCREW PLIF MAS 5.0X35 (Screw) ×4 IMPLANT
SCREW SHANK 5.0X30MM (Screw) ×2 IMPLANT
SCREW SHANK 5.0X35 (Screw) ×2 IMPLANT
SCREW TULIP 5.5 (Screw) ×4 IMPLANT
SPONGE GAUZE 4X4 12PLY (GAUZE/BANDAGES/DRESSINGS) ×2 IMPLANT
SPONGE LAP 4X18 X RAY DECT (DISPOSABLE) IMPLANT
SPONGE SURGIFOAM ABS GEL 100 (HEMOSTASIS) ×2 IMPLANT
STRIP CLOSURE SKIN 1/2X4 (GAUZE/BANDAGES/DRESSINGS) ×4 IMPLANT
SUT VIC AB 0 CT1 18XCR BRD8 (SUTURE) ×1 IMPLANT
SUT VIC AB 0 CT1 8-18 (SUTURE) ×1
SUT VIC AB 2-0 CT1 18 (SUTURE) ×2 IMPLANT
SUT VICRYL 4-0 PS2 18IN ABS (SUTURE) ×2 IMPLANT
SYR 20ML ECCENTRIC (SYRINGE) ×2 IMPLANT
TAPE STRIPS DRAPE STRL (GAUZE/BANDAGES/DRESSINGS) ×2 IMPLANT
TOWEL OR 17X24 6PK STRL BLUE (TOWEL DISPOSABLE) ×2 IMPLANT
TOWEL OR 17X26 10 PK STRL BLUE (TOWEL DISPOSABLE) ×2 IMPLANT
TRAY FOLEY CATH 14FRSI W/METER (CATHETERS) ×2 IMPLANT
WATER STERILE IRR 1000ML POUR (IV SOLUTION) ×2 IMPLANT

## 2013-08-18 NOTE — Op Note (Signed)
Operative diagnosis: Grade 1 spondylolisthesis L4-5 with severe lumbar spinal stenosis and severe foraminal stenosis at the L4 and L5 nerve roots.  Postop diagnosis: Same  Procedure: Decompressive lumbar laminectomy in access and requiring more work than would be needed with a standard interbody fusion  Maximal access posterior lumbar interbody fusion using the nuvasive rotating peek cages packed with local autograft mixed with osteocel plus  Cortical screw fixation L4-5 using the invasive cortical screw set  Open reduction spinal deformity  Placement of large Hemovac drain  Surgeon: [name] Annaleigha Woo  Assistant: Mallie Mussel pool  Anesthesia: Gen.  EBL: 450  History of present illness: Patient is a very pleasant 58 year old female with long-standing chronic back pain into both legs worse on the right consistent with an L4 and L5 radiculopathy. Workup revealed severe lumbar spinal stenosis and severe foraminal stenosis due to a grade 1 spondylolisthesis at L4-5. The patient takes her treatment progression of clinical syndrome and imaging findings are recommended decompression stabilization procedure at L4-5 I extensively reviewed the risks and benefits of the operation with the patient as well as perioperative course and expectations of outcome and alternatives of surgery and she understood and agreed to proceed forward.  Operative procedure: Patient brought into the or was induced under general anesthesia positioned prone the Wilson frame her back was prepped and draped in routine sterile fashion after infiltration 10 cc lidocaine with epi a midline incision was made and Bovie light cautery was used to gas subcutaneous tissues subperiosteal dissections care lamina of L4 and L5 bilaterally. Interoperative X. identify the L4 pedicle so at this point under fluoroscopic guidance with both AP and lateral fluoroscopy, the entry point for the superior L4 cortical screws identified at the 5:00 and 7:00 positions  respectively. Using intraoperative EMG monitoring the pilot holes were drilled with a high-speed drill them with a hand drill with continuous monitoring cortical holes were drilled into the L4 pedicle bilaterally. All holes within probed tapped under monitoring probed again and 5.0 x 30 mm screws inserted on the patient's right side and a 5.0 x 35 in the patient's left side. After these 2 Screws in Pl. at this take the decompression using a high-speed drill a trough was drilled out in the inferior pars below the level of the cortical screw and a complete central decompression complete medial facetectomies were performed at the L4 pedicle was identified and decompressed flush with pedicle aggressive abutting of the superior tickling facet L4 allowed dislocation of the lateral aspect of the space and L5 screws decompressed flush with pedicle as well. At this point he epidermides are coagulated the space was incised bilaterally using sequential distraction distracted up to 10 mm distractor this significantly reduced the deformity working on the contralateral side and the distractor cleaned out the disc and cleaned out and prepared the endplates and an 8 mm rotating peek cages packed with local autograft mixed with OsteoSet loss this was rotated so that he would be 11 mm anteriorly 8 mm posteriorly and this also significantly help reduce the deformity. The distractors removed the contralateral side endplates were prepared in a similar fashion central disc was removed as well. Local are graft was then packed centrally the chondral contralateral cages and placed a similar fashion the after all interbody work been done to was taken to the remaining L5 pedicle screw was drilled on the patient's left side and with a slight upper trajectory and this was drilled tapped and a screw placed on the monitor again however  the screw broke through the superior aspect of the pedicle and the disc space so I realigned it I redirected  more straight and trajectories slightly inferior captured intact pedicle and put a 30 mm screw in. Contralateral side again straightened trajectory 35 mm screw after all screws in place the head were assembled the L4 screw the screws were screwed down 35 mm rods were placed these were all torqued down and meticulous in space was maintained Gelfoam was laid up the dura a Hemovac was placed the wounds closed in layers with after Vicryl and the skin was closed with a running 4 subcuticular benzoin and Steri-Strips were applied patient recovered in stable condition. At the [facility]. and counts sponge counts were correct.

## 2013-08-18 NOTE — Anesthesia Postprocedure Evaluation (Signed)
Anesthesia Post Note  Patient: Emily Steele  Procedure(s) Performed: Procedure(s) (LRB): FOR MAXIMUM ACCESS (MAS) POSTERIOR LUMBAR INTERBODY FUSION (PLIF) 1 LEVEL (N/A)  Anesthesia type: General  Patient location: PACU  Post pain: Pain level controlled  Post assessment: Patient's Cardiovascular Status Stable  Last Vitals:  Filed Vitals:   08/18/13 1245  BP: 101/41  Pulse: 66  Temp:   Resp: 14    Post vital signs: Reviewed and stable  Level of consciousness: alert  Complications: No apparent anesthesia complications

## 2013-08-18 NOTE — Evaluation (Signed)
Occupational Therapy Evaluation Patient Details Name: Emily Steele MRN: [id number] DOB: July 03, 1955 Today's Date: 08/18/2013    History of Present Illness 58 y.o. s/p FOR MAXIMUM ACCESS (MAS) POSTERIOR LUMBAR INTERBODY FUSION (PLIF) 1 LEVEL    Clinical Impression   Pt s/p above procedure. Pt independent with ADLs, PTA. Feel pt will benefit from acute OT to increase independence prior to d/c.    Follow Up Recommendations  Home health OT;Supervision - Intermittent    Equipment Recommendations  Other (comment) (tbd)    Recommendations for Other Services       Precautions / Restrictions Precautions Precautions: Back;Fall Precaution Booklet Issued: Yes (comment) Precaution Comments: Reviewed precautions with pt Required Braces or Orthoses: Spinal Brace Spinal Brace: Lumbar corset;Applied in sitting position Restrictions Weight Bearing Restrictions: No      Mobility Bed Mobility Overal bed mobility: Needs Assistance Bed Mobility: Rolling;Sidelying to Sit Rolling: Min assist;Supervision Sidelying to sit: Min assist       General bed mobility comments: Educated on technique. Pt wanted to practice rolling again.  Transfers Overall transfer level: Needs assistance Equipment used: Rolling walker (2 wheeled) Transfers: Sit to/from [facility] Sit to Stand: Min assist Stand pivot transfers: Min assist       General transfer comment: Cues for technique. Physical assist to lift to stand.    Balance                                            ADL Overall ADL's : Needs assistance/impaired                 Upper Body Dressing : Minimal assistance;Sitting (back brace)   Lower Body Dressing: Min guard;Sit to/from stand   Toilet Transfer: Minimal assistance;Stand-pivot;RW (from bed to chair)           Functional mobility during ADLs: Minimal assistance;Rolling walker (stand pivot; took a few steps) General ADL Comments:  Educated on use of cup for teeth care and placement of grooming items to avoid breaking precautions. Educated on AE for LB ADLs as pt initially had difficulty crossing RLE over left knee, but later in session pt able to access right sock. Educated on toilet aid for hygiene if pt would need it. Educated on use of bag on walker. Educated on back brace.     Vision                     Perception     Praxis      Pertinent Vitals/Pain Pain 1.5/10. Pt with pillow behind back at end of session; pillow also placed under feet.      Hand Dominance Right   Extremity/Trunk Assessment Upper Extremity Assessment Upper Extremity Assessment: Overall WFL for tasks assessed   Lower Extremity Assessment Lower Extremity Assessment: Defer to PT evaluation       Communication Communication Communication: No difficulties   Cognition Arousal/Alertness: Awake/alert Behavior During Therapy: Anxious Overall Cognitive Status: No family/caregiver present to determine baseline cognitive functioning       Memory: Decreased short-term memory (reports decreased memory; pt with apparent slow processing)             General Comments       Exercises       Shoulder Instructions      Home Living Family/patient expects to be discharged to:: Private residence Living Arrangements: Alone  Type of Home: House Home Access: Stairs to enter [employer] of Steps: 2 Entrance Stairs-Rails: None Home Layout: One level     Bathroom Shower/Tub: Teacher, early years/pre: Handicapped height (sink close)     Home Equipment: Grab bars - tub/shower          Prior Functioning/Environment Level of Independence: Independent             OT Diagnosis: Acute pain   OT Problem List: Decreased strength;Impaired balance (sitting and/or standing);Decreased knowledge of use of DME or AE;Decreased knowledge of precautions;Pain;Decreased cognition   OT Treatment/Interventions:  Self-care/ADL training;DME and/or AE instruction;Therapeutic activities;Patient/family education;Balance training;Cognitive remediation/compensation    OT Goals(Current goals can be found in the care plan section) Acute Rehab OT Goals Patient Stated Goal: not stated OT Goal Formulation: With patient Time For Goal Achievement: 08/25/13 Potential to Achieve Goals: Good ADL Goals Pt Will Perform Grooming: with modified independence;standing Pt Will Perform Lower Body Bathing: with modified independence;sit to/from stand Pt Will Perform Lower Body Dressing: with modified independence;sit to/from stand Pt Will Transfer to Toilet: with modified independence;ambulating;grab bars (elevated toilet) Pt Will Perform Toileting - Clothing Manipulation and hygiene: with modified independence;sit to/from stand Additional ADL Goal #1: Pt will independently verbalize and demonstrate 3/3 back precautions.  Additional ADL Goal #2: Pt will don/doff back brace independently.  OT Frequency: Min 3X/week   Barriers to D/C: Decreased caregiver support          Co-evaluation              End of Session Equipment Utilized During Treatment: Gait belt;Rolling walker;Back brace  Activity Tolerance: Patient tolerated treatment well Patient left: in chair;with call bell/phone within reach   Time: 1733-1800 OT Time Calculation (min): 27 min Charges:  OT General Charges $OT Visit: 1 Procedure OT Evaluation $Initial OT Evaluation Tier I: 1 Procedure OT Treatments $Self Care/Home Management : 8-22 mins G-Codes:    Benito Mccreedy OTR/L [phone] 08/18/2013, 6:16 PM

## 2013-08-18 NOTE — H&P (Signed)
Emily Steele is an 58 y.o. female.   Chief Complaint: Back and bilateral right greater left leg pain HPI: Patient is a 58 year old female has had progressive worsening back and bilateral pain going down her legs worse on the right. He does rate in the back or leg the patient's top foot and big toe in front of her shin. Workup has revealed a grade 1 spondylolisthesis with severe spinal stenosis and degenerative disc disease at L4-5 as well as mild multilevel degenerative disc disease and mild lumbar spinal stenosis. Patient been through epidural steroid injections physical therapy anti-inflammatories and time and due to her progression of clinical syndrome and imaging findings and failure conservative treatment I recommended a decompression stabilization procedure at L4-5 I have extensively reviewed the risks and benefits of the operation the patient as well as perioperative course and expectations of outcome and alternatives of surgery and she understands and agrees to proceed forward.  Past Medical History  Diagnosis Date  . Depression   . Fibromyalgia   . Hypothyroid   . Sleep behavior disorder, REM   . Osteopenia   . Obesity   . Plantar fasciitis   . Arthritis   . GERD (gastroesophageal reflux disease)   . Anemia   . Osteoporosis   . Rosacea   . Eczema   . Fibromyalgia   . UTI (urinary tract infection)   . Hx of migraine headaches   . Constipation   . Anxiety   . Allergy   . Hypertension     not on medications  . Brain tumor     right temporal, not ca  . Pneumonia 08/2012  . DDD (degenerative disc disease)   . Osteoarthritis   . Toenail fungus   . Hearing loss     1 ear - mild  . Herpes simplex     Past Surgical History  Procedure Laterality Date  . Tonsillectomy and adenoidectomy    . Abdominal hysterectomy    . Spine surgery      Lamincetomy and Disectomy L5- S1  . Hand surgery Bilateral     ligimant transfere to thumbs    Family History  Problem Relation Age of  Onset  . Cancer Mother   . Hypertension Mother   . Cancer Sister   . Heart disease Sister   . Asthma Sister   . Asthma Brother   . Drug abuse Brother   . Hypertension Brother    Social History:  reports that she has never smoked. She has never used smokeless tobacco. She reports that she does not drink alcohol or use illicit drugs.  Allergies:  Allergies  Allergen Reactions  . Doxycycline Other (See Comments)    headaches  . Sulfa Antibiotics     Headaches  . Tape Itching  . Methocarbamol Nausea Only    headaches    Medications Prior to Admission  Medication Sig Dispense Refill  . amphetamine-dextroamphetamine (ADDERALL XR) 30 MG 24 hr capsule Take 30 mg by mouth 2 (two) times daily before a meal.       . ampicillin (PRINCIPEN) 500 MG capsule Take 500 mg by mouth 2 (two) times daily as needed.      Marland Kitchen buPROPion (WELLBUTRIN SR) 200 MG 12 hr tablet Take 200 mg by mouth 2 (two) times daily.      . clindamycin (CLEOCIN T) 1 % external solution Apply 1 application topically 2 (two) times daily as needed (dermatits).       . diclofenac (FLECTOR)  1.3 % PTCH Place 1 patch onto the skin 2 (two) times daily.      Marland Kitchen docusate sodium (COLACE) 100 MG capsule Take 100 mg by mouth at bedtime as needed for mild constipation.      . DULoxetine (CYMBALTA) 60 MG capsule Take 60 mg by mouth 2 (two) times daily.      Marland Kitchen gabapentin (NEURONTIN) 300 MG capsule Take 300 mg by mouth 3 (three) times daily.      Marland Kitchen ibuprofen (ADVIL,MOTRIN) 200 MG tablet Take 800 mg by mouth every 6 (six) hours as needed for moderate pain.      Marland Kitchen levothyroxine (SYNTHROID, LEVOTHROID) 50 MCG tablet Take 1 tablet (50 mcg total) by mouth daily.  90 tablet  1  . metaxalone (SKELAXIN) 800 MG tablet Take 800 mg by mouth 3 (three) times daily.      Marland Kitchen oxyCODONE-acetaminophen (PERCOCET/ROXICET) 5-325 MG per tablet Take 2 tablets by mouth every 6 (six) hours as needed for severe pain.      . ranitidine (ZANTAC) 150 MG capsule 150 mg 2  (two) times daily as needed for heartburn (only takes when taking Advil).       Marland Kitchen senna-docusate (SENOKOT-S) 8.6-50 MG per tablet Take 2 tablets by mouth at bedtime as needed for mild constipation.       . temazepam (RESTORIL) 15 MG capsule Take 30-45 mg by mouth at bedtime. 2 tablets one day, next day 3 tabs, then 2 tabs again. Alternate between 2-3 tabs at bedtime.        No results found for this or any previous visit (from the past 48 hour(s)). No results found.  Review of Systems  Constitutional: Negative.   HENT: Negative.   Eyes: Negative.   Respiratory: Negative.   Cardiovascular: Negative.   Gastrointestinal: Negative.   Genitourinary: Negative.   Musculoskeletal: Positive for back pain, joint pain and myalgias.  Skin: Negative.   Neurological: Positive for sensory change.  Endo/Heme/Allergies: Negative.   Psychiatric/Behavioral: Negative.     Blood pressure 150/85, pulse 70, temperature 97.6 F (36.4 C), temperature source Oral, resp. rate 18, weight 83.462 kg (184 lb), SpO2 99.00%. Physical Exam  Constitutional: She is oriented to person, place, and time. She appears well-developed and well-nourished.  HENT:  Head: Normocephalic.  Eyes: Pupils are equal, round, and reactive to light.  Neck: Normal range of motion.  Respiratory: Effort normal.  GI: Soft. Bowel sounds are normal.  Neurological: She is alert and oriented to person, place, and time. She has normal strength. GCS eye subscore is 4. GCS verbal subscore is 5. GCS motor subscore is 6.  Strength is 5 out of 5 in her iliopsoas, quads, hip she's, gastrocs and into tibialis, and EHL.     Assessment/Plan 58 year old female presents for decompression stabilization procedure at L4-5. Patient presented also a with a living will on her chart and requests to place a DO NOT RESUSCITATE order on her chart as well. I discussed with her extensively what her wishes for with regard to this as well as extensively reviewed her  living Will that she placed on the chart. Her living Will on the chart was commensurate and standard in that is outlined which measures she did not want undertaken in the event of persistent unconsciousness or lack of reasonable expectation of recovery. However what she explained was in her wishes were regardless of whether there was a reasonable and expectations of recovery should her heart stop for any reason whatsoever or should she  be on prolonged life blood support for any reason whatsoever she did not want those life sustaining measures continued. We spent extensive time discussing this and I outlined several potential things that could happen that could result in a very reasonable and treatable condition with regard to things are degenerated dysrhythmias or arrhythmias as well as things that could require prolonged postoperative ventilation. She again expressed not wanting these measures continued. I discussed this extensively with her explained that I thought that that was somewhat unreasonable. In addition I discussed this with the anesthesiologist who is also discussed this with the patient. After extensive discussions and then explained the patient that we did not feel we can proceed forward with surgery and/or anesthesia feeling paralyzed in the ability to treat any potential complication and may come from the delivery of either during this time of surgery she understood and agreed to proceed forward with the procedure on the stability as of her living will which do recognize and stimulate persistent unconsciousness and root lack of reasonable expectation recovered. She did ultimately expressed her desire to get better into her car in the recovery she did say that she did understand that initially after surgery the postoperative period would be more difficult and she would have to work through extension she was looking forward to that rehabilitation. I counseled her extensively about suicide and suicidal  ideation and she said that I picked up no indication that that was her desire that she would pursue and that road. So after extensive amount of time and discussion we have elected to proceed forward a decompressive laminectomy and fusion L4-5.  Elaina Hoops 08/18/2013, 7:28 AM

## 2013-08-18 NOTE — Anesthesia Preprocedure Evaluation (Signed)
Anesthesia Evaluation  Patient identified by MRN, date of birth, ID band Patient awake    Reviewed: Allergy & Precautions, H&P , NPO status , Patient's Chart, lab work & pertinent test results, reviewed documented beta blocker date and time   Airway Mallampati: II TM Distance: >3 FB Neck ROM: full    Dental   Pulmonary pneumonia -, resolved,  breath sounds clear to auscultation        Cardiovascular hypertension, Pt. on medications Rhythm:regular     Neuro/Psych PSYCHIATRIC DISORDERS Anxiety Depression  Neuromuscular disease    GI/Hepatic Neg liver ROS, GERD-  Medicated and Controlled,  Endo/Other  Hypothyroidism   Renal/GU negative Renal ROS  negative genitourinary   Musculoskeletal  (+) Fibromyalgia -  Abdominal   Peds  Hematology negative hematology ROS (+) anemia ,   Anesthesia Other Findings See surgeon's H&P   Reproductive/Obstetrics negative OB ROS                           Anesthesia Physical Anesthesia Plan  ASA: III  Anesthesia Plan: General   Post-op Pain Management:    Induction: Intravenous  Airway Management Planned: Oral ETT  Additional Equipment:   Intra-op Plan:   Post-operative Plan: Extubation in OR  Informed Consent: I have reviewed the patients History and Physical, chart, labs and discussed the procedure including the risks, benefits and alternatives for the proposed anesthesia with the patient or authorized representative who has indicated his/her understanding and acceptance.   Dental Advisory Given  Plan Discussed with: CRNA and Surgeon  Anesthesia Plan Comments:         Anesthesia Quick Evaluation

## 2013-08-18 NOTE — Anesthesia Procedure Notes (Signed)
Procedure Name: Intubation Date/Time: 08/18/2013 8:03 AM Performed by: Ned Grace Pre-anesthesia Checklist: Patient identified, Patient being monitored, Emergency Drugs available, Timeout performed and Suction available Patient Re-evaluated:Patient Re-evaluated prior to inductionOxygen Delivery Method: Circle system utilized Preoxygenation: Pre-oxygenation with 100% oxygen Intubation Type: IV induction Ventilation: Mask ventilation without difficulty Laryngoscope Size: Mac and 3 Grade View: Grade I Tube type: Oral Tube size: 7.5 mm Number of attempts: 1 Airway Equipment and Method: Stylet Placement Confirmation: ETT inserted through vocal cords under direct vision,  breath sounds checked- equal and bilateral and positive ETCO2 Secured at: 21 cm Tube secured with: Tape Dental Injury: Teeth and Oropharynx as per pre-operative assessment

## 2013-08-18 NOTE — Transfer of Care (Signed)
Immediate Anesthesia Transfer of Care Note  Patient: Emily Steele  Procedure(s) Performed: Procedure(s) with comments: FOR MAXIMUM ACCESS (MAS) POSTERIOR LUMBAR INTERBODY FUSION (PLIF) 1 LEVEL (N/A) - FOR MAXIMUM ACCESS (MAS) POSTERIOR LUMBAR INTERBODY FUSION (PLIF) 1 LEVEL L4-5  Patient Location: PACU  Anesthesia Type:General  Level of Consciousness: awake, alert  and oriented  Airway & Oxygen Therapy: Patient Spontanous Breathing and Patient connected to nasal cannula oxygen  Post-op Assessment: Report given to PACU RN, Post -op Vital signs reviewed and stable and Patient moving all extremities X 4  Post vital signs: Reviewed and stable  Complications: No apparent anesthesia complications

## 2013-08-18 NOTE — Progress Notes (Signed)
Utilization Review Completed.[name] T Dowell5/07/2013

## 2013-08-19 ENCOUNTER — Encounter (HOSPITAL_COMMUNITY): Payer: Self-pay | Admitting: General Practice

## 2013-08-19 ENCOUNTER — Telehealth: Payer: Self-pay

## 2013-08-19 MED ORDER — VALACYCLOVIR HCL 500 MG PO TABS
500.0000 mg | ORAL_TABLET | Freq: Every day | ORAL | Status: DC
  Administered 2013-08-19 – 2013-08-20 (×2): 500 mg via ORAL
  Filled 2013-08-19 (×2): qty 1

## 2013-08-19 NOTE — Progress Notes (Signed)
Patient has now voided in the BR post foley removal

## 2013-08-19 NOTE — Telephone Encounter (Signed)
Emily Steele called to inquire about a medication she was prescribed for cold soresNorton Blizzard s/p?), she would like to know if there is an equivalent because her pharmacy does not carry that brand. Please call  She is @ [phone] until 5/6 am ( this is a [place] number will be discharged in am)

## 2013-08-19 NOTE — Progress Notes (Signed)
Subjective: Patient reports overall she's doing great no back pain no leg pain  Objective: Vital signs in last 24 hours: Temp:  [96.8 F (36 C)-98.7 F (37.1 C)] 98.2 F (36.8 C) (05/05 0535) Pulse Rate:  [58-76] 69 (05/05 0535) Resp:  [14-20] 18 (05/05 0535) BP: (92-109)/(41-69) 95/54 mmHg (05/05 0535) SpO2:  [97 %-100 %] 100 % (05/05 0535) Weight:  [81.647 kg (180 lb)] 81.647 kg (180 lb) (05/04 1829)  Intake/Output from previous day: 05/04 0701 - 05/05 0700 In: 2500 [I.V.:2500] Out: 1165 [Urine:450; Drains:265; Blood:450] Intake/Output this shift:    Strength out of 5 wound clean and dry  Lab Results: No results found for this basename: WBC, HGB, HCT, PLT,  in the last 72 hours BMET No results found for this basename: NA, K, CL, CO2, GLUCOSE, BUN, CREATININE, CALCIUM,  in the last 72 hours  Studies/Results: Dg Lumbar Spine 2-3 Views  08/18/2013   CLINICAL DATA:  L4-5 PLIF.  EXAM: LUMBAR SPINE - 2-3 VIEW; DG C-ARM 61-120 MIN  COMPARISON:  No comparisons  FINDINGS: Intraoperative fluoroscopic spot views of the lumbosacral spine are submitted. Osseous detail is markedly degraded by technique. Based on the clinical history and comparison with 08/12/2013, the pedicle screws appear to be at L4 and L5 with an interbody spacer.  IMPRESSION: Intraoperative visualization for L4-5 PLIF.   Electronically Signed   By: Lorin Picket M.D.   On: 08/18/2013 15:22   Dg C-arm 61-120 Min  08/18/2013   CLINICAL DATA:  L4-5 PLIF.  EXAM: LUMBAR SPINE - 2-3 VIEW; DG C-ARM 61-120 MIN  COMPARISON:  No comparisons  FINDINGS: Intraoperative fluoroscopic spot views of the lumbosacral spine are submitted. Osseous detail is markedly degraded by technique. Based on the clinical history and comparison with 08/12/2013, the pedicle screws appear to be at L4 and L5 with an interbody spacer.  IMPRESSION: Intraoperative visualization for L4-5 PLIF.   Electronically Signed   By: Lorin Picket M.D.   On: 08/18/2013  15:22    Assessment/Plan: Progressive mobilization today with physical therapy DC her Foley possible discharge tomorrow a.m.  LOS: 1 day     Elaina Hoops 08/19/2013, 7:30 AM

## 2013-08-19 NOTE — Evaluation (Signed)
Physical Therapy Evaluation Patient Details Name: Emily Steele MRN: [id number] DOB: 1955-09-12 Today's Date: 08/19/2013   History of Present Illness  58 y.o. female admitted to [facility] on 08/18/13 for elective L4/5 max access PLIF.  She has significant PMHx of anxiety, fibromyalgia, R temoral brain tumor (not CA, per pt leaving her with memory deficits), and bil thumb surgery.    Clinical Impression  Pt is POD #1 s/p one level lumbar fusion.  She is moving very well and has lots of questions about her precautions.  Questions answered.  PT to check back tomorrow if pt is still here.      Follow Up Recommendations No PT follow up;Other (comment) (OP PT when MD deems appropriate after f/u)    Equipment Recommendations  Rolling walker with 5" wheels;3in1 (PT)    Recommendations for Other Services   NA    Precautions / Restrictions Precautions Precautions: Back;Fall Precaution Booklet Issued: Yes (comment) Precaution Comments: handout given by OT, reviewed with PT Required Braces or Orthoses: Spinal Brace Spinal Brace: Lumbar corset;Applied in sitting position      Mobility   Transfers Overall transfer level: Needs assistance Equipment used: Rolling walker (2 wheeled) Transfers: Sit to/from Stand Sit to Stand: Supervision         General transfer comment: supervision for safety.  Pt able to demonstrate safe technique.   Ambulation/Gait Ambulation/Gait assistance: Supervision Ambulation Distance (Feet): 400 Feet Assistive device: Rolling walker (2 wheeled) Gait Pattern/deviations: Step-through pattern     General Gait Details: RW adjusted for height. Pt moving well with no obvious signs of weakness or buckling in legs.   Stairs Stairs: Yes Stairs assistance: Supervision Stair Management: One rail Right;Step to pattern;Forwards Number of Stairs: 5 General stair comments: Pt taking her time and going up one step at a time, she was able to lead and go down with either leg.           Balance Overall balance assessment: No apparent balance deficits (not formally assessed) (pt reports h/o imbalance PTA)                                           Pertinent Vitals/Pain See vitals flow sheet.     Home Living Family/patient expects to be discharged to:: Private residence Living Arrangements: Alone Available Help at Discharge: Friend(s);Available PRN/intermittently Type of Home: House Home Access: Stairs to enter Entrance Stairs-Rails: None Entrance Stairs-Number of Steps: 2 (small) Home Layout: One level Home Equipment: Grab bars - tub/shower      Prior Function Level of Independence: Independent               Hand Dominance   Dominant Hand: Right    Extremity/Trunk Assessment   Upper Extremity Assessment: Defer to OT evaluation           Lower Extremity Assessment: RLE deficits/detail RLE Deficits / Details: per pt report she feels that right leg is weaker when walking and standing.  Per gross MMT seated legs appear to be equal in strength at least 4/5.      Cervical / Trunk Assessment: Other exceptions  Communication   Communication: No difficulties  Cognition Arousal/Alertness: Awake/alert Behavior During Therapy: Anxious Overall Cognitive Status: History of cognitive impairments - at baseline       Memory: Decreased short-term memory (pt writes everything down)  General Comments General comments (skin integrity, edema, etc.): Educated pt on progressive walking 3 times per day at home, and this should be her main form of exercise.  Pool therapy and her back stretches and exercises she was doing PTA need to be approved by the surgeon for her to start doing them again.  Educated on no sitting statically for >30-45 mins at one time, and no lifting >5 lbs.           Assessment/Plan    PT Assessment Patient needs continued PT services  PT Diagnosis Difficulty walking;Abnormality of  gait;Generalized weakness;Acute pain   PT Problem List Decreased strength;Decreased activity tolerance;Decreased balance;Decreased mobility;Decreased knowledge of use of DME;Decreased safety awareness;Decreased knowledge of precautions;Pain  PT Treatment Interventions DME instruction;Gait training;Stair training;Functional mobility training;Therapeutic activities;Therapeutic exercise;Balance training;Neuromuscular re-education;Patient/family education;Modalities   PT Goals (Current goals can be found in the Care Plan section) Acute Rehab PT Goals Patient Stated Goal: to go home PT Goal Formulation: With patient Time For Goal Achievement: 08/26/13 Potential to Achieve Goals: Good    Frequency Min 5X/week    End of Session Equipment Utilized During Treatment: Gait belt;Back brace Activity Tolerance: Patient tolerated treatment well Patient left: in chair;with call bell/phone within reach           Time: 0947-1026 PT Time Calculation (min): 39 min   Charges:   PT Evaluation $Initial PT Evaluation Tier I: 1 Procedure PT Treatments $Gait Training: 8-22 mins $Self Care/Home Management: 8-22        Margit Batte B. [place], [place], DPT [phone]   08/19/2013, 1:13 PM

## 2013-08-19 NOTE — Progress Notes (Signed)
Dr Saintclair Halsted said we could order either Valtrex or Acyclovir for her cold sores, whichever our pharmacy provides-- i spoke to pharmacy and we have both-- valtrex was ordered and she should get a dose tonight to make her feel better about her sores.

## 2013-08-19 NOTE — Telephone Encounter (Signed)
Not really sure what she is inquiring about. Unable to find where we have seen her for HSV I or where we have prescribed her anything for it. Tried to call to get more details at the number below but no answer/no VM

## 2013-08-19 NOTE — Care Management Note (Signed)
    Page 1 of 2   08/20/2013     3:13:13 PM CARE MANAGEMENT NOTE 08/20/2013  Patient:  Emily Steele, Emily Steele   Account Number:  [id number]  Date Initiated:  08/19/2013  Documentation initiated by:  Lorne Skeens  Subjective/Objective Assessment:   Patient was admitted for PLIF. Lives at home alone.     Action/Plan:   Will follow for discharge needs pending PT/OT evals and physician orders   Anticipated DC Date:  08/20/2013   Anticipated DC Plan:  [place]  CM consult      Choice offered to / List presented to:  C-1 Patient   DME arranged  3-N-1  [place]      DME agency  [place] arranged  HH-1 RN      [place].   Status of service:  Completed, signed off Medicare Important Message given?   (If response is "NO", the following Medicare IM given date fields will be blank) Date Medicare IM given:   Date Additional Medicare IM given:    Discharge Disposition:  [place]  Per UR Regulation:  Reviewed for med. necessity/level of care/duration of stay  If discussed at [place] of Stay Meetings, dates discussed:    Comments:  08/20/13 [place], MSN, CM- Met with patient to further discuss home health needs.  Patient has chosen [facility] for her [facility], who will perform a dressing change on Friday.  CM reiterated the need for patient to utilize her own resources to find personal assistance for household tasks such as rearranging her kitchen, grocery shopping etc.  [facility] was notified of need for equipment for discharge home today.    08/19/13 [place], MSN, CM- Met with patient at her request to answer questions and provide resources. Patient is requesting assitance with household chores etc once she is discharged.  CM explained that insurance will not cover these services and provided a private duty agency list to see if they  would be able to provide asssitance. Patient also requested assistance contacting her physical therapist office, as she needs new patiches for her TENS unit.  Because patient was unable to provide the therapist's name, CM printed an extensive list of [place] area therapists.  Patient is requesting someone to come to the house to do post-op dressing changes, if indicated.  She is also requesting a rolling walker, 3N1, and tub seat.  CM left a note in the chart for Dr Saintclair Halsted.  RN was also updated.  CM will continue to follow.

## 2013-08-20 NOTE — Progress Notes (Signed)
Patient requested to speak with the Transition manager,TM  Was made aware.

## 2013-08-20 NOTE — Discharge Instructions (Signed)
No lifting no bending no twisting no driving a riding a car in that she's come back and forth to see me. Keep incision clean and dry. May remove the outer dressing in 2 days keep the Steri-Strips on and intact.

## 2013-08-20 NOTE — Progress Notes (Signed)
Patient ID: Emily Steele, female   DOB: [birth year], 57 y.o.   MRN: [id number] Doing very well pain well controlled we'll plan discharge home  Neurologically stable

## 2013-08-20 NOTE — Progress Notes (Signed)
Patient d/c home in good condtion

## 2013-08-20 NOTE — Telephone Encounter (Signed)
Dr Saintclair Halsted at [facility] prescribed Valtrex for her yesterday. Pt is all set.

## 2013-08-20 NOTE — Progress Notes (Addendum)
Occupational Therapy Treatment Patient Details Name: Emily Steele MRN: [id number] DOB: 08-06-1955 Today's Date: 08/20/2013    History of present illness 58 y.o. female admitted to [facility] on 08/18/13 for elective L4/5 max access PLIF.  She has significant PMHx of anxiety, fibromyalgia, R temoral brain tumor (not CA, per pt leaving her with memory deficits), and bil thumb surgery.     OT comments  Pt progressing. A lot of questions covered in session and education provided.    Follow Up Recommendations  Home health OT;Supervision - Intermittent    Equipment Recommendations  3 in 1 bedside comode    Recommendations for Other Services      Precautions / Restrictions Precautions Precautions: Back;Fall Precaution Booklet Issued: Yes (comment) Precaution Comments: Discussed precautions Required Braces or Orthoses: Spinal Brace Spinal Brace: Lumbar corset;Applied in sitting position Restrictions Weight Bearing Restrictions: No       Mobility Bed Mobility               General bed mobility comments: pt up in chair  Transfers Overall transfer level: Needs assistance Equipment used: Rolling walker (2 wheeled) Transfers: Sit to/from Stand Sit to Stand: Supervision;Min guard         General transfer comment: cues for technique for toilet transfer    Balance                                   ADL Overall ADL's : Needs assistance/impaired     Grooming: Oral care;Set up;Supervision/safety;Standing               Lower Body Dressing: Set up;Supervision/safety;Sit to/from stand   Toilet Transfer: Supervision/safety;Ambulation;RW;Comfort height toilet;Grab bars       Tub/ Shower Transfer: Min guard;Ambulation;Rolling walker   Functional mobility during ADLs: Supervision/safety;Rolling walker General ADL Comments: Reviewed information with pt (use of cup, placement of grooming items). Educated on safe shoewear. Recommended sitting for bathing. Pt asking  a lot of questions-discussed laundry, feeding pet, position of pillows, car transfer. Recommended someone being with pt if she steps over tub. Also, recommended having towel under chair for shower if she uses a lawn/fold down chair. Educated on different tub transfer techniques (pt practiced stepping over). Cues for precautions during session. Discussed toilet aid for hygiene.      Vision                     Perception     Praxis      Cognition   Behavior During Therapy: Anxious Overall Cognitive Status: History of cognitive impairments - at baseline       Memory: Decreased short-term memory               Extremity/Trunk Assessment               Exercises     Shoulder Instructions       General Comments      Pertinent Vitals/ Pain       Pain 2/10. Nurse notified.   Home Living                                          Prior Functioning/Environment              Frequency Min 3X/week     Progress Toward Goals  OT Goals(current goals can now be found in the care plan section)  Progress towards OT goals: Progressing toward goals  Acute Rehab OT Goals Patient Stated Goal: not stated OT Goal Formulation: With patient Time For Goal Achievement: 08/25/13 Potential to Achieve Goals: Good ADL Goals Pt Will Perform Grooming: with modified independence;standing Pt Will Perform Lower Body Bathing: with modified independence;sit to/from stand Pt Will Perform Lower Body Dressing: with modified independence;sit to/from stand Pt Will Transfer to Toilet: with modified independence;ambulating;grab bars (elevated toilet) Pt Will Perform Toileting - Clothing Manipulation and hygiene: with modified independence;sit to/from stand Additional ADL Goal #1: Pt will independently verbalize and demonstrate 3/3 back precautions.  Additional ADL Goal #2: Pt will don/doff back brace independently.  Plan Discharge plan remains appropriate     Co-evaluation                 End of Session Equipment Utilized During Treatment: Rolling walker;Back brace   Activity Tolerance Patient tolerated treatment well   Patient Left in chair;with call bell/phone within reach;with nursing/sitter in room   Nurse Communication          Time: 0912-0959 OT Time Calculation (min): 47 min  Charges: OT General Charges $OT Visit: 1 Procedure OT Treatments $Self Care/Home Management : 38-52 mins  Benito Mccreedy OTR/L [phone] 08/20/2013, 10:26 AM

## 2013-08-20 NOTE — Discharge Summary (Signed)
Physician Discharge Summary  Patient ID: Emily Steele MRN: [id number] DOB/AGE: 57-Aug-1957 57 y.o.  Admit date: 08/18/2013 Discharge date: 08/20/2013  Admission Diagnoses: Grade 1 spondylolisthesis and lumbar spinal stenosis L4-5  Discharge Diagnoses: Same Active Problems:   Spondylolisthesis at L4-L5 level   Discharged Condition: good  Hospital Course: Patient was admitted to the hospital underwent a decompression stabilization procedure at L4-5. Postoperative very very well recovered in the floor on the floor she was angling and voiding spontaneously tolerating her diet will be discharged home scheduled followup approximately 1-2 weeks. She'll be discharged home health physical therapy as well as a nurse and equipment.  Consults: Significant Diagnostic Studies: Treatments: Decompression stabilization L4-5 Discharge Exam: Blood pressure 107/64, pulse 81, temperature 98.8 F (37.1 C), temperature source Oral, resp. rate 20, height 5' (1.524 m), weight 81.647 kg (180 lb), SpO2 100.00%. Strength out of 5 wound clean and dry  Disposition: Home   Future Appointments Provider Department Dept Phone   09/15/2013 3:30 PM Star Age, MD [facility] [phone]       Medication List         amphetamine-dextroamphetamine 30 MG 24 hr capsule  Commonly known as:  ADDERALL XR  Take 30 mg by mouth 2 (two) times daily before a meal.     ampicillin 500 MG capsule  Commonly known as:  PRINCIPEN  Take 500 mg by mouth 2 (two) times daily as needed.     buPROPion 200 MG 12 hr tablet  Commonly known as:  WELLBUTRIN SR  Take 200 mg by mouth 2 (two) times daily.     clindamycin 1 % external solution  Commonly known as:  CLEOCIN T  Apply 1 application topically 2 (two) times daily as needed (dermatits).     diclofenac 1.3 % Ptch  Commonly known as:  FLECTOR  Place 1 patch onto the skin 2 (two) times daily.     docusate sodium 100 MG capsule  Commonly known as:   COLACE  Take 100 mg by mouth at bedtime as needed for mild constipation.     DULoxetine 60 MG capsule  Commonly known as:  CYMBALTA  Take 60 mg by mouth 2 (two) times daily.     gabapentin 300 MG capsule  Commonly known as:  NEURONTIN  Take 300 mg by mouth 3 (three) times daily.     ibuprofen 200 MG tablet  Commonly known as:  ADVIL,MOTRIN  Take 800 mg by mouth every 6 (six) hours as needed for moderate pain.     levothyroxine 50 MCG tablet  Commonly known as:  SYNTHROID, LEVOTHROID  Take 1 tablet (50 mcg total) by mouth daily.     metaxalone 800 MG tablet  Commonly known as:  SKELAXIN  Take 800 mg by mouth 3 (three) times daily.     oxyCODONE-acetaminophen 5-325 MG per tablet  Commonly known as:  PERCOCET/ROXICET  Take 2 tablets by mouth every 6 (six) hours as needed for severe pain.     ranitidine 150 MG capsule  Commonly known as:  ZANTAC  150 mg 2 (two) times daily as needed for heartburn (only takes when taking Advil).     senna-docusate 8.6-50 MG per tablet  Commonly known as:  Senokot-S  Take 2 tablets by mouth at bedtime as needed for mild constipation.     temazepam 15 MG capsule  Commonly known as:  RESTORIL  Take 30-45 mg by mouth at bedtime. 2 tablets one day, next day 3 tabs, then  2 tabs again. Alternate between 2-3 tabs at bedtime.           Follow-up Information   Follow up with [facility], MD.   Specialty:  Neurosurgery   Contact information:   [address] [place] [address] [phone]       Signed: Elaina Hoops 08/20/2013, 11:20 AM

## 2013-08-29 ENCOUNTER — Telehealth: Payer: Self-pay | Admitting: Neurology

## 2013-08-29 NOTE — Telephone Encounter (Signed)
Called patient regarding rescheduling 09/15/13 appointment per Dr. Guadelupe Sabin schedule, patient wants to let Dr. Rexene Alberts know that she had spinal surgery on 08/18/13 and that she also has mouth sores from the tubes that they put in her mouth.

## 2013-09-03 ENCOUNTER — Telehealth: Payer: Self-pay

## 2013-09-03 NOTE — Telephone Encounter (Signed)
Pt needs most recent lab results sent in.  Says she dropped off an authorization several weeks ago and the other doctor has not received anything from [place].  Needs info sent to Dr. Estanislado Pandy at [facility].  Phone is [phone] and fax is [phone].  Please call [place] at [phone] once this has been done.  Says this doctor will not refill her medicine until this is done.

## 2013-09-04 NOTE — Telephone Encounter (Signed)
Called pt back, advised pt of her appt change and inform pt that if she has any other problems, questions or concerns to call the office. Pt verbalized understanding.

## 2013-09-04 NOTE — Telephone Encounter (Signed)
Called pt and left message stating that I was calling to see how the pt was doing and if the pt needed anything to give a call back at the office. Pt called to make Dr. Rexene Alberts aware that pt had a spinal surgery on 08/18/13 and that pt has mouth sores from the tubes that they put in pt's mouth. Pt has been resch to 01/27/14 with Dr. Rexene Alberts. FYI

## 2013-09-04 NOTE — Telephone Encounter (Signed)
Patient returning call to [facility], please call and advise patient.

## 2013-09-04 NOTE — Telephone Encounter (Signed)
Records were sent on July 02, 2013 to Dr. Arlean Hopping office. Records have been refaxed with confirmation this morning. Left message for patient.

## 2013-09-15 ENCOUNTER — Ambulatory Visit: Payer: BC Managed Care – PPO | Admitting: Neurology

## 2013-09-19 ENCOUNTER — Ambulatory Visit: Payer: BC Managed Care – PPO | Admitting: Family Medicine

## 2013-10-09 ENCOUNTER — Ambulatory Visit: Payer: BC Managed Care – PPO | Admitting: Family Medicine

## 2013-10-31 ENCOUNTER — Encounter: Payer: Self-pay | Admitting: Family Medicine

## 2013-10-31 ENCOUNTER — Ambulatory Visit (INDEPENDENT_AMBULATORY_CARE_PROVIDER_SITE_OTHER): Payer: BC Managed Care – PPO | Admitting: Family Medicine

## 2013-10-31 VITALS — BP 158/100 | HR 82 | Temp 98.3°F | Resp 16 | Ht 58.5 in | Wt 172.4 lb

## 2013-10-31 DIAGNOSIS — F329 Major depressive disorder, single episode, unspecified: Secondary | ICD-10-CM

## 2013-10-31 DIAGNOSIS — F32A Depression, unspecified: Secondary | ICD-10-CM

## 2013-10-31 DIAGNOSIS — R110 Nausea: Secondary | ICD-10-CM

## 2013-10-31 DIAGNOSIS — F3289 Other specified depressive episodes: Secondary | ICD-10-CM

## 2013-10-31 DIAGNOSIS — E039 Hypothyroidism, unspecified: Secondary | ICD-10-CM

## 2013-10-31 DIAGNOSIS — B009 Herpesviral infection, unspecified: Secondary | ICD-10-CM

## 2013-10-31 LAB — COMPLETE METABOLIC PANEL WITH GFR
ALK PHOS: 103 U/L (ref 39–117)
ALT: 15 U/L (ref 0–35)
AST: 17 U/L (ref 0–37)
Albumin: 4 g/dL (ref 3.5–5.2)
BILIRUBIN TOTAL: 0.3 mg/dL (ref 0.2–1.2)
BUN: 14 mg/dL (ref 6–23)
CO2: 25 mEq/L (ref 19–32)
Calcium: 9.1 mg/dL (ref 8.4–10.5)
Chloride: 105 mEq/L (ref 96–112)
Creat: 0.65 mg/dL (ref 0.50–1.10)
GFR, Est African American: >89 mL/min
Glucose, Bld: 89 mg/dL (ref 70–99)
Potassium: 4.4 mEq/L (ref 3.5–5.3)
Sodium: 137 mEq/L (ref 135–145)
Total Protein: 6.3 g/dL (ref 6.0–8.3)

## 2013-10-31 LAB — CBC WITH DIFFERENTIAL/PLATELET
BASOS ABS: 0 10*3/uL (ref 0.0–0.1)
Basophils Relative: 1 % (ref 0–1)
EOS PCT: 3 % (ref 0–5)
Eosinophils Absolute: 0.1 10*3/uL (ref 0.0–0.7)
HCT: 32.4 % — ABNORMAL LOW (ref 36.0–46.0)
Hemoglobin: 10 g/dL — ABNORMAL LOW (ref 12.0–15.0)
Lymphocytes Relative: 28 % (ref 12–46)
Lymphs Abs: 1.1 10*3/uL (ref 0.7–4.0)
MCH: 21.4 pg — ABNORMAL LOW (ref 26.0–34.0)
MCHC: 30.9 g/dL (ref 30.0–36.0)
MCV: 69.4 fL — AB (ref 78.0–100.0)
MONO ABS: 0.3 10*3/uL (ref 0.1–1.0)
Monocytes Relative: 7 % (ref 3–12)
Neutro Abs: 2.5 10*3/uL (ref 1.7–7.7)
Neutrophils Relative %: 61 % (ref 43–77)
Platelets: 300 10*3/uL (ref 150–400)
RBC: 4.67 MIL/uL (ref 3.87–5.11)
RDW: 18.2 % — AB (ref 11.5–15.5)
WBC: 4.1 10*3/uL (ref 4.0–10.5)

## 2013-10-31 MED ORDER — VALACYCLOVIR HCL 500 MG PO TABS: ORAL_TABLET | ORAL | Status: DC

## 2013-10-31 NOTE — Patient Instructions (Addendum)
Nausea, Adult Nausea means you feel sick to your stomach or need to throw up (vomit). It may be a sign of a more serious problem. If nausea gets worse, you may throw up. If you throw up a lot, you may lose too much body fluid (dehydration). HOME CARE   Get plenty of rest.  Ask your doctor how to replace body fluid losses (rehydrate).  Eat small amounts of food. Sip liquids more often.  Take all medicines as told by your doctor. GET HELP RIGHT AWAY IF:  You have a fever.  You pass out (faint).  You keep throwing up or have blood in your throw up.  You are very weak, have dry lips or a dry mouth, or you are very thirsty (dehydrated).  You have dark or bloody poop (stool).  You have very bad chest or belly (abdominal) pain.  You do not get better after 2 days, or you get worse.  You have a headache. MAKE SURE YOU:  Understand these instructions.  Will watch your condition.  Will get help right away if you are not doing well or get worse. Document Released: 03/23/2011 Document Revised: 06/26/2011 Document Reviewed: 03/23/2011 [facility] Patient Information 2015 [place], [place]. This information is not intended to replace advice given to you by your health care provider. Make sure you discuss any questions you have with your health care provider.    Food Choices to Help Relieve Diarrhea When you have diarrhea, the foods you eat and your eating habits are very important. Choosing the right foods and drinks can help relieve diarrhea. Also, because diarrhea can last up to 7 days, you need to replace lost fluids and electrolytes (such as sodium, potassium, and chloride) in order to help prevent dehydration.  WHAT GENERAL GUIDELINES DO I NEED TO FOLLOW?  Slowly drink 1 cup (8 oz) of fluid for each episode of diarrhea. If you are getting enough fluid, your urine will be clear or pale yellow.  Eat starchy foods. Some good choices include white rice, white toast, pasta, low-fiber  cereal, baked potatoes (without the skin), saltine crackers, and bagels.  Avoid large servings of any cooked vegetables.  Limit fruit to two servings per day. A serving is  cup or 1 small piece.  Choose foods with less than 2 g of fiber per serving.  Limit fats to less than 8 tsp (38 g) per day.  Avoid fried foods.  Eat foods that have probiotics in them. Probiotics can be found in certain dairy products.  Avoid foods and beverages that may increase the speed at which food moves through the stomach and intestines (gastrointestinal tract). Things to avoid include:  High-fiber foods, such as dried fruit, raw fruits and vegetables, nuts, seeds, and whole grain foods.  Spicy foods and high-fat foods.  Foods and beverages sweetened with high-fructose corn syrup, honey, or sugar alcohols such as xylitol, sorbitol, and mannitol. WHAT FOODS ARE RECOMMENDED? Grains White rice. White, [place], or pita breads (fresh or toasted), including plain rolls, buns, or bagels. White pasta. Saltine, soda, or graham crackers. Pretzels. Low-fiber cereal. Cooked cereals made with water (such as cornmeal, farina, or cream cereals). Plain muffins. Matzo. Melba toast. Zwieback.  Vegetables Potatoes (without the skin). Strained tomato and vegetable juices. Most well-cooked and canned vegetables without seeds. Tender lettuce. Fruits Cooked or canned applesauce, apricots, cherries, fruit cocktail, grapefruit, peaches, pears, or plums. Fresh bananas, apples without skin, cherries, grapes, cantaloupe, grapefruit, peaches, oranges, or plums.  Meat and Other Protein  Products Baked or boiled chicken. Eggs. Tofu. Fish. Seafood. Smooth peanut butter. Ground or well-cooked tender beef, ham, veal, lamb, pork, or poultry.  Dairy Plain yogurt, kefir, and unsweetened liquid yogurt. Lactose-free milk, buttermilk, or soy milk. Plain hard cheese. Beverages Sport drinks. Clear broths. Diluted fruit juices (except prune).  Regular, caffeine-free sodas such as ginger ale. Water. Decaffeinated teas. Oral rehydration solutions. Sugar-free beverages not sweetened with sugar alcohols. Other Bouillon, broth, or soups made from recommended foods.  The items listed above may not be a complete list of recommended foods or beverages. Contact your dietitian for more options. WHAT FOODS ARE NOT RECOMMENDED? Grains Whole grain, whole wheat, bran, or rye breads, rolls, pastas, crackers, and cereals. Wild or brown rice. Cereals that contain more than 2 g of fiber per serving. Corn tortillas or taco shells. Cooked or dry oatmeal. Granola. Popcorn. Vegetables Raw vegetables. Cabbage, broccoli, Brussels sprouts, artichokes, baked beans, beet greens, corn, kale, legumes, peas, sweet potatoes, and yams. Potato skins. Cooked spinach and cabbage. Fruits Dried fruit, including raisins and dates. Raw fruits. Stewed or dried prunes. Fresh apples with skin, apricots, mangoes, pears, raspberries, and strawberries.  Meat and Other Protein Products Chunky peanut butter. Nuts and seeds. Beans and lentils. Berniece Salines.  Dairy High-fat cheeses. Milk, chocolate milk, and beverages made with milk, such as milk shakes. Cream. Ice cream. Sweets and Desserts Sweet rolls, doughnuts, and sweet breads. Pancakes and waffles. Fats and Oils Butter. Cream sauces. Margarine. Salad oils. Plain salad dressings. Olives. Avocados.  Beverages Caffeinated beverages (such as coffee, tea, soda, or energy drinks). Alcoholic beverages. Fruit juices with pulp. Prune juice. Soft drinks sweetened with high-fructose corn syrup or sugar alcohols. Other Coconut. Hot sauce. Chili powder. Mayonnaise. Gravy. Cream-based or milk-based soups.  The items listed above may not be a complete list of foods and beverages to avoid. Contact your dietitian for more information. WHAT SHOULD I DO IF I BECOME DEHYDRATED? Diarrhea can sometimes lead to dehydration. Signs of dehydration include  dark urine and dry mouth and skin. If you think you are dehydrated, you should rehydrate with an oral rehydration solution. These solutions can be purchased at pharmacies, retail stores, or online.  Drink -1 cup (120-240 mL) of oral rehydration solution each time you have an episode of diarrhea. If drinking this amount makes your diarrhea worse, try drinking smaller amounts more often. For example, drink 1-3 tsp (5-15 mL) every 5-10 minutes.  A general rule for staying hydrated is to drink 1-2 L of fluid per day. Talk to your health care provider about the specific amount you should be drinking each day. Drink enough fluids to keep your urine clear or pale yellow. Document Released: 06/24/2003 Document Revised: 04/08/2013 Document Reviewed: 02/24/2013 [facility] Patient Information 2015 [place], [place]. This information is not intended to replace advice given to you by your health care provider. Make sure you discuss any questions you have with your health care provider.   If you want to continue your smoothies, try using coconut water and almond milk instead of yogurt and cow milk.  You can have any liquids that you can see through. Continue drinking warm ginger ale.   Try to avoid most, if not all, dairy products.

## 2013-10-31 NOTE — Progress Notes (Signed)
Subjective:    Patient ID: Emily Steele, female    DOB: [birth year], 58 y.o.   MRN: [id number]  Emesis  Associated symptoms include diarrhea and headaches. Pertinent negatives include no abdominal pain, chills, dizziness or fever.  Diarrhea  Associated symptoms include headaches and vomiting. Pertinent negatives include no abdominal pain, chills or fever.     This 58 y.o. Cauc female presents today w/ 2-week hx of nausea and 1 episode of vomiting 4 days ago. She has little appetite. Her mother died in late 2022-09-13 and her dog died in 14-Oct-2022. She is still recovering from back surgery performed by Dr. Saintclair Halsted on Aug 18, 2013; pt states she has less pain since procedure. She c/o generalized body aches; a back brace provides support.  Sleep hygiene is poor as pt sleeps in a recliner; her bed is uncomfortable.  Pt also has mild diarrhea x 2 but consuming less dairy has decreased this symptom. Diet consists of liquids, warm ginger ale and limited fruits. She denies fever/chills or abd pain. Pt has a chronic HA disorder and fibromyalgia. 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 She is concerned about a recurrence as she is having mental "fogginess" and decreased memory recently. She feels more depressed; her mental health provider increased her anti-depressant. She reports compliance w/ all medications.  Pt reports Dr. Saintclair Halsted repeated MRI- Brain which shows meningioma (thought to be benign) "has gotten a little bigger".   Patient Active Problem List   Diagnosis Date Noted  . Spondylolisthesis at L4-L5 level 08/18/2013  . Altered mental status 08/17/2012  . 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 CAP (community acquired pneumonia) 08/17/2012  . Degenerative disc disease 08/13/2011  . H/O vitamin D deficiency 08/13/2011  . Major Depressive Disorder 05/17/2011  . Fibromyalgia 05/17/2011  . Hypothyroidism 05/17/2011  . Sleep behavior disorder, REM 05/17/2011  .  Osteopenia 05/17/2011  . Obesity 05/17/2011  . Plantar fasciitis 05/17/2011    Prior to Admission medications   Medication Sig Start Date End Date Taking? Authorizing Provider  amphetamine-dextroamphetamine (ADDERALL XR) 30 MG 24 hr capsule Take 30 mg by mouth 2 (two) times daily before a meal.    Yes Historical Provider, MD  ampicillin (PRINCIPEN) 500 MG capsule Take 500 mg by mouth 2 (two) times daily as needed.   Yes Historical Provider, MD  buPROPion (WELLBUTRIN SR) 200 MG 12 hr tablet Take 200 mg by mouth 2 (two) times daily.   Yes Historical Provider, MD  clindamycin (CLEOCIN T) 1 % external solution Apply 1 application topically 2 (two) times daily as needed (dermatits).    Yes Historical Provider, MD  diclofenac (FLECTOR) 1.3 % PTCH Place 1 patch onto the skin 2 (two) times daily.   Yes Historical Provider, MD  docusate sodium (COLACE) 100 MG capsule Take 100 mg by mouth at bedtime as needed for mild constipation.   Yes Historical Provider, MD  DULoxetine (CYMBALTA) 60 MG capsule Take 60 mg by mouth 2 (two) times daily. 03/13/11  Yes Historical Provider, MD  gabapentin (NEURONTIN) 300 MG capsule Take 300 mg by mouth 3 (three) times daily.   Yes Historical Provider, MD  ibuprofen (ADVIL,MOTRIN) 200 MG tablet Take 800 mg by mouth every 6 (six) hours as needed for moderate pain.   Yes Historical Provider, MD  levothyroxine (SYNTHROID, LEVOTHROID) 50 MCG tablet Take 1 tablet (50 mcg total) by mouth daily. 04/30/13  Yes Barton Fanny, MD  metaxalone (SKELAXIN) 800 MG tablet Take 800 mg by mouth 3 (three) times daily.   Yes Historical Provider, MD  oxyCODONE-acetaminophen (PERCOCET/ROXICET) 5-325 MG per tablet Take 2 tablets by mouth every 6 (six) hours as needed for severe pain.   Yes Historical Provider, MD  ranitidine (ZANTAC) 150 MG capsule 150 mg 2 (two) times daily as needed for heartburn (only takes when taking Advil).    Yes Historical Provider, MD  senna-docusate (SENOKOT-S)  8.6-50 MG per tablet Take 2 tablets by mouth at bedtime as needed for mild constipation.    Yes Historical Provider, MD  temazepam (RESTORIL) 15 MG capsule Take 30-45 mg by mouth at bedtime. 2 tablets one day, next day 3 tabs, then 2 tabs again. Alternate between 2-3 tabs at bedtime.   Yes Historical Provider, MD    PMHx, Surg Hx, Soc and Fam Hx reviewed.  Review of Systems  Constitutional: Positive for activity change, appetite change, fatigue and unexpected weight change. Negative for fever, chills and diaphoresis.  HENT: Negative for dental problem, facial swelling, mouth sores, sore throat and trouble swallowing.   Eyes: Negative.   Respiratory: Negative.   Cardiovascular: Negative.   Gastrointestinal: Positive for nausea, vomiting and diarrhea. Negative for abdominal pain, constipation and blood in stool.  Genitourinary: Negative.   Musculoskeletal: Positive for back pain.  Skin: Negative for pallor.  Neurological: Positive for headaches. Negative for dizziness, syncope, speech difficulty, weakness, light-headedness and numbness.  Psychiatric/Behavioral: Positive for confusion, sleep disturbance, dysphoric mood and decreased concentration. Negative for behavioral problems and self-injury. The patient is not nervous/anxious.         Objective:   Physical Exam  Nursing note and vitals reviewed. Constitutional: She is oriented to person, place, and time. She appears well-developed and well-nourished. No distress.  Weight down 13 lbs  HENT:  Head: Normocephalic and atraumatic.  Right Ear: External ear normal.  Left Ear: External ear normal.  Mouth/Throat: Oropharynx is clear and moist.  R nares- small scabbed ulcerated lesion.  Eyes: Conjunctivae and EOM are normal. Pupils are equal, round, and reactive to light. Right eye exhibits no discharge. Left eye exhibits discharge. No scleral icterus.  Neck: Normal range of motion. Neck supple.  Cardiovascular: Normal rate, regular rhythm  and normal heart sounds.   Pulmonary/Chest: Effort normal. No respiratory distress.  Musculoskeletal: Normal range of motion. She exhibits no edema.  Neurological: She is alert and oriented to person, place, and time. No cranial nerve deficit. She exhibits normal muscle tone. Coordination normal.  Skin: Skin is warm and dry. No rash noted. She is not diaphoretic. No pallor.  Psychiatric: Her speech is normal. Judgment and thought content normal. Her affect is blunt. Her affect is not angry, not labile and not inappropriate. She is slowed. She is not agitated. Cognition and memory are normal. She exhibits a depressed mood. She is attentive.      Assessment & Plan:  Nausea alone - Dietary modifications discussed. Plan: CBC with Differential, COMPLETE METABOLIC PANEL WITH GFR, Thyroid Panel With TSH  Recurrent nongenital herpes simplex virus (HSV) infection - Will treat empirically w/ Valtrex 500 mg 1 tab bid. Side effects profile reviewed w/ pt. Plan: CBC with Differential, COMPLETE METABOLIC PANEL WITH GFR, Thyroid Panel With TSH  Unspecified hypothyroidism - Plan: CBC with Differential, COMPLETE METABOLIC PANEL WITH GFR, Thyroid Panel With TSH  Major Depressive Disorder - Plan: CBC with Differential, COMPLETE METABOLIC PANEL WITH GFR, Thyroid Panel With TSH  Meds ordered this encounter  Medications  .  valACYclovir (VALTREX) 500 MG tablet    Sig: Take 1 tablet twice a day.    Dispense:  60 tablet    Refill:  1

## 2013-11-01 LAB — THYROID PANEL WITH TSH
FREE THYROXINE INDEX: 2.8 (ref 1.0–3.9)
T3 UPTAKE: 28.9 % (ref 22.5–37.0)
T4 TOTAL: 9.7 ug/dL (ref 5.0–12.5)
TSH: 4.935 u[IU]/mL — AB (ref 0.350–4.500)

## 2013-11-06 ENCOUNTER — Other Ambulatory Visit: Payer: Self-pay

## 2013-11-06 NOTE — Telephone Encounter (Signed)
[facility] mail order faxed req for RF of clindamycin top sol. Dr Leward Quan you have seen this pt recently but doesn't look like you've Rxd this for pt for some time. Do you want to RF or pt RTC?

## 2013-11-07 MED ORDER — CLINDAMYCIN PHOSPHATE 1 % EX SOLN: 1.0000 "application " | Freq: Two times a day (BID) | CUTANEOUS | Status: DC | PRN

## 2013-11-07 NOTE — Telephone Encounter (Signed)
I authorized Clindamycin topical for 90 days + 1 RF.

## 2013-11-13 ENCOUNTER — Telehealth: Payer: Self-pay

## 2013-11-13 NOTE — Telephone Encounter (Signed)
Pt is needing a refill on clindamycin (CLEOCIN T) 1 % and the levothyroxine (SYNTHROID, LEVOTHROID) 50 MCG tablet [[id number]]

## 2013-11-14 MED ORDER — LEVOTHYROXINE SODIUM 50 MCG PO TABS: 50.0000 ug | ORAL_TABLET | Freq: Every day | ORAL | Status: DC

## 2013-11-14 NOTE — Telephone Encounter (Signed)
Please advise cream refill. Sent in refill on Synthroid.

## 2013-11-14 NOTE — Telephone Encounter (Signed)
Cleocin T 1%  was refilled on 11/07/2013. Pt needs to check with [facility] to see if it has been shipped.

## 2013-11-17 NOTE — Telephone Encounter (Signed)
Lm to advise pt to check with [facility]. Rtn call if any further questions.

## 2013-11-20 NOTE — Telephone Encounter (Signed)
Noted  

## 2013-11-25 ENCOUNTER — Other Ambulatory Visit: Payer: Self-pay | Admitting: Neurosurgery

## 2013-11-25 DIAGNOSIS — M4316 Spondylolisthesis, lumbar region: Secondary | ICD-10-CM

## 2013-11-27 ENCOUNTER — Encounter: Payer: Self-pay | Admitting: Family Medicine

## 2013-11-27 ENCOUNTER — Ambulatory Visit (INDEPENDENT_AMBULATORY_CARE_PROVIDER_SITE_OTHER): Payer: BC Managed Care – PPO | Admitting: Family Medicine

## 2013-11-27 VITALS — BP 118/78 | HR 73 | Temp 97.6°F | Resp 16 | Ht 58.5 in | Wt 178.4 lb

## 2013-11-27 DIAGNOSIS — E039 Hypothyroidism, unspecified: Secondary | ICD-10-CM

## 2013-11-27 DIAGNOSIS — N951 Menopausal and female climacteric states: Secondary | ICD-10-CM

## 2013-11-27 DIAGNOSIS — B009 Herpesviral infection, unspecified: Secondary | ICD-10-CM

## 2013-11-27 DIAGNOSIS — B351 Tinea unguium: Secondary | ICD-10-CM

## 2013-11-27 DIAGNOSIS — L740 Miliaria rubra: Secondary | ICD-10-CM

## 2013-11-27 MED ORDER — VALACYCLOVIR HCL 500 MG PO TABS: ORAL_TABLET | ORAL | Status: DC

## 2013-11-27 MED ORDER — CALCIUM-VIT D3-PHYTOSTEROLS 315-250-200 MG-UNIT-MG PO TABS: 1.0000 | ORAL_TABLET | Freq: Every day | ORAL | Status: DC

## 2013-11-27 MED ORDER — RANITIDINE HCL 150 MG PO CAPS: 150.0000 mg | ORAL_CAPSULE | Freq: Two times a day (BID) | ORAL | Status: DC | PRN

## 2013-11-27 MED ORDER — AMPICILLIN 500 MG PO CAPS: 500.0000 mg | ORAL_CAPSULE | Freq: Two times a day (BID) | ORAL | Status: DC | PRN

## 2013-11-27 NOTE — Patient Instructions (Signed)
FOR NAIL FUNGUS--- Try COLORLESS IODINE. You can find this at your pharmacy in a small brown bottle. Apply it to the affected nails twice a day. It may take 3 months for you to see improvement.  We can check this again when you return in 3 months.

## 2013-11-28 ENCOUNTER — Ambulatory Visit
Admission: RE | Admit: 2013-11-28 | Discharge: 2013-11-28 | Disposition: A | Discharge status: Home / Self Care | Payer: BC Managed Care – PPO | Source: Ambulatory Visit | Attending: Neurosurgery | Admitting: Neurosurgery

## 2013-11-28 DIAGNOSIS — M4316 Spondylolisthesis, lumbar region: Secondary | ICD-10-CM

## 2013-11-28 LAB — FSH/LH
FSH: 97.8 m[IU]/mL
LH: 61.8 m[IU]/mL

## 2013-12-01 ENCOUNTER — Emergency Department (HOSPITAL_COMMUNITY): Payer: BC Managed Care – PPO

## 2013-12-01 ENCOUNTER — Encounter (HOSPITAL_COMMUNITY): Payer: Self-pay | Admitting: Emergency Medicine

## 2013-12-01 ENCOUNTER — Emergency Department (HOSPITAL_COMMUNITY)
Admission: EM | Admit: 2013-12-01 | Discharge: 2013-12-01 | Disposition: A | Discharge status: Home / Self Care | Payer: BC Managed Care – PPO | Attending: Emergency Medicine | Admitting: Emergency Medicine

## 2013-12-01 DIAGNOSIS — F3289 Other specified depressive episodes: Secondary | ICD-10-CM | POA: Diagnosis not present

## 2013-12-01 DIAGNOSIS — Z8701 Personal history of pneumonia (recurrent): Secondary | ICD-10-CM | POA: Insufficient documentation

## 2013-12-01 DIAGNOSIS — Z862 Personal history of diseases of the blood and blood-forming organs and certain disorders involving the immune mechanism: Secondary | ICD-10-CM | POA: Diagnosis not present

## 2013-12-01 DIAGNOSIS — F329 Major depressive disorder, single episode, unspecified: Secondary | ICD-10-CM | POA: Diagnosis not present

## 2013-12-01 DIAGNOSIS — S0993XA Unspecified injury of face, initial encounter: Secondary | ICD-10-CM | POA: Insufficient documentation

## 2013-12-01 DIAGNOSIS — H9190 Unspecified hearing loss, unspecified ear: Secondary | ICD-10-CM | POA: Insufficient documentation

## 2013-12-01 DIAGNOSIS — S199XXA Unspecified injury of neck, initial encounter: Secondary | ICD-10-CM

## 2013-12-01 DIAGNOSIS — I10 Essential (primary) hypertension: Secondary | ICD-10-CM | POA: Diagnosis not present

## 2013-12-01 DIAGNOSIS — Z79899 Other long term (current) drug therapy: Secondary | ICD-10-CM | POA: Diagnosis not present

## 2013-12-01 DIAGNOSIS — Z87440 Personal history of urinary (tract) infections: Secondary | ICD-10-CM | POA: Insufficient documentation

## 2013-12-01 DIAGNOSIS — E669 Obesity, unspecified: Secondary | ICD-10-CM | POA: Diagnosis not present

## 2013-12-01 DIAGNOSIS — Z8619 Personal history of other infectious and parasitic diseases: Secondary | ICD-10-CM | POA: Diagnosis not present

## 2013-12-01 DIAGNOSIS — Z791 Long term (current) use of non-steroidal anti-inflammatories (NSAID): Secondary | ICD-10-CM | POA: Insufficient documentation

## 2013-12-01 DIAGNOSIS — M5430 Sciatica, unspecified side: Secondary | ICD-10-CM | POA: Insufficient documentation

## 2013-12-01 DIAGNOSIS — K219 Gastro-esophageal reflux disease without esophagitis: Secondary | ICD-10-CM | POA: Diagnosis not present

## 2013-12-01 DIAGNOSIS — IMO0002 Reserved for concepts with insufficient information to code with codable children: Secondary | ICD-10-CM | POA: Insufficient documentation

## 2013-12-01 DIAGNOSIS — M5489 Other dorsalgia: Secondary | ICD-10-CM

## 2013-12-01 DIAGNOSIS — E039 Hypothyroidism, unspecified: Secondary | ICD-10-CM | POA: Insufficient documentation

## 2013-12-01 DIAGNOSIS — Z792 Long term (current) use of antibiotics: Secondary | ICD-10-CM | POA: Diagnosis not present

## 2013-12-01 DIAGNOSIS — F411 Generalized anxiety disorder: Secondary | ICD-10-CM | POA: Insufficient documentation

## 2013-12-01 DIAGNOSIS — M1990 Unspecified osteoarthritis, unspecified site: Secondary | ICD-10-CM | POA: Diagnosis not present

## 2013-12-01 DIAGNOSIS — M5432 Sciatica, left side: Secondary | ICD-10-CM

## 2013-12-01 MED ORDER — OXYCODONE-ACETAMINOPHEN 5-325 MG PO TABS
1.0000 | ORAL_TABLET | Freq: Once | ORAL | Status: AC
  Administered 2013-12-01: 1 via ORAL
  Filled 2013-12-01: qty 1

## 2013-12-01 NOTE — ED Provider Notes (Signed)
Medical screening examination/treatment/procedure(s) were performed by non-physician practitioner and as supervising physician I was immediately available for consultation/collaboration.   EKG Interpretation None      Rolland Porter, MD, Abram Sander   Janice Norrie, MD 12/01/13 [phone]
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
## 2013-12-01 NOTE — Discharge Instructions (Signed)
Use your home pain medications for your back/neck pain. Use heat to the affected areas to help with pain. Decrease the noise and stimulation in the environment to help with decreasing concussion symptoms. Get plenty of rest. See your regular orthopedic doctor for follow up this week. Return to the ER for any changes or worsening of symptoms.   Musculoskeletal Pain Musculoskeletal pain is muscle and boney aches and pains. These pains can occur in any part of the body. Your caregiver may treat you without knowing the cause of the pain. They may treat you if blood or urine tests, X-rays, and other tests were normal.  CAUSES There is often not a definite cause or reason for these pains. These pains may be caused by a type of germ (virus). The discomfort may also come from overuse. Overuse includes working out too hard when your body is not fit. Boney aches also come from weather changes. Bone is sensitive to atmospheric pressure changes. HOME CARE INSTRUCTIONS   Ask when your test results will be ready. Make sure you get your test results.  Only take over-the-counter or prescription medicines for pain, discomfort, or fever as directed by your caregiver. If you were given medications for your condition, do not drive, operate machinery or power tools, or sign legal documents for 24 hours. Do not drink alcohol. Do not take sleeping pills or other medications that may interfere with treatment.  Continue all activities unless the activities cause more pain. When the pain lessens, slowly resume normal activities. Gradually increase the intensity and duration of the activities or exercise.  During periods of severe pain, bed rest may be helpful. Lay or sit in any position that is comfortable.  Putting ice on the injured area.  Put ice in a bag.  Place a towel between your skin and the bag.  Leave the ice on for 15 to 20 minutes, 3 to 4 times a day.  Follow up with your caregiver for continued problems  and no reason can be found for the pain. If the pain becomes worse or does not go away, it may be necessary to repeat tests or do additional testing. Your caregiver may need to look further for a possible cause. SEEK IMMEDIATE MEDICAL CARE IF:  You have pain that is getting worse and is not relieved by medications.  You develop chest pain that is associated with shortness or breath, sweating, feeling sick to your stomach (nauseous), or throw up (vomit).  Your pain becomes localized to the abdomen.  You develop any new symptoms that seem different or that concern you. MAKE SURE YOU:   Understand these instructions.  Will watch your condition.  Will get help right away if you are not doing well or get worse. Document Released: 04/03/2005 Document Revised: 06/26/2011 Document Reviewed: 12/06/2012 [facility] Patient Information 2015 [place], [place]. This information is not intended to replace advice given to you by your health care provider. Make sure you discuss any questions you have with your health care provider.  Sciatica Sciatica is pain, weakness, numbness, or tingling along the path of the sciatic nerve. The nerve starts in the lower back and runs down the back of each leg. The nerve controls the muscles in the lower leg and in the back of the knee, while also providing sensation to the back of the thigh, lower leg, and the sole of your foot. Sciatica is a symptom of another medical condition. For instance, nerve damage or certain conditions, such as a  herniated disk or bone spur on the spine, pinch or put pressure on the sciatic nerve. This causes the pain, weakness, or other sensations normally associated with sciatica. Generally, sciatica only affects one side of the body. CAUSES   Herniated or slipped disc.  Degenerative disk disease.  A pain disorder involving the narrow muscle in the buttocks (piriformis syndrome).  Pelvic injury or fracture.  Pregnancy.  Tumor  (rare). SYMPTOMS  Symptoms can vary from mild to very severe. The symptoms usually travel from the low back to the buttocks and down the back of the leg. Symptoms can include:  Mild tingling or dull aches in the lower back, leg, or hip.  Numbness in the back of the calf or sole of the foot.  Burning sensations in the lower back, leg, or hip.  Sharp pains in the lower back, leg, or hip.  Leg weakness.  Severe back pain inhibiting movement. These symptoms may get worse with coughing, sneezing, laughing, or prolonged sitting or standing. Also, being overweight may worsen symptoms. DIAGNOSIS  Your caregiver will perform a physical exam to look for common symptoms of sciatica. He or she may ask you to do certain movements or activities that would trigger sciatic nerve pain. Other tests may be performed to find the cause of the sciatica. These may include:  Blood tests.  X-rays.  Imaging tests, such as an MRI or CT scan. TREATMENT  Treatment is directed at the cause of the sciatic pain. Sometimes, treatment is not necessary and the pain and discomfort goes away on its own. If treatment is needed, your caregiver may suggest:  Over-the-counter medicines to relieve pain.  Prescription medicines, such as anti-inflammatory medicine, muscle relaxants, or narcotics.  Applying heat or ice to the painful area.  Steroid injections to lessen pain, irritation, and inflammation around the nerve.  Reducing activity during periods of pain.  Exercising and stretching to strengthen your abdomen and improve flexibility of your spine. Your caregiver may suggest losing weight if the extra weight makes the back pain worse.  Physical therapy.  Surgery to eliminate what is pressing or pinching the nerve, such as a bone spur or part of a herniated disk. HOME CARE INSTRUCTIONS   Only take over-the-counter or prescription medicines for pain or discomfort as directed by your caregiver.  Apply ice to the  affected area for 20 minutes, 3-4 times a day for the first 48-72 hours. Then try heat in the same way.  Exercise, stretch, or perform your usual activities if these do not aggravate your pain.  Attend physical therapy sessions as directed by your caregiver.  Keep all follow-up appointments as directed by your caregiver.  Do not wear high heels or shoes that do not provide proper support.  Check your mattress to see if it is too soft. A firm mattress may lessen your pain and discomfort. SEEK IMMEDIATE MEDICAL CARE IF:   You lose control of your bowel or bladder (incontinence).  You have increasing weakness in the lower back, pelvis, buttocks, or legs.  You have redness or swelling of your back.  You have a burning sensation when you urinate.  You have pain that gets worse when you lie down or awakens you at night.  Your pain is worse than you have experienced in the past.  Your pain is lasting longer than 4 weeks.  You are suddenly losing weight without reason. MAKE SURE YOU:  Understand these instructions.  Will watch your condition.  Will get help  right away if you are not doing well or get worse. °Document Released: 03/28/2001 Document Revised: 10/03/2011 Document Reviewed: 08/13/2011 °[employer]® Patient Information ©2015 [employer]. This information is not intended to replace advice given to you by your health care provider. Make sure you discuss any questions you have with your health care provider. ° °

## 2013-12-02 ENCOUNTER — Telehealth: Payer: Self-pay
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
## 2013-12-03 NOTE — Telephone Encounter (Signed)
CT results are in imaging for review. Pt says ED note is an FYI to Dr. Leward Quan.

## 2013-12-05 NOTE — Telephone Encounter (Signed)
I have reviewed ED notes and imaging.

## 2013-12-07 NOTE — Progress Notes (Signed)
S: This 58 y.o. Cauc female returns today for follow-up; she was seen on 10/31/13 w/ GI complaints including nausea, vomiting x 1, HA and diarrhea. Pt has hx of CNS HSV infection, chronic fibromyalgia and depression and hypothyroidism. Treatment at last visit included empiric oral Valtrex 500 mg 1 tab bid as well as dietary modifications. Pt reports feeling much better today w/ resolution of HA and n/v/d.  Other complaints today: - Foot rash c/w "Athlete's feet" and toenail fungus not responding to OTC topical products.  - Menopause symptoms >> pt requesting labs to check for "menopause". - Rash- rash under breast and in other areas where she perspires; pt thinks it is heat-related.   Past Medical History  Diagnosis Date  . Depression   . Fibromyalgia   . Hypothyroid   . Sleep behavior disorder, REM   . Osteopenia   . Obesity   . GERD (gastroesophageal reflux disease)   . Anemia   . Osteoporosis   . Rosacea   . UTI (urinary tract infection)   . Hx of migraine headaches   . Constipation   . Hypertension     not on medications  . Brain tumor     right temporal, not ca  . Pneumonia 08/2012  . DDD (degenerative disc disease)   . Osteoarthritis   . Toenail fungus   . Hearing loss     1 ear - mild  . Herpes simplex    Prior to Admission medications   Medication Sig Start Date End Date Taking? Authorizing Provider  amphetamine-dextroamphetamine (ADDERALL XR) 30 MG 24 hr capsule Take 30 mg by mouth 2 (two) times daily before a meal.    Yes Historical Provider, MD  buPROPion (WELLBUTRIN SR) 200 MG 12 hr tablet Take 200 mg by mouth 2 (two) times daily.   Yes Historical Provider, MD  clindamycin (CLEOCIN T) 1 % external solution Apply 1 application topically 2 (two) times daily as needed (dermatits).   Yes Barton Fanny, MD  docusate sodium (COLACE) 100 MG capsule Take 100 mg by mouth at bedtime as needed for mild constipation.   Yes Historical Provider, MD  DULoxetine (CYMBALTA) 60  MG capsule Take 60 mg by mouth 2 (two) times daily. 03/13/11  Yes Historical Provider, MD  gabapentin (NEURONTIN) 300 MG capsule Take 300 mg by mouth 3 (three) times daily.   Yes Historical Provider, MD  levothyroxine (SYNTHROID, LEVOTHROID) 50 MCG tablet Take 1 tablet (50 mcg total) by mouth daily. 11/14/13  Yes Heather M Marte, PA-C  metaxalone (SKELAXIN) 800 MG tablet Take 800 mg by mouth 3 (three) times daily.   Yes Historical Provider, MD  oxyCODONE-acetaminophen (PERCOCET/ROXICET) 5-325 MG per tablet Take 1-2 tablets by mouth every 6 (six) hours as needed for severe pain.    Yes Historical Provider, MD  ranitidine (ZANTAC) 150 MG capsule Take 1 capsule (150 mg total) by mouth 2 (two) times daily as needed for heartburn.   Yes Barton Fanny, MD  senna-docusate (SENOKOT-S) 8.6-50 MG per tablet Take 2 tablets by mouth at bedtime as needed for mild constipation.    Yes Historical Provider, MD  temazepam (RESTORIL) 15 MG capsule Take 30-45 mg by mouth at bedtime. 2 tablets one day, next day 3 tabs, then 2 tabs again. Alternate between 2-3 tabs at bedtime.   Yes Historical Provider, MD  valACYclovir (VALTREX) 500 MG tablet Take 1 tablet twice a day.   Yes Barton Fanny, MD  ampicillin (PRINCIPEN) 500 MG capsule  Take 500 mg by mouth 2 (two) times daily as needed (rosacea).    Barton Fanny, MD  tretinoin (RETIN-A) 0.05 % cream Apply 1 application topically at bedtime.    Historical Provider, MD    History   Social History  . Marital Status: Single    Spouse Name: N/A    Number of Children: 0  . Years of Education: college   Occupational History  .  Other    n/a   Social History Main Topics  . Smoking status: Never Smoker   . Smokeless tobacco: Never Used  . Alcohol Use: No  . Drug Use: No  . Sexual Activity: No   Other Topics Concern  . Not on file   Social History Narrative   Patient lives at home alone.   Caffeine Use: 3-4 cups weekly   Single. Education:  [employer].  Exercises 4x's a week.    ROS: As per HPI; negative for fever/chills, anorexia, abnormal weight change, abnormal fatigue, vision disturbances, CP or palpitations, SOB or cough, edema, abd or pelvic pain; hematochezia or melena, dizziness, seizure, tremors, weakness, agitation, abnormal behavior or dysphoric mood. Pt does have some confusion and concentration difficulty as well as mild sleep disturbance.   O: Filed Vitals:   11/27/13 1419  BP: 118/78  Pulse: 73  Temp: 97.6 F (36.4 C)  Resp: 16   GEN: in NAD; WN,WD. HENT: [place]/AT; EOMI w/ clear conj/sclerae. Otherwise unremarkable. COR: RRR. No m/g/r. No edema. LUNGS: Normal resp rate and effort. SKIN: W&D; intact. Faint pinpoint erythematous papules at waistline and under breasts. Toenail w/ slight yellow discoloration and thickness. MS: MAEs; no deformities or muscle tenderness. No c/c/e. NEURO: A&O x 3; CNs intact. Nonfocal. Very mild cognitive impairment re: attentiveness and thought content/speech pattern.  A/P: Symptoms, such as flushing, sleeplessness, headache, lack of concentration, associated with the menopause - Plan: FSH/LH  Hypothyroidism, unspecified hypothyroidism type- Check labs.  Heat rash- Conservative treatment (use Gold [place] powder to absorb moisture).  Herpes simplex without mention of complication- Continue Valtrex 500 mg 1 tab bid.  Nail fungus- [place] not covered by pt's insurer; advised OTC colorless iodine applied to affected nail bid for several weeks.  Meds ordered this encounter  Medications  . ranitidine (ZANTAC) 150 MG capsule    Sig: Take 1 capsule (150 mg total) by mouth 2 (two) times daily as needed for heartburn.    Dispense:  60 capsule    Refill:  2  . valACYclovir (VALTREX) 500 MG tablet    Sig: Take 1 tablet twice a day.    Dispense:  60 tablet    Refill:  5  . DISCONTD: ampicillin (PRINCIPEN) 500 MG capsule    Sig: Take 1 capsule (500 mg total) by mouth 2 (two) times daily  as needed.    Refill:  3

## 2013-12-25 ENCOUNTER — Ambulatory Visit (INDEPENDENT_AMBULATORY_CARE_PROVIDER_SITE_OTHER): Payer: BLUE CROSS/BLUE SHIELD | Admitting: Podiatry

## 2013-12-25 ENCOUNTER — Ambulatory Visit (INDEPENDENT_AMBULATORY_CARE_PROVIDER_SITE_OTHER): Payer: BC Managed Care – PPO

## 2013-12-25 ENCOUNTER — Encounter: Payer: Self-pay | Admitting: Podiatry

## 2013-12-25 VITALS — BP 125/78 | HR 74 | Resp 17

## 2013-12-25 DIAGNOSIS — M722 Plantar fascial fibromatosis: Secondary | ICD-10-CM

## 2013-12-25 DIAGNOSIS — R52 Pain, unspecified: Secondary | ICD-10-CM

## 2013-12-25 DIAGNOSIS — B351 Tinea unguium: Secondary | ICD-10-CM

## 2013-12-25 MED ORDER — TRIAMCINOLONE ACETONIDE 10 MG/ML IJ SUSP: 10.0000 mg | Freq: Once | INTRAMUSCULAR | Status: DC

## 2013-12-25 NOTE — Patient Instructions (Signed)

## 2013-12-25 NOTE — Progress Notes (Signed)
Subjective:     Patient ID: Emily Steele, female   DOB: 03-25-1956, 58 y.o.   MRN: [id number]  Foot Pain   patient presents with pain in her right heel mild in her right ankle and lateral foot and inability to walk distances without discomfort. States it's been present for several months   Review of Systems  All other systems reviewed and are negative.      Objective:   Physical Exam  Nursing note and vitals reviewed. Constitutional: She is oriented to person, place, and time.  Cardiovascular: Intact distal pulses.   Musculoskeletal: Normal range of motion.  Neurological: She is oriented to person, place, and time.  Skin: Skin is warm.   neurovascular status intact with muscle strength adequate and range of motion of subtalar midtarsal joint within normal limits. Patient is found to have pain when I palpated the plantar heel right and inability to walk comfortably on this area and is noted to have good digital perfusion and no equinus condition noted     Assessment:     Plantar fasciitis right with mild discomfort also extending into the ankle    Plan:     H&P and x-rays reviewed with patient. Injected the right plantar fascia 3 mg Kenalog 5 mg Xylocaine and dispensed fascially brace with instructions on usage. Placed on oral diclofenac and reappoint in the next 2 weeks and also discussed long-term new orthotics

## 2013-12-25 NOTE — Progress Notes (Signed)
   Subjective:    Patient ID: Emily Steele, female    DOB: Jul 01, 1955, 58 y.o.   MRN: [id number]  HPI Pt presents with right ankle pain, states that she thinks it PF, she gets relief through icing and wearing a boot, also has painful right toe. States that there is some swelling with the pain and it makes her unable to walk. She has a h/o PF and states that the pain has worsened over the years   Review of Systems  Constitutional: Positive for diaphoresis, appetite change and unexpected weight change.  Musculoskeletal: Positive for arthralgias and gait problem.  All other systems reviewed and are negative.      Objective:   Physical Exam        Assessment & Plan:

## 2013-12-29 ENCOUNTER — Telehealth: Payer: Self-pay | Admitting: *Deleted

## 2013-12-29 NOTE — Telephone Encounter (Signed)
I was in yesterday and got a boot.  When I came home and tried to put it on, I had some trouble following the directions.  I'm sorry about that.  I think there's a piece missing, a strap missing.  I'm wondering if I can come by, maybe Monday, and exchange it for another one.  If you could call me back I'd appreciate it.  Thank you, bye for now.  I returned her call and walked her through the process of putting the boot on.  She stated thank you very much, I will see you on Thursday.

## 2014-01-01 ENCOUNTER — Ambulatory Visit: Payer: BC Managed Care – PPO | Admitting: Podiatry

## 2014-01-12 ENCOUNTER — Ambulatory Visit: Payer: BC Managed Care – PPO | Admitting: Podiatry

## 2014-01-12 ENCOUNTER — Telehealth: Payer: Self-pay | Admitting: *Deleted

## 2014-01-12 NOTE — Telephone Encounter (Signed)
I have a question about the boot.  I haven't been able to inflate, I can't turn that knob in any direction either when I have it on or try to take it off and turn it.  I'm concerned whether that's going to make a difference to my healing.  If that's an important part of it then I probably need to exchange it before I see Dr. Paulla Dolly again.  I'll wait for your call back, thank you.  I called and informed her that it's okay to wear the foot without inflating it.  Some people actually wear it without inflating it.  She stated, "Okay great, can you transfer me to a scheduler so I can schedule an appointment?"  I transferred her to a scheduler.

## 2014-01-21 ENCOUNTER — Ambulatory Visit: Payer: BC Managed Care – PPO | Admitting: Podiatry

## 2014-01-27 ENCOUNTER — Ambulatory Visit: Payer: BC Managed Care – PPO | Admitting: Neurology

## 2014-01-27 ENCOUNTER — Telehealth: Payer: Self-pay | Admitting: Neurology

## 2014-01-27 NOTE — Telephone Encounter (Signed)
Patient no showed for an appointment today, 01/27/2014, at 1530.

## 2014-01-29 ENCOUNTER — Ambulatory Visit: Payer: BC Managed Care – PPO | Admitting: Podiatry

## 2014-02-02 ENCOUNTER — Ambulatory Visit: Payer: BC Managed Care – PPO | Admitting: Podiatry

## 2014-02-12 ENCOUNTER — Ambulatory Visit: Payer: BC Managed Care – PPO | Admitting: Podiatry

## 2014-02-17 ENCOUNTER — Ambulatory Visit: Payer: BC Managed Care – PPO | Admitting: Podiatry

## 2014-02-23 ENCOUNTER — Ambulatory Visit: Payer: BC Managed Care – PPO | Admitting: Podiatry

## 2014-02-26 ENCOUNTER — Ambulatory Visit: Payer: BC Managed Care – PPO | Admitting: Family Medicine

## 2014-03-05 ENCOUNTER — Ambulatory Visit: Payer: BC Managed Care – PPO | Admitting: Family Medicine

## 2014-03-17 ENCOUNTER — Ambulatory Visit: Payer: BC Managed Care – PPO | Admitting: Neurology

## 2014-03-17 ENCOUNTER — Telehealth: Payer: Self-pay | Admitting: Neurology

## 2014-03-17 NOTE — Telephone Encounter (Signed)
Patient no showed for an appointment today, 03/17/2014, at 1530.

## 2014-03-18 ENCOUNTER — Other Ambulatory Visit: Payer: Self-pay

## 2014-03-18 ENCOUNTER — Ambulatory Visit: Payer: BC Managed Care – PPO | Admitting: Family Medicine

## 2014-03-18 MED ORDER — LEVOTHYROXINE SODIUM 50 MCG PO TABS: 50.0000 ug | ORAL_TABLET | Freq: Every day | ORAL | Status: DC

## 2014-03-19 ENCOUNTER — Encounter: Payer: Self-pay | Admitting: Neurology

## 2014-03-19 ENCOUNTER — Telehealth: Payer: Self-pay

## 2014-03-19 ENCOUNTER — Ambulatory Visit: Payer: BC Managed Care – PPO | Admitting: Podiatry

## 2014-03-19 NOTE — Telephone Encounter (Signed)
Pt called in and states she needs a refill for Levothyroxine. She is unable to come in for an office appt at this time due to a ban on driving from her other doctor. She has an appt with them on 12/8 and the ban will probably be lifted but until then she is out and needs it. She states that no one else can bring her here. She states she will come in after the ban is lifted but would like a script now. She can be reached @[phone]. Thank you

## 2014-03-20 NOTE — Telephone Encounter (Signed)
Notified pt on VM that I had sent in RF to [place] on 03/18/14. Asked her to CB if she needs some sent locally.

## 2014-03-24 ENCOUNTER — Encounter: Payer: Self-pay | Admitting: Podiatry

## 2014-03-24 ENCOUNTER — Ambulatory Visit (INDEPENDENT_AMBULATORY_CARE_PROVIDER_SITE_OTHER): Payer: BC Managed Care – PPO | Admitting: Podiatry

## 2014-03-24 DIAGNOSIS — M779 Enthesopathy, unspecified: Secondary | ICD-10-CM

## 2014-03-24 DIAGNOSIS — M722 Plantar fascial fibromatosis: Secondary | ICD-10-CM

## 2014-03-24 MED ORDER — TRIAMCINOLONE ACETONIDE 10 MG/ML IJ SUSP
10.0000 mg | Freq: Once | INTRAMUSCULAR | Status: AC
  Administered 2014-03-24: 10 mg

## 2014-03-24 NOTE — Progress Notes (Signed)
Subjective:     Patient ID: Emily Steele, female   DOB: 06-23-1955, 58 y.o.   MRN: [id number]  HPI patient states I'm doing pretty well but my feet are flat and the pain is more in the center and the outside now states that it still hurts if she walks a lot or when she gets up in the morning or after periods of sitting   Review of Systems     Objective:   Physical Exam Neurovascular status intact with pain in the center and lateral of the right plantar fascia at its insertion into the calcaneus    Assessment:     Plantar fasciitis right center and lateral side    Plan:     Reviewed her foot structure and scanned for custom orthotics to take pressure off her feet. Injected from the lateral side 3 mg Kenalog Xylocaine and advised on reduced activity and stretching. Reappoint when orthotics are returned

## 2014-04-02 ENCOUNTER — Ambulatory Visit: Payer: BC Managed Care – PPO | Admitting: Family Medicine

## 2014-04-08 ENCOUNTER — Ambulatory Visit (INDEPENDENT_AMBULATORY_CARE_PROVIDER_SITE_OTHER): Payer: BC Managed Care – PPO | Admitting: Family Medicine

## 2014-04-08 ENCOUNTER — Encounter: Payer: Self-pay | Admitting: Family Medicine

## 2014-04-08 VITALS — BP 134/82 | HR 74 | Temp 97.7°F | Resp 16 | Ht 58.5 in | Wt 191.8 lb

## 2014-04-08 DIAGNOSIS — M623 Immobility syndrome (paraplegic): Secondary | ICD-10-CM

## 2014-04-08 DIAGNOSIS — Z79899 Other long term (current) drug therapy: Secondary | ICD-10-CM

## 2014-04-08 DIAGNOSIS — M2560 Stiffness of unspecified joint, not elsewhere classified: Secondary | ICD-10-CM

## 2014-04-08 DIAGNOSIS — D509 Iron deficiency anemia, unspecified: Secondary | ICD-10-CM

## 2014-04-08 DIAGNOSIS — E039 Hypothyroidism, unspecified: Secondary | ICD-10-CM

## 2014-04-08 DIAGNOSIS — Z23 Encounter for immunization: Secondary | ICD-10-CM

## 2014-04-08 DIAGNOSIS — Z7409 Other reduced mobility: Secondary | ICD-10-CM

## 2014-04-08 LAB — CBC
HEMATOCRIT: 33.4 % — AB (ref 36.0–46.0)
Hemoglobin: 10 g/dL — ABNORMAL LOW (ref 12.0–15.0)
MCH: 20.6 pg — ABNORMAL LOW (ref 26.0–34.0)
MCHC: 29.9 g/dL — ABNORMAL LOW (ref 30.0–36.0)
MCV: 68.7 fL — AB (ref 78.0–100.0)
MPV: 9.7 fL (ref 9.4–12.4)
Platelets: 285 10*3/uL (ref 150–400)
RBC: 4.86 MIL/uL (ref 3.87–5.11)
RDW: 20.5 % — AB (ref 11.5–15.5)
WBC: 3.8 10*3/uL — ABNORMAL LOW (ref 4.0–10.5)

## 2014-04-08 LAB — IRON: Iron: 17 ug/dL — ABNORMAL LOW (ref 42–145)

## 2014-04-08 MED ORDER — TRETINOIN 0.05 % EX CREA: 1.0000 "application " | TOPICAL_CREAM | Freq: Every day | CUTANEOUS | Status: DC

## 2014-04-08 MED ORDER — VALACYCLOVIR HCL 500 MG PO TABS: ORAL_TABLET | ORAL | Status: DC

## 2014-04-08 NOTE — Progress Notes (Signed)
Subjective:    Patient ID: Emily Steele, female    DOB: Aug 31, 1955, 58 y.o.   MRN: [id number]  HPI  This 58 y.o. Cauc female who returns for follow-up re: anemia. She denies fatigue though her activity is limited due to recent foot surgery as noted below. She denies palpitations, n/v/hemoptysis, abd pain, change in stools >> melena or hematochezia, hematuria or flank pain. No reported HA, dizziness or weakness.   Pt was hospitalized in May 2014 with acute mental status change, diagnosed w/ acute encephalitis and a meningioma was incidentally found on MRI. She had persistent HAs and mental confusion, Since starting valacyclovir in July 2015, her mental status has stabilized and she feels much improved.  Hypothyroidism- pt is compliant w/ medication and feels stable on current dose. No reported changes in skin or hair, constipation or sleep disturbances. She has joint stiffness in major joints associated with sedentary lifestyle. No joint redness or swelling or crepitus.  Plantar fasciitis since May 2015; she saw Dr. Paulla Dolly for first time in Sept 2015. She is in a CAM walker and had an ankle injection last week. She has been able to start walking around her home w/o boot. Is having some R hip pain, requiring Advil 200 mg 3-4 tablets per dose. She has been taking this on empty stomach. Has a new puppy but not able to walk but is trying to train him; he is now 66 months old.  Patient Active Problem List   Diagnosis Date Noted  . Spondylolisthesis at L4-L5 level 08/18/2013  . Altered mental status 08/17/2012  . 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 CAP (community acquired pneumonia) 08/17/2012  . Degenerative disc disease 08/13/2011  . H/O vitamin D deficiency 08/13/2011  . Major Depressive Disorder 05/17/2011  . Fibromyalgia 05/17/2011  . Hypothyroidism 05/17/2011  . Sleep behavior disorder, REM 05/17/2011  . Osteopenia 05/17/2011  . Obesity 05/17/2011  . Plantar fasciitis 05/17/2011     MEDICATIONS, PMHx, SURG Hx, SOC and FAM Hx reviewed.   Review of Systems As per HPI.     Objective:   Physical Exam  Constitutional: She is oriented to person, place, and time. She appears well-developed and well-nourished. No distress.  HENT:  Head: Normocephalic and atraumatic.  Right Ear: External ear normal.  Left Ear: External ear normal.  Nose: Nose normal.  Mouth/Throat: Oropharynx is clear and moist. No oropharyngeal exudate.  Eyes: Conjunctivae and EOM are normal. Pupils are equal, round, and reactive to light. No scleral icterus.  Neck: Normal range of motion. Neck supple.  Cardiovascular: Normal rate, regular rhythm and normal heart sounds.   Pulmonary/Chest: Effort normal and breath sounds normal. No respiratory distress.  Musculoskeletal:       Right knee: She exhibits decreased range of motion. She exhibits no swelling, no effusion and no deformity. Tenderness found.       Left knee: She exhibits decreased range of motion. She exhibits no swelling, no effusion, no deformity and normal alignment. Tenderness found.       Left ankle: She exhibits decreased range of motion and swelling. She exhibits no deformity.  R lower leg in CAM walker.  Neurological: She is alert and oriented to person, place, and time. No cranial nerve deficit. She exhibits normal muscle tone. Coordination normal.  Skin: Skin is warm and dry. No rash noted. She is not diaphoretic. No erythema. No pallor.  Psychiatric: She has a normal mood and affect. Her behavior is normal. Judgment and thought content normal.  Nursing note and vitals reviewed.      Assessment & Plan:  Anemia, iron deficiency - Plan: CBC, Iron  Hypothyroidism, unspecified hypothyroidism type - Continue current levothyroxine dose 50 mcg 1 tablet daily pending lab.  Plan: TSH  Stiffness due to immobility- Weight gain due to lack of activity; await clearance to resume ambulatory activities.  Medication management  Need for  prophylactic vaccination and inoculation against influenza - Plan: Flu Vaccine QUAD 36+ mos IM  Meds ordered this encounter  Medications  . tretinoin (RETIN-A) 0.05 % cream    Sig: Apply 1 application topically at bedtime.    Dispense:  90 g    Refill:  0  . valACYclovir (VALTREX) 500 MG tablet    Sig: Take 1 tablet twice a day.    Dispense:  180 tablet    Refill:  0

## 2014-04-08 NOTE — Patient Instructions (Addendum)
I hope you continue to get better. You will be notified about your labs by next week.  Take care and I will see you next year!

## 2014-04-09 LAB — TSH: TSH: 2.323 u[IU]/mL (ref 0.350–4.500)

## 2014-04-13 ENCOUNTER — Encounter: Payer: Self-pay | Admitting: Family Medicine

## 2014-04-13 ENCOUNTER — Other Ambulatory Visit: Payer: Self-pay | Admitting: Family Medicine

## 2014-04-13 MED ORDER — IRON POLYSACCH CMPLX-B12-FA 150-0.025-1 MG PO CAPS: ORAL_CAPSULE | ORAL | Status: DC

## 2014-04-15 ENCOUNTER — Telehealth: Payer: Self-pay

## 2014-04-15 NOTE — Telephone Encounter (Signed)
PA needed for tretinoin cream 0.05% that pt uses for Rosacea. Completed on covermymeds. Pending.

## 2014-04-16 ENCOUNTER — Telehealth: Payer: Self-pay

## 2014-04-16 NOTE — Telephone Encounter (Signed)
Pt called to check on this and I explained PA process. She will call [employer] and try to have them run Rx back through again and/or wait to hear decision.

## 2014-04-16 NOTE — Telephone Encounter (Signed)
See phone message from 04/15/14.

## 2014-04-16 NOTE — Telephone Encounter (Signed)
Pt of Dr. Leward Quan is taking tretinoin (RETIN-A) 0.05 % cream [[id number], and is needing prior authorization to bcbs.

## 2014-04-17 NOTE — Telephone Encounter (Signed)
Dr Leward Quan, the PA was denied because it is not covered for Rosacea. Denial letter stated that it is only covered for acne vulgaris, actinic keratosis or ichthyoses. Is there any basis for appeal, or do you want to Rx something else?

## 2014-04-21 MED ORDER — METRONIDAZOLE 0.75 % EX LOTN: 1.0000 "application " | TOPICAL_LOTION | Freq: Two times a day (BID) | CUTANEOUS | Status: DC

## 2014-04-21 NOTE — Telephone Encounter (Signed)
I will prescribe something else for rosacea. Topical Metronidazole is commonly used for this skin condition. It is approved by her insurer.

## 2014-04-21 NOTE — Telephone Encounter (Signed)
Notified pt who stated that she doesn't have any ins right now, but will check and see what cost OOP would be.

## 2014-05-27 IMAGING — CR DG CHEST 2V
2 series · 2 of 2 positions shown · non-contrast
Comparison: None.

CLINICAL DATA: Congestion.

CHEST - 2 VIEW

[PA]
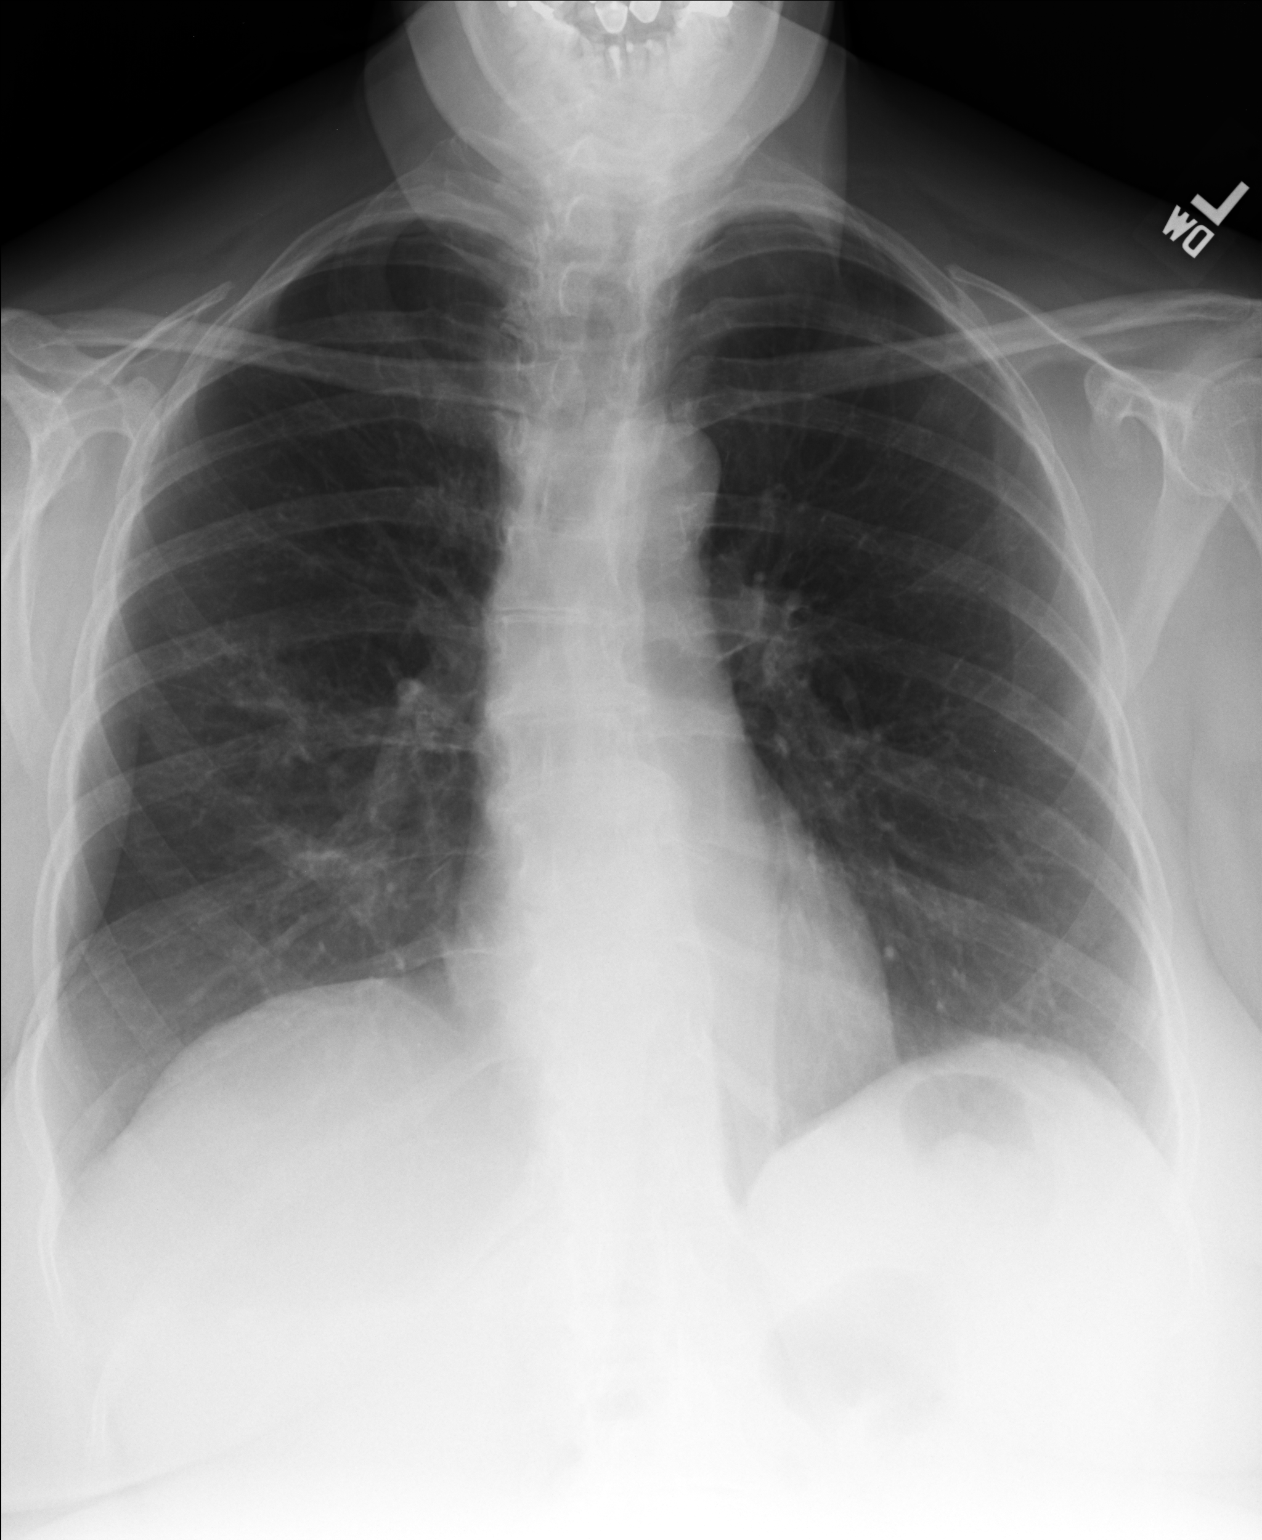

[lateral]
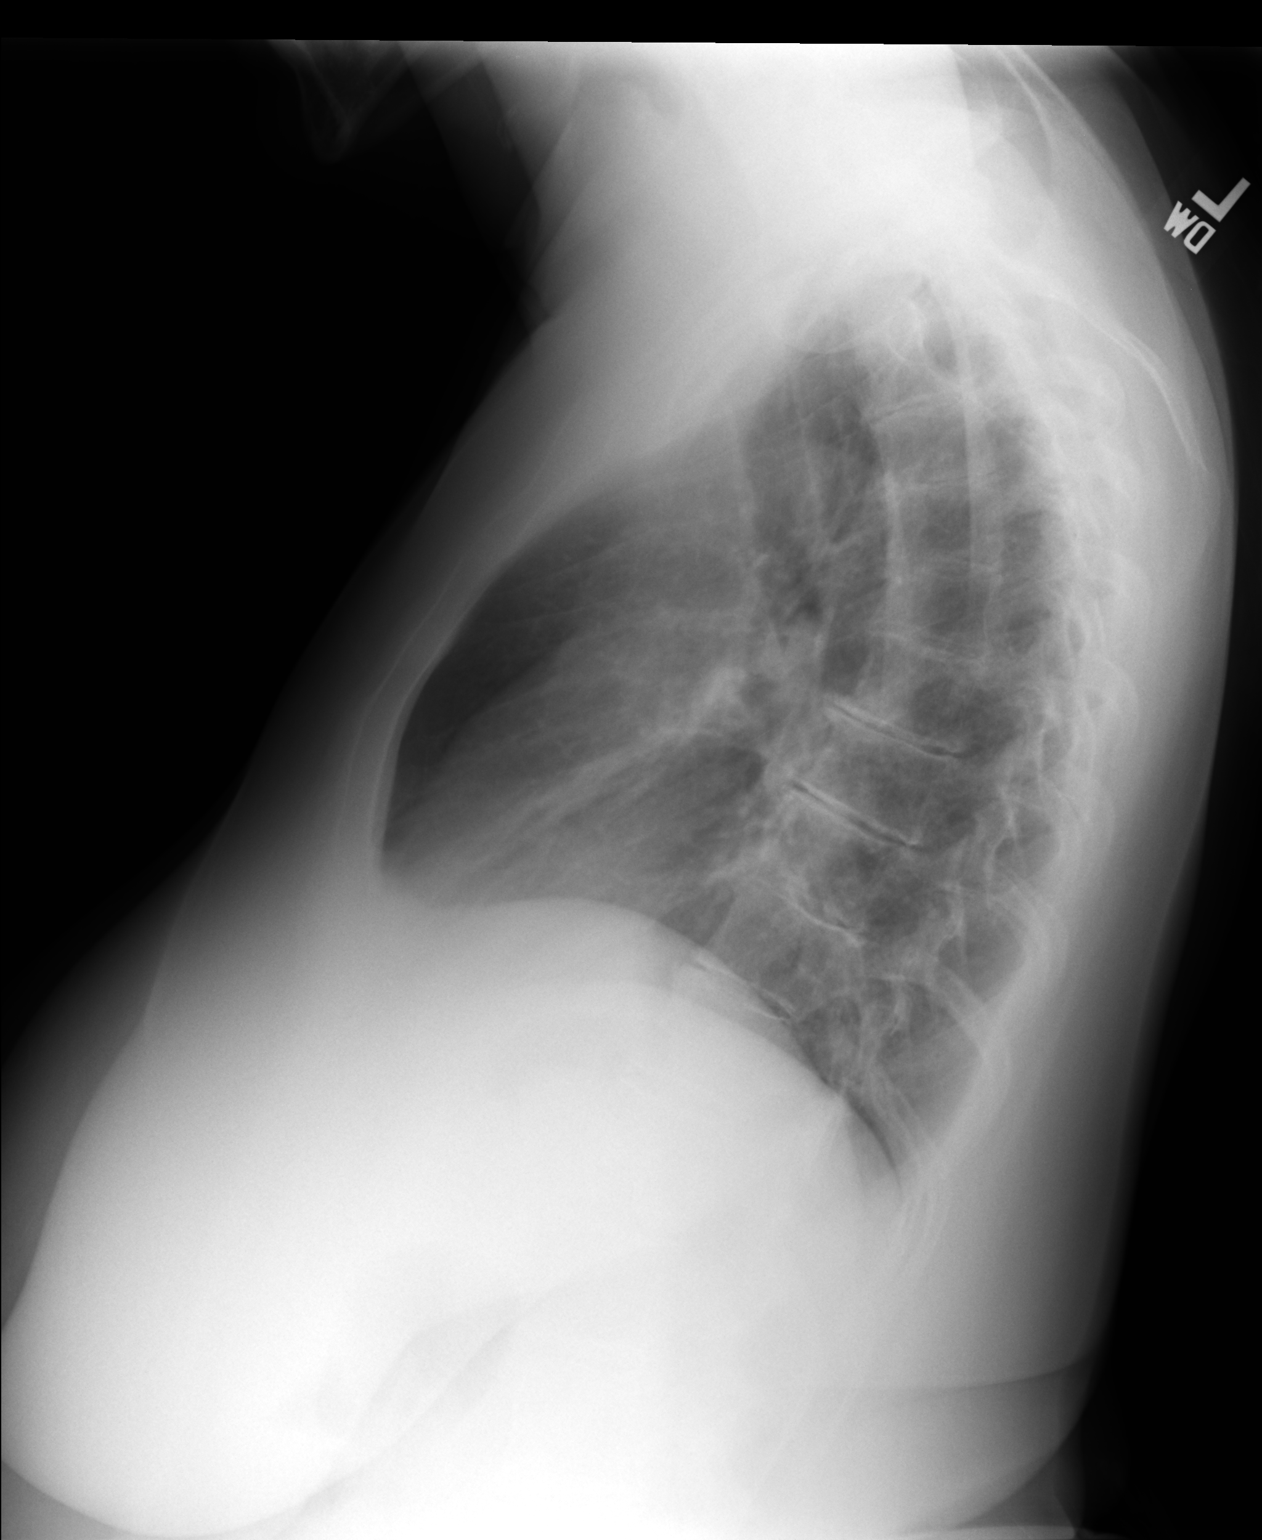

[2 of 2 positions shown; findings below may reference images not displayed]

FINDINGS: Ill-defined densities present in the right midlung.  This
could represent a small area of pneumonia, pulmonary parenchymal
scarring or be due to summation shadows/artifact.
Cardiopericardial silhouette mediastinal contours are within normal
limits.  No pleural effusion.
IMPRESSION: Faint patchy density in the right midlung which may represent
infection or scarring.  This was not noted on the preliminary
report.  Follow-up to ensure radiographic clearing recommended.
Clearing is usually observed at 8 weeks.

These results will be called to the ordering clinician or
representative by the Radiologist Assistant, and communication
documented in the PACS Dashboard.

## 2014-06-01 IMAGING — CT CT HEAD W/O CM
2 series · 15 of 30 positions shown, 19 images · non-contrast
Comparison: None.

CLINICAL DATA: Altered mental status with speech changes.

CT HEAD WITHOUT CONTRAST
TECHNIQUE: Contiguous axial images were obtained from the base of
the skull through the vertex without contrast.

[Series 2: head w/o · axial · non-contrast · 0.43mm/px · z∈[-88,+32]mm · 13 of 30 slices shown, 17 images]
[im 3/30  brain]
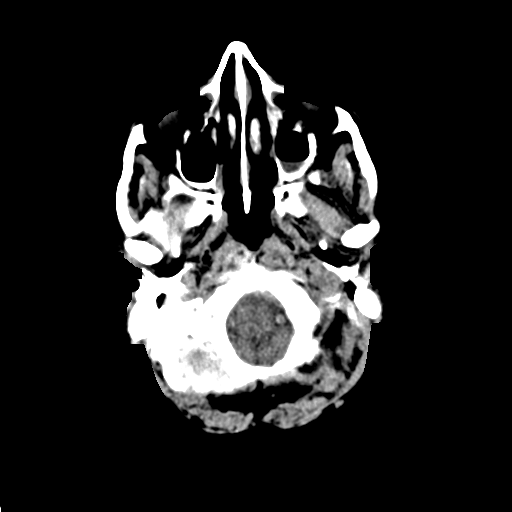
[im 3/30  bone]
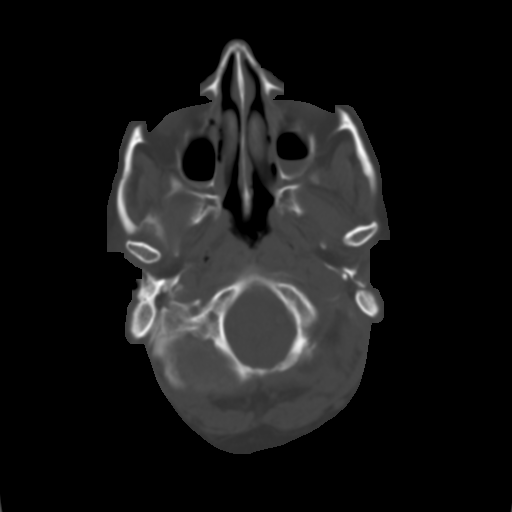
[im 5/30  brain]
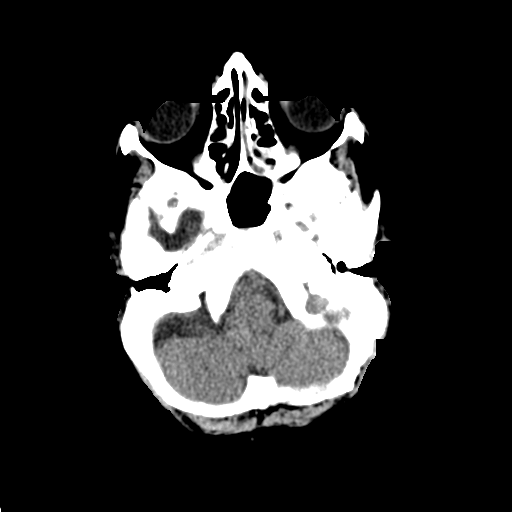
[im 7/30  brain]
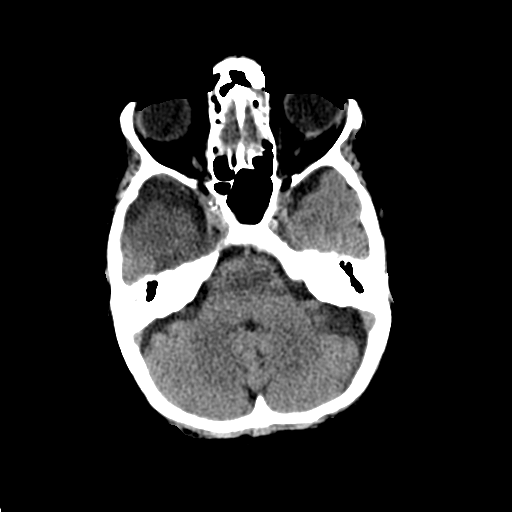
[im 9/30  brain]
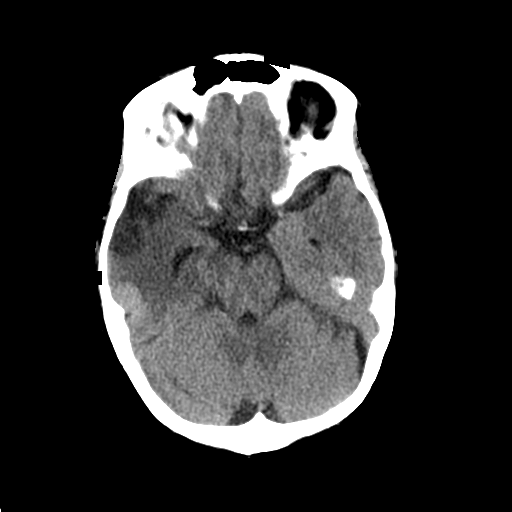
[im 11/30  brain]
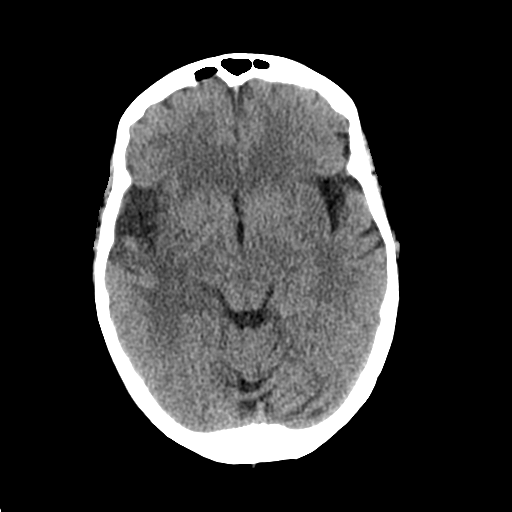
[im 11/30  bone]
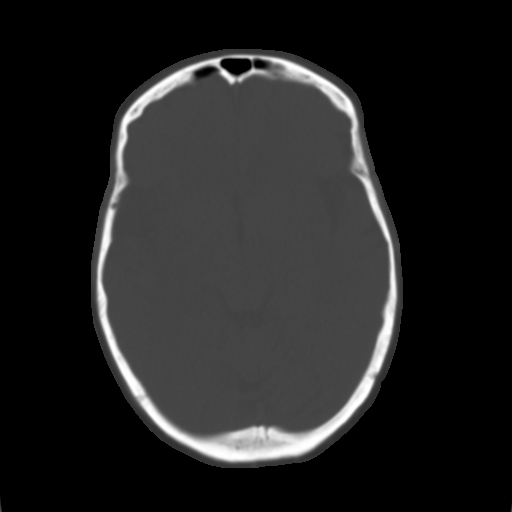
[im 13/30  brain]
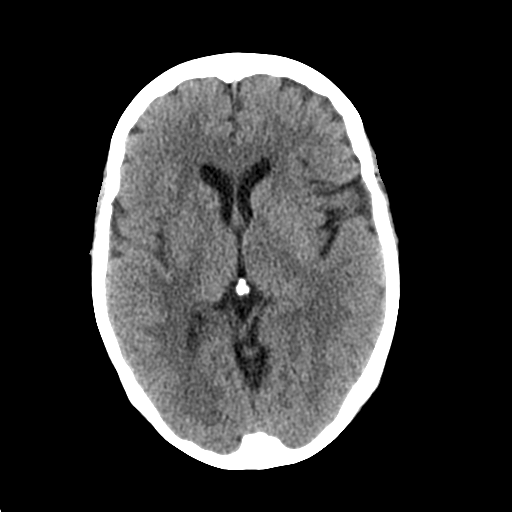
[im 15/30  brain]
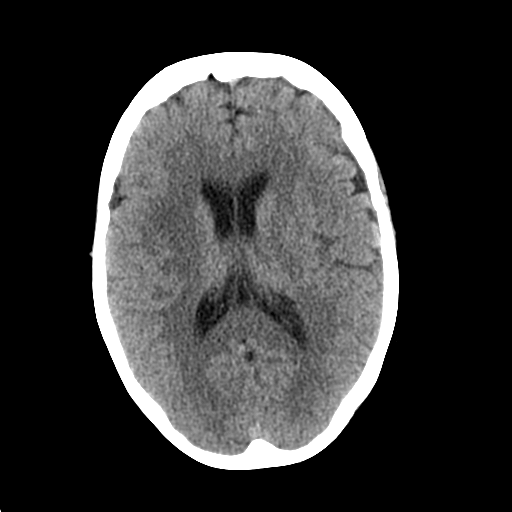
[im 17/30  brain]
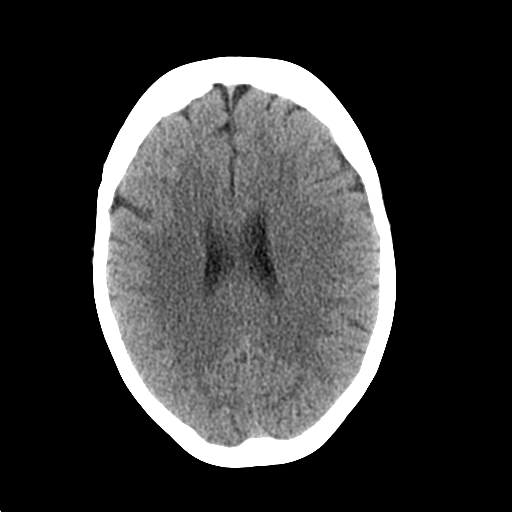
[im 19/30  brain]
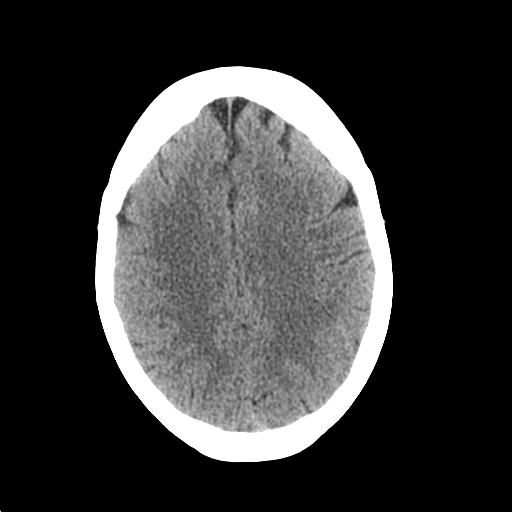
[im 19/30  bone]
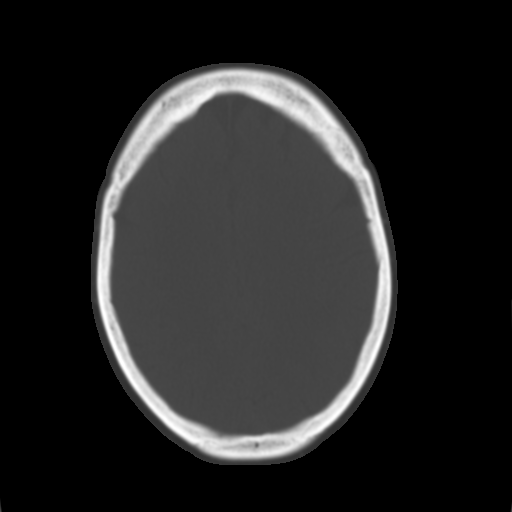
[im 21/30  brain]
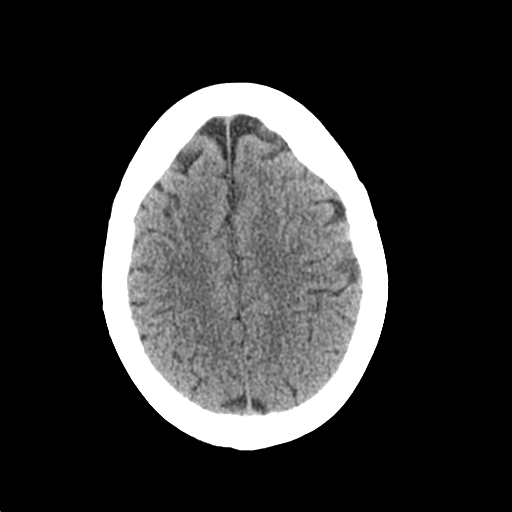
[im 23/30  brain]
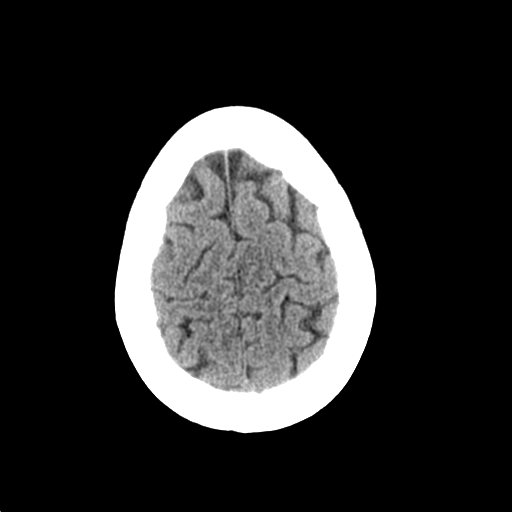
[im 25/30  brain]
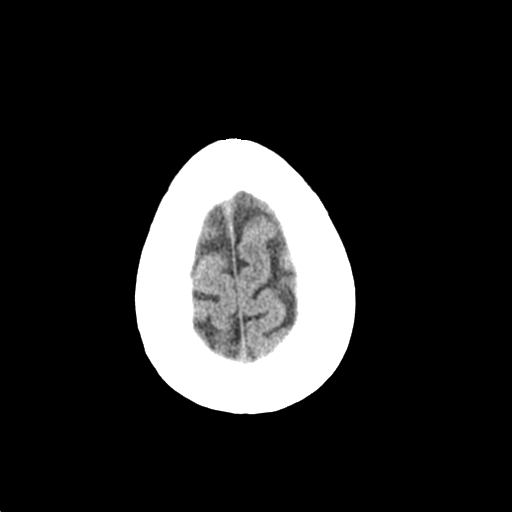
[im 27/30  brain]
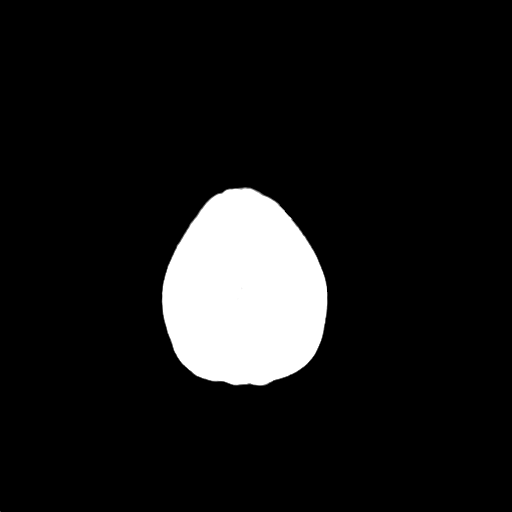
[im 27/30  bone]
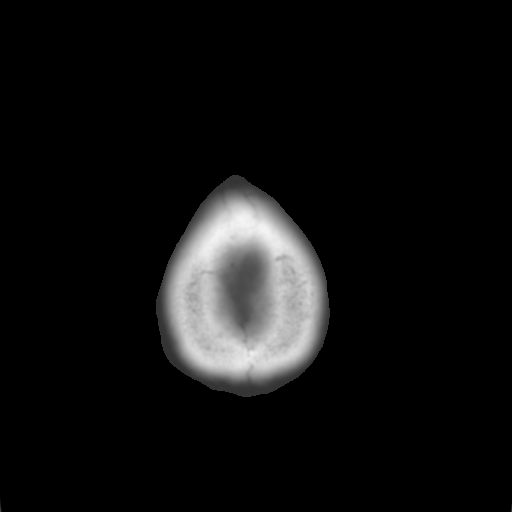

[Series 3: bone windows · axial · 0.43mm/px · z∈[-88,-68]mm · 2 of 30 slices shown]
[im 3/30  bone]
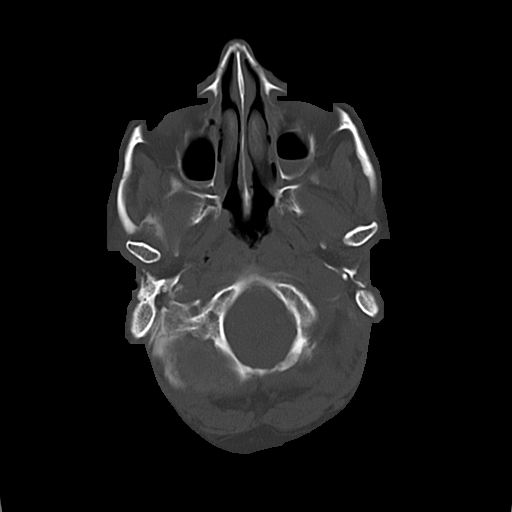
[im 7/30  bone]
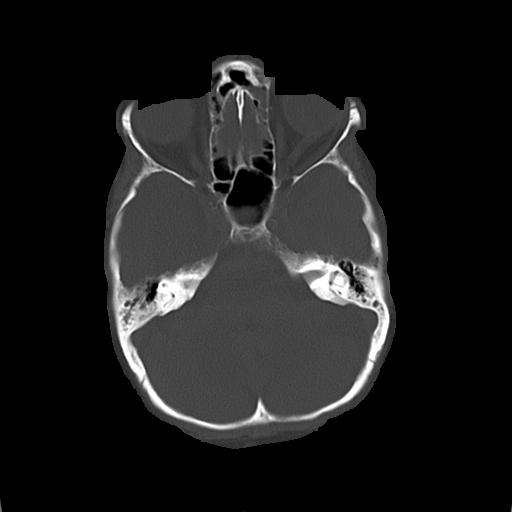

[15 of 30 positions shown; findings below may reference images not displayed]

FINDINGS: There is a low density throughout the right temporal lobe
with volume loss and prominence of the overlying sulci.  The left
temporal lobe appears normal.  There is no evidence of acute
intracranial hemorrhage, focal extra-axial fluid collection or
hydrocephalus. Some relatively high density is noted superior to
the right petrous apex along the posterior aspect of this temporal
lobe low density.  This likely represents volume averaging with
adjacent normal brain tissue, although could indicate a meningioma.

Mucosal thickening is present throughout the ethmoid sinuses.
There are air-fluid levels in the maxillary and sphenoid sinuses.
The mastoids and middle ears are clear.  The calvarium is intact.
IMPRESSION: 1.  Abnormal process in the right temporal lobe has a chronic
component manifesting as volume loss and may be the sequela of
prior infarction or trauma.  However, an acute superimposed process
such as herpes encephalitis cannot be completely excluded in this
clinical context.
2.  Possible meningioma over the right petrous apex.
3.  No evidence of acute intracranial hemorrhage or positive mass
effect.
4.  Sinusitis.

MRI of the brain without and with contrast is recommended for
further evaluation. These results were called by telephone on
08/17/2012 at 8434 hours to Dr. Lau, who verbally acknowledged
these results.

## 2014-06-12 ENCOUNTER — Encounter: Payer: Self-pay | Admitting: Family Medicine

## 2014-06-17 ENCOUNTER — Telehealth: Payer: Self-pay | Admitting: Family Medicine

## 2014-06-17 NOTE — Telephone Encounter (Addendum)
Patient dropped off an Attending Physician Statement on 06/17/2014 for completion by Dr. Leward Quan. Patient was unsure whether she needs a CPE before Dr. Leward Quan completes this form. If so, myself or Lauren can schedule this. Otherwise, we will wait for completion of this form in 5-7 business days. Patient wants this faxed and also wants to come in and pick up the original copy. A blank copy of this was scanned to patient's chart on 06/17/2014. Placed form on Dr. Janelle Floor desk at 104.  Please return to disability department when completed.   Thanks, [employer]

## 2014-06-24 ENCOUNTER — Telehealth: Payer: Self-pay

## 2014-06-24 NOTE — Telephone Encounter (Signed)
Patients FMLA paperwork has been faxed to [facility], along with records/labs/xray reports attached.  Called LMOM to notify patient.

## 2014-07-02 ENCOUNTER — Telehealth: Payer: Self-pay | Admitting: Family Medicine

## 2014-07-02 NOTE — Telephone Encounter (Signed)
Patient request for Dr. Leward Quan to complete disability forms for [employer]. I made a copy of the forms for the patient. I collected a $15 fee for the forms to be completed. The forms will be placed in the disability mail box at 104. Please call patient when forms are ready. [phone]

## 2014-07-07 NOTE — Telephone Encounter (Signed)
Received and scanned paperwork. [facility]

## 2014-07-10 ENCOUNTER — Encounter: Payer: Self-pay | Admitting: Family Medicine

## 2014-07-10 ENCOUNTER — Ambulatory Visit (INDEPENDENT_AMBULATORY_CARE_PROVIDER_SITE_OTHER): Payer: 59 | Admitting: Family Medicine

## 2014-07-10 VITALS — BP 151/94 | HR 73 | Temp 97.5°F | Resp 16 | Ht 58.5 in | Wt 190.0 lb

## 2014-07-10 DIAGNOSIS — M545 Low back pain, unspecified: Secondary | ICD-10-CM

## 2014-07-10 DIAGNOSIS — E669 Obesity, unspecified: Secondary | ICD-10-CM

## 2014-07-10 DIAGNOSIS — M1990 Unspecified osteoarthritis, unspecified site: Secondary | ICD-10-CM

## 2014-07-10 DIAGNOSIS — Z79899 Other long term (current) drug therapy: Secondary | ICD-10-CM | POA: Diagnosis not present

## 2014-07-10 DIAGNOSIS — Z1239 Encounter for other screening for malignant neoplasm of breast: Secondary | ICD-10-CM

## 2014-07-10 DIAGNOSIS — Z0000 Encounter for general adult medical examination without abnormal findings: Secondary | ICD-10-CM

## 2014-07-10 DIAGNOSIS — F32A Depression, unspecified: Secondary | ICD-10-CM

## 2014-07-10 DIAGNOSIS — M4316 Spondylolisthesis, lumbar region: Secondary | ICD-10-CM

## 2014-07-10 DIAGNOSIS — M722 Plantar fascial fibromatosis: Secondary | ICD-10-CM | POA: Diagnosis not present

## 2014-07-10 DIAGNOSIS — M25579 Pain in unspecified ankle and joints of unspecified foot: Secondary | ICD-10-CM | POA: Diagnosis not present

## 2014-07-10 DIAGNOSIS — D509 Iron deficiency anemia, unspecified: Secondary | ICD-10-CM

## 2014-07-10 DIAGNOSIS — Z01419 Encounter for gynecological examination (general) (routine) without abnormal findings: Secondary | ICD-10-CM | POA: Diagnosis not present

## 2014-07-10 DIAGNOSIS — F329 Major depressive disorder, single episode, unspecified: Secondary | ICD-10-CM

## 2014-07-10 DIAGNOSIS — M797 Fibromyalgia: Secondary | ICD-10-CM | POA: Diagnosis not present

## 2014-07-10 LAB — POCT URINALYSIS DIPSTICK
Bilirubin, UA: NEGATIVE
GLUCOSE UA: NEGATIVE
Ketones, UA: NEGATIVE
LEUKOCYTES UA: NEGATIVE
NITRITE UA: NEGATIVE
Protein, UA: NEGATIVE
RBC UA: NEGATIVE
Spec Grav, UA: 1.015
Urobilinogen, UA: 0.2
pH, UA: 6.5

## 2014-07-10 LAB — IRON AND TIBC
%SAT: 8 % — ABNORMAL LOW (ref 20–55)
Iron: 43 ug/dL (ref 42–145)
TIBC: 508 ug/dL — AB (ref 250–470)
UIBC: 465 ug/dL — ABNORMAL HIGH (ref 125–400)

## 2014-07-10 LAB — BASIC METABOLIC PANEL
BUN: 13 mg/dL (ref 6–23)
CO2: 21 mEq/L (ref 19–32)
CREATININE: 0.69 mg/dL (ref 0.50–1.10)
Calcium: 9.1 mg/dL (ref 8.4–10.5)
Chloride: 103 mEq/L (ref 96–112)
GLUCOSE: 84 mg/dL (ref 70–99)
POTASSIUM: 4.5 meq/L (ref 3.5–5.3)
Sodium: 135 mEq/L (ref 135–145)

## 2014-07-10 LAB — CBC
HCT: 37.5 % (ref 36.0–46.0)
Hemoglobin: 11.9 g/dL — ABNORMAL LOW (ref 12.0–15.0)
MCH: 22.4 pg — ABNORMAL LOW (ref 26.0–34.0)
MCHC: 31.7 g/dL (ref 30.0–36.0)
MCV: 70.5 fL — AB (ref 78.0–100.0)
MPV: 9.3 fL (ref 8.6–12.4)
PLATELETS: 249 10*3/uL (ref 150–400)
RBC: 5.32 MIL/uL — AB (ref 3.87–5.11)
RDW: 19.8 % — AB (ref 11.5–15.5)
WBC: 3.9 10*3/uL — ABNORMAL LOW (ref 4.0–10.5)

## 2014-07-10 LAB — POCT GLYCOSYLATED HEMOGLOBIN (HGB A1C): Hemoglobin A1C: 5.4

## 2014-07-10 MED ORDER — LEVOTHYROXINE SODIUM 50 MCG PO TABS: 50.0000 ug | ORAL_TABLET | Freq: Every day | ORAL | Status: DC

## 2014-07-10 MED ORDER — VALACYCLOVIR HCL 500 MG PO TABS: ORAL_TABLET | ORAL | Status: DC

## 2014-07-10 MED ORDER — IRON POLYSACCH CMPLX-B12-FA 150-0.025-1 MG PO CAPS: ORAL_CAPSULE | ORAL | Status: DC

## 2014-07-10 NOTE — Progress Notes (Signed)
Subjective:    Patient ID: Emily Steele, female    DOB: May 21, 1955, 59 y.o.   MRN: [id number]  HPI  This 59 y.o. Female is here for [facility] Subsequent CPE/PAP and update referrals (insurer has changed per pt). Pt requests PAP though she is s/p TAH.Pt has chronic pain and is s/p spine surgery in May 2015; she will need referral to Dr. Kary Kos for follow-up and eval of some new symptoms she developed after a recent fall. She c/o lumbar pain w/ paresthesias in lower extremities.She has iron def anemia and was prescribed iron but was confused about which supplement was for correcting the anemia. Pt had depression, treated by Dr. Toy Care; she is stable on current medications. Pt had a set-back in May 2014 when she was hospitalized w/ Herpetic encephalitis. Since starting daily valacyclovir, she has less confusion and HAs.  Pt was treated for plantar fasciitis through the fall and winter and is now out of CAM walker. She has a rash on lower extremities, R>L, that she thinks is poison ivy of reaction to lining of CAM walker. She has been treating it rash w/ topical anti-itch cream. She hopes to begin weight loss program once she is able to resume full weight -bearing and has LBP evaluated by Dr. Saintclair Halsted.   Patient Active Problem List   Diagnosis Date Noted  . Spondylolisthesis at L4-L5 level 08/18/2013  . Altered mental status 08/17/2012  . 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 CAP (community acquired pneumonia) 08/17/2012  . Degenerative disc disease 08/13/2011  . H/O vitamin D deficiency 08/13/2011  . Major Depressive Disorder 05/17/2011  . Fibromyalgia 05/17/2011  . Hypothyroidism 05/17/2011  . Sleep behavior disorder, REM 05/17/2011  . Osteopenia 05/17/2011  . Obesity 05/17/2011  . Plantar fasciitis 05/17/2011    Prior to Admission medications   Medication Sig Start Date End Date Taking? Authorizing Provider  amphetamine-dextroamphetamine (ADDERALL XR) 30 MG 24 hr capsule Take 30 mg by mouth 2  (two) times daily before a meal.    Yes Historical Provider, MD  ampicillin (PRINCIPEN) 500 MG capsule Take 500 mg by mouth 2 (two) times daily as needed (rosacea). 11/27/13  Yes Barton Fanny, MD  buPROPion [facility] SR) 200 MG 12 hr tablet Take 200 mg by mouth 2 (two) times daily.   Yes Historical Provider, MD  clindamycin (CLEOCIN T) 1 % external solution Apply 1 application topically 2 (two) times daily as needed (dermatits).   Yes Barton Fanny, MD  docusate sodium (COLACE) 100 MG capsule Take 100 mg by mouth at bedtime as needed for mild constipation.   Yes Historical Provider, MD  DULoxetine (CYMBALTA) 60 MG capsule Take 60 mg by mouth 2 (two) times daily. 03/13/11  Yes Historical Provider, MD  gabapentin (NEURONTIN) 300 MG capsule Take 300 mg by mouth 3 (three) times daily.   Yes Historical Provider, MD  levothyroxine (SYNTHROID, LEVOTHROID) 50 MCG tablet Take 1 tablet (50 mcg total) by mouth daily.   Yes Barton Fanny, MD  metaxalone (SKELAXIN) 800 MG tablet Take 800 mg by mouth 3 (three) times daily.   Yes Historical Provider, MD  METRONIDAZOLE, TOPICAL, 0.75 % LOTN Apply 1 application topically 2 (two) times daily. 04/21/14  Yes Barton Fanny, MD  oxyCODONE-acetaminophen (PERCOCET/ROXICET) 5-325 MG per tablet Take 1-2 tablets by mouth every 6 (six) hours as needed for severe pain.    Yes Historical Provider, MD  ranitidine (ZANTAC) 150 MG capsule Take 1 capsule (150 mg total) by  mouth 2 (two) times daily as needed for heartburn. 11/27/13  Yes Barton Fanny, MD  temazepam (RESTORIL) 15 MG capsule Take 30-45 mg by mouth at bedtime. 2 tablets one day, next day 3 tabs, then 2 tabs again. Alternate between 2-3 tabs at bedtime.   Yes Historical Provider, MD  tretinoin (RETIN-A) 0.05 % cream Apply 1 application topically at bedtime. 04/08/14  Yes Barton Fanny, MD  valACYclovir (VALTREX) 500 MG tablet Take 1 tablet twice a day.   Yes Barton Fanny, MD     Past Surgical History  Procedure Laterality Date  . Tonsillectomy and adenoidectomy    . Abdominal hysterectomy    . Spine surgery      Lamincetomy and Disectomy L5- S1  . Hand surgery Bilateral     ligimant transfere to thumbs  . Laminectomy  08/18/2013    L 4 L5    DR CRAM  . Maximum access (mas)posterior lumbar interbody fusion (plif) 1 level N/A 08/18/2013    Procedure: FOR MAXIMUM ACCESS (MAS) POSTERIOR LUMBAR INTERBODY FUSION (PLIF) 1 LEVEL;  Surgeon: Elaina Hoops, MD;  Location: [facility];  Service: Neurosurgery;  Laterality: N/A;  FOR MAXIMUM ACCESS (MAS) POSTERIOR LUMBAR INTERBODY FUSION (PLIF) 1 LEVEL L4-5    History   Social History  . Marital Status: Single    Spouse Name: N/A  . Number of Children: 0  . Years of Education: college   Occupational History  .  Other    n/a   Social History Main Topics  . Smoking status: Never Smoker   . Smokeless tobacco: Never Used  . Alcohol Use: No  . Drug Use: No  . Sexual Activity: No   Other Topics Concern  . Not on file   Social History Narrative   Patient lives at home alone.   Caffeine Use: 3-4 cups weekly   Single. Education: [employer].  Exercises 4x's a week.    Family History  Problem Relation Age of Onset  . Cancer Mother   . Hypertension Mother   . Cancer Sister   . Heart disease Sister   . Asthma Sister   . Asthma Brother   . Drug abuse Brother   . Hypertension Brother     Review of Systems  Constitutional: Negative.   HENT: Negative.   Eyes: Negative.   Respiratory: Negative.   Cardiovascular: Negative.   Gastrointestinal: Negative.   Endocrine: Negative.   Genitourinary: Negative.   Musculoskeletal: Positive for back pain, joint swelling, gait problem and neck pain.  Skin: Positive for rash.  Allergic/Immunologic: Negative.   Neurological: Negative.   Hematological: Negative.   Psychiatric/Behavioral: Positive for confusion, sleep disturbance and dysphoric mood. The patient is  nervous/anxious.        Objective:   Physical Exam  Constitutional: She is oriented to person, place, and time. She appears well-developed and well-nourished.  Blood pressure 151/94, pulse 73, temperature 97.5 F (36.4 C), temperature source Oral, resp. rate 16, height 4' 10.5" (1.486 m), weight 190 lb (86.183 kg), SpO2 98 %.   HENT:  Head: Normocephalic and atraumatic.  Right Ear: Hearing, tympanic membrane, external ear and ear canal normal.  Left Ear: Hearing, tympanic membrane, external ear and ear canal normal.  Nose: Nose normal. No nasal deformity or septal deviation.  Mouth/Throat: Uvula is midline and mucous membranes are normal. No oral lesions. Normal dentition. Posterior oropharyngeal erythema present.  Eyes: Conjunctivae, EOM and lids are normal. Pupils are equal, round, and reactive  to light. No scleral icterus.  Neck: Trachea normal and phonation normal. Neck supple. No JVD present. Muscular tenderness present. No spinous process tenderness present. Carotid bruit is not present. Decreased range of motion present. No thyroid mass and no thyromegaly present.  Cardiovascular: Normal rate, regular rhythm, S1 normal, S2 normal, normal heart sounds and normal pulses.   No extrasystoles are present. PMI is not displaced.  Exam reveals no gallop and no friction rub.   No murmur heard. Pulmonary/Chest: Effort normal and breath sounds normal. No respiratory distress. Right breast exhibits no inverted nipple, no mass, no nipple discharge, no skin change and no tenderness. Left breast exhibits no inverted nipple, no mass, no nipple discharge, no skin change and no tenderness. Breasts are symmetrical.  Abdominal: Soft. Normal appearance and bowel sounds are normal. She exhibits no distension, no abdominal bruit, no pulsatile midline mass and no mass. There is no hepatosplenomegaly. There is no tenderness. There is no guarding and no CVA tenderness.  Genitourinary: Vagina normal. There is no  rash, tenderness or lesion on the right labia. There is no rash, tenderness or lesion on the left labia. Right adnexum displays no mass, no tenderness and no fullness. Left adnexum displays no mass, no tenderness and no fullness.  Vaginal cuff intact.  Musculoskeletal:       Cervical back: She exhibits tenderness and spasm. She exhibits no bony tenderness, no deformity and no pain.       Thoracic back: She exhibits tenderness and spasm. She exhibits no bony tenderness, no deformity and no pain.       Lumbar back: She exhibits decreased range of motion, tenderness, bony tenderness and spasm. She exhibits no deformity and no pain.       Right lower leg: She exhibits edema. She exhibits no bony tenderness and no deformity.       Left lower leg: She exhibits edema. She exhibits no bony tenderness and no deformity.  Remainder of exam remarkable for degenerative changes in major joints.  Lymphadenopathy:       Head (right side): No submental, no submandibular, no tonsillar, no preauricular, no posterior auricular and no occipital adenopathy present.       Head (left side): No submental, no submandibular, no tonsillar, no preauricular, no posterior auricular and no occipital adenopathy present.    She has no cervical adenopathy.    She has no axillary adenopathy.       Right: No inguinal and no supraclavicular adenopathy present.       Left: No inguinal and no supraclavicular adenopathy present.  Neurological: She is alert and oriented to person, place, and time. She has normal strength. She is not disoriented. She displays no atrophy. No cranial nerve deficit or sensory deficit. She exhibits normal muscle tone. She displays a negative Romberg sign. Gait abnormal. Coordination normal.  Reflex Scores:      Tricep reflexes are 1+ on the right side and 1+ on the left side.      Bicep reflexes are 1+ on the right side and 1+ on the left side.      Brachioradialis reflexes are 1+ on the right side and 1+ on  the left side.      Patellar reflexes are 1+ on the right side and 1+ on the left side. Skin: Skin is warm, dry and intact. Rash noted. No lesion and no petechiae noted. She is not diaphoretic. No cyanosis or erythema. No pallor. Nails show no clubbing.  Lower R leg-  hyperpigmentation distally. No papules or macules or wheals.  Psychiatric: She has a normal mood and affect. Her speech is normal. Judgment and thought content normal. She is slowed. She is not agitated and not withdrawn. Cognition and memory are impaired. She is inattentive.  Nursing note and vitals reviewed.      Assessment & Plan:  Annual physical exam - Plan: POCT urinalysis dipstick  Encounter for routine pelvic examination - Plan: Pap IG (Image Guided)  Anemia, iron deficiency - Plan: Iron and TIBC, CBC  Arthritis - Plan: Vitamin D, 25-hydroxy, Ambulatory referral to Rheumatology  Encounter for long-term current use of medication - Plan: Basic metabolic panel  Obesity - Plan: POCT glycosylated hemoglobin (Hb A1C)  Spondylolisthesis at L4-L5 level - Plan: Ambulatory referral to Neurosurgery  Lumbar pain on palpation - Plan: Ambulatory referral to Neurosurgery  Screening for breast cancer - Plan: MM Digital Screening  Major Depressive Disorder - Plan: Ambulatory referral to Psychiatry  Fibromyalgia - Plan: Ambulatory referral to Rheumatology  Plantar fasciitis - Plan: Ambulatory referral to Podiatry  Pain in joint, ankle and foot, unspecified laterality - Plan: Ambulatory referral to Podiatry  Meds ordered this encounter  Medications  . levothyroxine (SYNTHROID, LEVOTHROID) 50 MCG tablet    Sig: Take 1 tablet (50 mcg total) by mouth daily.    Dispense:  30 tablet    Refill:  5  . valACYclovir (VALTREX) 500 MG tablet    Sig: Take 1 tablet twice a day.    Dispense:  60 tablet    Refill:  5  . Iron Polysacch Cmplx-B12-FA 150-0.025-1 MG CAPS    Sig: Take 1 capsule daily with a meal and orange juice.     Dispense:  90 each    Refill:  1

## 2014-07-10 NOTE — Patient Instructions (Signed)

## 2014-07-11 LAB — VITAMIN D 25 HYDROXY (VIT D DEFICIENCY, FRACTURES): VIT D 25 HYDROXY: 19 ng/mL — AB (ref 30–100)

## 2014-07-13 ENCOUNTER — Telehealth: Payer: Self-pay

## 2014-07-13 DIAGNOSIS — D329 Benign neoplasm of meninges, unspecified: Secondary | ICD-10-CM

## 2014-07-13 DIAGNOSIS — G9389 Other specified disorders of brain: Secondary | ICD-10-CM

## 2014-07-13 LAB — PAP IG (IMAGE GUIDED)

## 2014-07-13 NOTE — Telephone Encounter (Signed)
Patient is requesting an MRI of her Aaron Edelman. Per patient Dr Saintclair Halsted at [facility] is wanting her to have it done prior to being seen at their office. She states her last one was done in November of 2014. Patient also stated her insurance ends on 07/16/14 and she requested this be set up asap. Patients call back number is [phone]

## 2014-07-15 ENCOUNTER — Other Ambulatory Visit: Payer: Self-pay | Admitting: Family Medicine

## 2014-07-15 MED ORDER — ERGOCALCIFEROL 1.25 MG (50000 UT) PO CAPS: 50000.0000 [IU] | ORAL_CAPSULE | ORAL | Status: AC

## 2014-07-15 NOTE — Telephone Encounter (Signed)
Patient calling back requesting Dr Leward Quan to order an MRI of her Emily Steele. She stated Dr Saintclair Halsted at [facility] did it last time but is out of the office till Monday. Patient insurance runs out tomorrow. Patients call back number is [phone]

## 2014-07-15 NOTE — Telephone Encounter (Signed)
I have ordered MR Brain W Wo Contrast. Results should go to Dr. Kary Kos. Let pt know but I do not guarantee that it will be done before her insurance expires.

## 2014-07-15 NOTE — Telephone Encounter (Signed)
lmom to cb. 

## 2014-07-16 NOTE — Telephone Encounter (Signed)
Called Emily Steele and she states Crystal was handling this and will follow up on progress.

## 2014-07-24 DIAGNOSIS — Z0271 Encounter for disability determination: Secondary | ICD-10-CM

## 2014-08-31 ENCOUNTER — Telehealth: Payer: Self-pay

## 2014-08-31 MED ORDER — VALACYCLOVIR HCL 500 MG PO TABS: ORAL_TABLET | ORAL | Status: DC

## 2014-08-31 NOTE — Telephone Encounter (Signed)
Pt requesting refill on Valabyblobir?   Best phone for pt is [facility] [address].

## 2014-08-31 NOTE — Telephone Encounter (Signed)
Rx sent 

## 2014-09-24 ENCOUNTER — Encounter: Payer: Self-pay | Admitting: Podiatry

## 2014-09-24 ENCOUNTER — Ambulatory Visit (INDEPENDENT_AMBULATORY_CARE_PROVIDER_SITE_OTHER): Payer: 59

## 2014-09-24 ENCOUNTER — Ambulatory Visit (INDEPENDENT_AMBULATORY_CARE_PROVIDER_SITE_OTHER): Payer: 59 | Admitting: Podiatry

## 2014-09-24 ENCOUNTER — Encounter: Payer: Self-pay | Admitting: *Deleted

## 2014-09-24 VITALS — BP 121/72 | HR 67 | Resp 12

## 2014-09-24 DIAGNOSIS — M722 Plantar fascial fibromatosis: Secondary | ICD-10-CM

## 2014-09-24 DIAGNOSIS — M779 Enthesopathy, unspecified: Secondary | ICD-10-CM

## 2014-09-24 MED ORDER — TRIAMCINOLONE ACETONIDE 10 MG/ML IJ SUSP
10.0000 mg | Freq: Once | INTRAMUSCULAR | Status: AC
  Administered 2014-09-24: 10 mg

## 2014-09-24 NOTE — Progress Notes (Signed)
Subjective:     Patient ID: Emily Steele, female   DOB: 05-Jul-1955, 59 y.o.   MRN: [id number]  HPI patient states I developed pain in my left ankle and my right heel is improved. I'm here to pickup my orthotics and I am having trouble with his left ankle   Review of Systems     Objective:   Physical Exam Neurovascular status intact muscle strength adequate with discomfort around posterior tibial tendon as it goes under the medial malleolus with inflammation and fluid buildup and noted also to have orthotics that are here for this patient with minimal discomfort on the plantar heel right    Assessment:     Tendinitis posterior tibial tendon left with plantar fasciitis right doing better    Plan:     Careful sheath injection left posterior tib tendon 3 mg Kenalog 5 mg Xylocaine and dispensed orthotics with instructions on usage. Also dispensed to fascial brace is to lift up the arch bilateral

## 2014-09-24 NOTE — Progress Notes (Signed)
   Subjective:    Patient ID: Emily Steele [facility], female    DOB: 1956-04-17, 59 y.o.   MRN: [id number]  HPI  Patient has inflammation and pain  in L ankle dating to about 3 weeks ago. R plantar fascitis has not improved and needs a new fascial brace. Patient recently injured R great toe and wants it looked at. PATIENT IS ASKING ABOUT A PREVIOUS MEDICAL "DO NOT DRIVE"   Review of Systems  Constitutional: Negative.   HENT: Negative.   Eyes: Negative.   Respiratory: Negative.   Cardiovascular: Negative.   Gastrointestinal: Negative.   Genitourinary: Negative.   Skin: Positive for color change.  Allergic/Immunologic: Negative.   Neurological: Negative.   Hematological: Negative.   Psychiatric/Behavioral: Negative.        Objective:   Physical Exam        Assessment & Plan:

## 2014-11-19 ENCOUNTER — Ambulatory Visit: Payer: Self-pay | Admitting: Neurology

## 2014-11-27 ENCOUNTER — Telehealth: Payer: Self-pay

## 2014-11-27 DIAGNOSIS — D509 Iron deficiency anemia, unspecified: Secondary | ICD-10-CM

## 2014-11-27 DIAGNOSIS — E039 Hypothyroidism, unspecified: Secondary | ICD-10-CM

## 2014-11-27 NOTE — Telephone Encounter (Signed)
Patient called in and stated that the medicine we prescribed for her which is the polyiron her insurance will not pay for that so she wanted [place] to call in an old medication that Dr. Leward Quan had her on called ergocalciferol(Drisdol) the insurance will pay for that one. She also needs a refill on her levothyroxine please.

## 2014-11-27 NOTE — Telephone Encounter (Signed)
Please advise 

## 2014-11-28 NOTE — Telephone Encounter (Signed)
Please get some more information - I am not sure these are the same medications - sounds like her insurance will not cover her iron but the ergocalciferol is Vit D that she would like -

## 2014-11-30 MED ORDER — LEVOTHYROXINE SODIUM 50 MCG PO TABS: 50.0000 ug | ORAL_TABLET | Freq: Every day | ORAL | Status: DC

## 2014-11-30 MED ORDER — FERROUS SULFATE 325 (65 FE) MG PO TABS: 325.0000 mg | ORAL_TABLET | Freq: Every day | ORAL | Status: DC

## 2014-11-30 NOTE — Telephone Encounter (Signed)
Spoke with pt, she needs a iron supplement that her insurance will cover.  Iron Polysacch Cmplx-B12-FA 150-0.025-1 MG CAPS [[id number]]   I called [employer] and they do not cover iron medications. She will have to buy some OTC. Please advise.

## 2014-11-30 NOTE — Telephone Encounter (Signed)
I've sent in a 6 month supply of ferrous sulfate. She should take once daily. Take it with juice on an empty stomach. She should come check her labs in about 3 months to see if we need to change the dose. I also refilled her thyroid med for 6 months. She needs to take this 30-60 minutes prior to any food or other medications.

## 2014-12-02 NOTE — Telephone Encounter (Signed)
Spoke with pt, advised I called her insurance but did not get enough information or help from them. I advised her to see how much the iron is at her pharmacy and let me know if there is anything I can do to help if anything. Pt understood.

## 2015-01-06 ENCOUNTER — Telehealth: Payer: Self-pay

## 2015-01-06 NOTE — Telephone Encounter (Signed)
Patient has a question about the iron medication. The pharmacy instructions are different that the instruction given by the provider and she needs advising. Please call! [phone]

## 2015-01-06 NOTE — Telephone Encounter (Signed)
Spoke with patient, told her it would be best to take the iron tablets with food in order to avoid an upset stomach.

## 2015-01-07 ENCOUNTER — Encounter: Payer: Self-pay | Admitting: Neurology

## 2015-01-07 ENCOUNTER — Ambulatory Visit (INDEPENDENT_AMBULATORY_CARE_PROVIDER_SITE_OTHER): Payer: 59 | Admitting: Neurology

## 2015-01-07 VITALS — BP 130/90 | HR 74 | Ht 58.5 in | Wt 194.2 lb

## 2015-01-07 DIAGNOSIS — R569 Unspecified convulsions: Secondary | ICD-10-CM | POA: Diagnosis not present

## 2015-01-07 DIAGNOSIS — R4189 Other symptoms and signs involving cognitive functions and awareness: Secondary | ICD-10-CM

## 2015-01-07 DIAGNOSIS — R6889 Other general symptoms and signs: Secondary | ICD-10-CM

## 2015-01-07 DIAGNOSIS — Z8619 Personal history of other infectious and parasitic diseases: Secondary | ICD-10-CM

## 2015-01-07 DIAGNOSIS — D329 Benign neoplasm of meninges, unspecified: Secondary | ICD-10-CM

## 2015-01-07 DIAGNOSIS — IMO0001 Reserved for inherently not codable concepts without codable children: Secondary | ICD-10-CM

## 2015-01-07 DIAGNOSIS — Z8661 Personal history of infections of the central nervous system: Secondary | ICD-10-CM

## 2015-01-07 DIAGNOSIS — H9190 Unspecified hearing loss, unspecified ear: Secondary | ICD-10-CM | POA: Diagnosis not present

## 2015-01-07 NOTE — Patient Instructions (Addendum)
1.  Routine EEG 2.  US carotids 3.  Encouraged to think about moving into an assisted living facility. 4.  Return to clinic in 3 months  [facility]  Preventing Falls in the Home   Falls are common, often dreaded events in the lives of older people. Aside from the obvious injuries and even death that may result, falls can cause wide-ranging consequences including loss of independence, mental decline, decreased activity, and mobility. Younger people are also at risk of falling, especially those with chronic illnesses and fatigue.  Ways to reduce the risk for falling:  * Examine diet and medications. Warm foods and alcohol dilate blood vessels, which can lead to dizziness when standing. Sleep aids, antidepressants, and pain medications can also increase the likelihood of a fall.  * Get a vison exam. Poor vision, cataracts, and glaucoma increase the chances of falling.  * Check foot gear. Shoes should fit snugly and have a sturdy, nonskid sole and broad, low heel.  * Participate in a physician-approved exercise program to build and maintain muscle strength and improve balance and coordination.  * Increase vitamin D intake. Vitamin D improves muscle strength and increases the amount of calcium the body is able to absorb and deposit in bones.  How to prevent falls from common hazards:  * Floors - Remove all loose wires, cords, and throw rugs. Minimize clutter. Make sure rugs are anchored and smooth. Keep furniture in its usual place.  * Chairs - Use chairs with straight backs, armrests, and firm seats. Add firm cushions to existing pieces to add height.  * Bathroom - Install grab bars and non-skid tape in the tub or shower. Use a bathtub transfer bench or a shower chair with a back support. Use an elevated toilet seat and/or safety rails to assist standing from a low surface. Do not use towel racks or bathroom tissue holders to help you stand.  * Lighting - Make sure halls, stairways, and  entrances are well-lit. Install a night light in your bathroom or hallway. Make sure there is a light switch at the top and bottom of the staircase. Turn lights on if you get up in the middle of the night. Make sure lamps or light switches are within reach of the bed if you have to get up during the night.  * Kitchen - Install non-skid rubber mats near the sink and stove. Clean spills immediately. Store frequently used utensils, pots, and pans between waist and eye level. This helps prevent reaching and bending. Sit when getting things out of the lower cupboards.  * Living room / [place] furniture with wide spaces in between, giving enough room to move around. Establish a route through the living room that gives you something to hold onto as you walk.  * Stairs - Make sure treads, rails, and rugs are secure. Install a rail on both sides of the stairs. If stairs are a threat, it might be helpful to arrange most of your activities on the lower level to reduce the number of times you must climb the stairs.  * Entrances and doorways - Install metal handles on the walls adjacent to the doorknobs of all doors to make it more secure as you travel through the doorway.  Tips for maintaining balance:  * Keep at least one hand free at all times Try using a backpack or fanny pack to hold things rather than carrying them in your hands. Never carry objects in both hands when  walking as this interferes with keeping your balance.  * Attempt to swing both arms from front to back while walking. This might require a conscious effort if Parkinson's disease has diminished your movement. It will, however, help you to maintain balance and posture, and reduce fatigue.  * Consciously lift your feet off the ground when walking. Shuffling and dragging of the feet is a common culprit in losing your balance.  * When trying to navigate turns, use a "U" technique of facing forward and making a wide turn, rather than pivoting  sharply.  * Try to stand with your feet shoulder-length apart. When your feet are close together for any length of time, you increase your risk of losing your balance and falling.  * Do one thing at a time. Do not try to walk and accomplish another task, such as reading or looking around. The decrease in your automatic reflexes complicates motor function, so the less distraction, the better.  * Do not wear rubber or gripping soled shoes, they might "catch" on the floor and cause tripping.  * Move slowly when changing positions. Use deliberate, concentrated movements and, if needed, use a grab bar or walking aid. Count fifteen (15) seconds after standing to begin walking.  * If balance is a continuous problem, you might want to consider a walking aid such as a cane, walking stick, or walker. Once you have mastered walking with help, you may be ready to try it again on your own.  This information is provided by [facility] and is not intended to replace the medical advice of your physician or other health care providers. Please consult your physician or other health care providers for advice regarding your specific medical condition.

## 2015-01-07 NOTE — Progress Notes (Signed)
Note faxed.

## 2015-01-07 NOTE — Progress Notes (Signed)
[facility] Clinic Note - Initial Visit   Date: 01/07/2015  Emily Steele MRN: [id number] DOB: [birth year]   Dear Dr. Toy Care:  Thank you for your kind referral of Emily Steele for consultation of meningioma. Although her history is well known to you, please allow [place] to reiterate it for the purpose of our medical record. The patient was accompanied to the clinic by self.    History of Present Illness: Emily Steele is a 59 y.o. right-handed Caucasian female with bipolar disorder, schizoaffective disorder, severe spinal stenosis of the lumbar spine s/p lumbar decompression at L4-5 (2015), fibromyalgia, depression, GERD, REM behavior disorder, and history of viral encephalitis (2014) presenting for evaluation of meningioma.    She had one episode of altered mental status in May 2014 where she was hospitalized for suspected viral encephalitis.  Work-up showed CSF pleoocytosis (13 WBC) abnormality involving the right temporal lobe raising the possibility of HSV, along with possible meningioma.  She was also being treated for pneumonia and UTI and it was thought that her encephalopathy may be been due to systemic infection, in addition to encephalitis.  Imaging was discussed with Dr. Vertell Limber who would only recommend resection of the meningioma if she started developing symptoms from this.  Her most recent MRI brain was performed in June 15th 2016 which shows chronic right temporal lobe and right > left insular atrophy, chronic but progressed from 2014, does seem most compatible with herpes encephalitis.  Posterior right temporal lobe region meningioma has minimally enlarged from 2014.  Since this incident, she had noticed problems with memory which has slowly improved but not resolved. She is more concerned with short-term memory, such as forgetting how to get to places and gets lost, misplacing things (i.e. glasses), forgetting appointments.   She lives alone and has burned  a few pots because she forgot that the stove was on.  She manages her own IADLs and uses a pill box otherwise she will forget the medications .  She always prints the directions when going somewhere.   She has not been involved in any car accidents.   She is having a few falls, but is also wearing a boot because of of right plantar fascitis.  Additionally, learning new tasks is very challenging for her.   She also complains of many other issues such as not seeing a dentist in the past year, pain related to fibromyalgia, mood issues which are beyond the scope of this office visit.   Starting experiencing flu-like symptoms in the Spring of 2016 which was similar to what she had experienced previously so became concerned.  At night time, she often hears a radio, but cannot find anyone where the sound is coming frome.  It occurs about 3-4 times per week, lasting up to 30-min.  No associated alteration of consciousness or change in behavior.  She questioned whether she has auditory hallucinations, so was referred to neurology.  She has a history of schizoaffective disorder and bipolar disorder and sees Dr. Chucky May.    Out-side paper records, electronic medical record, and images have been reviewed where available and summarized as:  MRI brain wwo contrast 09/30/2014: 1.  No acute intracranial pathology. 2.  Chronic right temporal lobe and right > left insular atrophy, chronic but progressed from 2014, does seem most compatible with herpes encephalitis.  3.  Small superimposed posterior right temporal lobe region meningioma has minimally enlarged from 2014.  No associated edema.  MRI brain wwo  contrast 02/20/2013:  Progression of right temporal lobe atrophy in comparison with May 2014.  Given the previousl T2 hyperintensity in this location in a nonvascular distribution, findings most consistent with post infectious sequela of HSV encephalitis.   Slight increase size incidental right temporal meningioma,  now 16 x 17 x 85mm.  MRI brain 08/21/2012  1.  Diffuse encephalomalacia of the right temporal lobe.  The HSV PCR is negative.  This may represent a remote injury due to herpes simplex.  A more typical viral encephalitis is still considered. Abnormal white cells with a lymphocytic predominance are noted in the CSF. 2.  Diffuse white matter signal in the right temporal lobe may be chronic. 3.  Abnormal signal along the insular cortex bilaterally is compatible with encephalitis.  Active disease remains a concern. 4.  No evidence for acute infarct or hemorrhage. 5.  The solid enhancing lesion is more clearly extra-axial and compatible with a meningioma. 6.  Diffuse sinus disease.  Past Medical History  Diagnosis Date  . Depression   . Fibromyalgia   . Hypothyroid   . Sleep behavior disorder, REM   . Osteopenia   . Obesity   . Plantar fasciitis   . Arthritis   . GERD (gastroesophageal reflux disease)   . Anemia   . Osteoporosis   . Rosacea   . Eczema   . Fibromyalgia   . UTI (urinary tract infection)   . Hx of migraine headaches   . Constipation   . Anxiety   . Allergy   . Hypertension     not on medications  . Brain tumor     right temporal, not ca  . Pneumonia 08/2012  . DDD (degenerative disc disease)   . Osteoarthritis   . Toenail fungus   . Hearing loss     1 ear - mild  . Herpes simplex     Past Surgical History  Procedure Laterality Date  . Tonsillectomy and adenoidectomy    . Abdominal hysterectomy    . Spine surgery      Lamincetomy and Disectomy L5- S1  . Hand surgery Bilateral     ligimant transfere to thumbs  . Laminectomy  08/18/2013    L 4 L5    DR CRAM  . Maximum access (mas)posterior lumbar interbody fusion (plif) 1 level N/A 08/18/2013    Procedure: FOR MAXIMUM ACCESS (MAS) POSTERIOR LUMBAR INTERBODY FUSION (PLIF) 1 LEVEL;  Surgeon: Elaina Hoops, MD;  Location: [facility];  Service: Neurosurgery;  Laterality: N/A;  FOR MAXIMUM ACCESS (MAS) POSTERIOR  LUMBAR INTERBODY FUSION (PLIF) 1 LEVEL L4-5     Medications:  Outpatient Encounter Prescriptions as of 01/07/2015  Medication Sig Note  . amphetamine-dextroamphetamine (ADDERALL XR) 30 MG 24 hr capsule Take 30 mg by mouth 2 (two) times daily before a meal.    . ampicillin (PRINCIPEN) 500 MG capsule Take 500 mg by mouth 2 (two) times daily as needed (rosacea).   Marland Kitchen buPROPion (WELLBUTRIN SR) 200 MG 12 hr tablet Take 200 mg by mouth 2 (two) times daily.   . cholecalciferol (VITAMIN D) 1000 UNITS tablet Take 1,000 Units by mouth daily.   . clindamycin (CLEOCIN T) 1 % external solution Apply 1 application topically 2 (two) times daily as needed (dermatits).   . diclofenac (FLECTOR) 1.3 % PTCH Place 1 patch onto the skin 2 (two) times daily.   . diclofenac sodium (VOLTAREN) 1 % GEL Apply topically 4 (four) times daily.   Marland Kitchen docusate sodium (  COLACE) 100 MG capsule Take 100 mg by mouth at bedtime as needed for mild constipation.   . ergocalciferol (DRISDOL) 50000 UNITS capsule Take 1 capsule (50,000 Units total) by mouth once a week.   . gabapentin (NEURONTIN) 300 MG capsule Take 300 mg by mouth 3 (three) times daily.   . Iron Polysacch Cmplx-B12-FA 150-0.025-1 MG CAPS Take 1 capsule daily with a meal and orange juice.   Marland Kitchen levothyroxine (SYNTHROID, LEVOTHROID) 50 MCG tablet Take 1 tablet (50 mcg total) by mouth daily.   Marland Kitchen lidocaine (LIDODERM) 5 % Place 1 patch onto the skin daily. Remove & Discard patch within 12 hours or as directed by MD   . metaxalone (SKELAXIN) 800 MG tablet Take 800 mg by mouth 3 (three) times daily.   Marland Kitchen oxyCODONE-acetaminophen (PERCOCET/ROXICET) 5-325 MG per tablet Take 1-2 tablets by mouth every 6 (six) hours as needed for severe pain.  11/27/2013: prn  . ranitidine (ZANTAC) 150 MG capsule Take 1 capsule (150 mg total) by mouth 2 (two) times daily as needed for heartburn.   . temazepam (RESTORIL) 15 MG capsule Take 30-45 mg by mouth at bedtime. 2 tablets one day, next day 3 tabs,  then 2 tabs again. Alternate between 2-3 tabs at bedtime.   . tretinoin (RETIN-A) 0.05 % cream Apply 1 application topically at bedtime.   . [DISCONTINUED] DULoxetine (CYMBALTA) 60 MG capsule Take 60 mg by mouth 2 (two) times daily.   . [DISCONTINUED] ferrous sulfate 325 (65 FE) MG tablet Take 1 tablet (325 mg total) by mouth daily with breakfast.   . [DISCONTINUED] METRONIDAZOLE, TOPICAL, 0.75 % LOTN Apply 1 application topically 2 (two) times daily.   . [DISCONTINUED] valACYclovir (VALTREX) 500 MG tablet Take 1 tablet twice a day.    Facility-Administered Encounter Medications as of 01/07/2015  Medication  . triamcinolone acetonide (KENALOG) 10 MG/ML injection 10 mg     Allergies:  Allergies  Allergen Reactions  . Doxycycline Other (See Comments)    headaches  . Sulfa Antibiotics     Headaches  . Tape Itching  . Methocarbamol Nausea Only    headaches    Family History: Family History  Problem Relation Age of Onset  . Cancer Mother   . Hypertension Mother   . Cancer Sister   . Heart disease Sister   . Asthma Sister   . Asthma Brother   . Drug abuse Brother   . Hypertension Brother     Social History: Social History  Substance Use Topics  . Smoking status: Never Smoker   . Smokeless tobacco: Never Used  . Alcohol Use: No   Social History   Social History Narrative   Patient lives at home alone.   Caffeine Use: 3-4 cups weekly   Single. Education: [employer].  Exercises 4x's a week.    Review of Systems:  CONSTITUTIONAL: No fevers, chills, night sweats, or weight loss.   EYES: No visual changes or eye pain ENT: +hearing changes.  No history of nose bleeds.   RESPIRATORY: No cough, wheezing and shortness of breath.   CARDIOVASCULAR: Negative for chest pain, and palpitations.   GI: Negative for abdominal discomfort, blood in stools or black stools.  No recent change in bowel habits.   GU:  No history of incontinence.   MUSCLOSKELETAL: No history of joint pain or  swelling.  No myalgias.   SKIN: Negative for lesions, rash, and itching.   HEMATOLOGY/ONCOLOGY: Negative for prolonged bleeding, bruising easily, and swollen nodes.  ENDOCRINE: Negative  for cold or heat intolerance, polydipsia or goiter.   PSYCH:  +depression or anxiety symptoms.   NEURO: As Above.   Vital Signs:  BP 130/90 mmHg  Pulse 74  Ht 4' 10.5" (1.486 m)  Wt 194 lb 3 oz (88.083 kg)  BMI 39.89 kg/m2  SpO2 99%   General Medical Exam:   General:  Obese appearing, tearful at times, comfortable.   Eyes/ENT: see cranial nerve examination.   Neck: No masses appreciated.  Full range of motion without tenderness.  No carotid bruits. Respiratory:  Clear to auscultation, good air entry bilaterally.   Cardiac:  Regular rate and rhythm, no murmur.   Extremities:  No deformities, edema, or skin discoloration.  Skin:  No rashes or lesions.  Neurological Exam: MENTAL STATUS including orientation to time, place, person, recent and remote memory, attention span and concentration, language, and fund of knowledge is good.  Speech is not dysarthric. Verbose and difficult to redirect at times.   Montreal Cognitive Assessment  01/07/2015  Visuospatial/ Executive (0/5) 4  Naming (0/3) 3  Attention: Read list of digits (0/2) 2  Attention: Read list of letters (0/1) 1  Attention: Serial 7 subtraction starting at 100 (0/3) 3  Language: Repeat phrase (0/2) 2  Language : Fluency (0/1) 1  Abstraction (0/2) 2  Delayed Recall (0/5) 4  Orientation (0/6) 5  Total 27  Adjusted Score (based on education) 27    CRANIAL NERVES: II:  No visual field defects.  Unremarkable fundi.   III-IV-VI: Pupils equal round and reactive to light.  Normal conjugate, extra-ocular eye movements in all directions of gaze.  No nystagmus.  No ptosis.  V:  Normal facial sensation.   VII:  Normal facial symmetry and movements.  Pathologic facial reflexes are present.  VIII:  Normal hearing and vestibular function.   IX-X:   Normal palatal movement.   XI:  Normal shoulder shrug and head rotation.   XII:  Normal tongue strength and range of motion, no deviation or fasciculation.  MOTOR:  No atrophy, fasciculations or abnormal movements.  No pronator drift.  Tone is normal.    Right Upper Extremity:    Left Upper Extremity:    Deltoid  5/5   Deltoid  5/5   Biceps  5/5   Biceps  5/5   Triceps  5/5   Triceps  5/5   Wrist extensors  5/5   Wrist extensors  5/5   Wrist flexors  5/5   Wrist flexors  5/5   Finger extensors  5/5   Finger extensors  5/5   Finger flexors  5/5   Finger flexors  5/5   Dorsal interossei  5/5   Dorsal interossei  5/5   Abductor pollicis  5/5   Abductor pollicis  5/5   Tone (Ashworth scale)  0  Tone (Ashworth scale)  0   Right Lower Extremity:    Left Lower Extremity:    Hip flexors  5/5   Hip flexors  5/5   Hip extensors  5/5   Hip extensors  5/5   Knee flexors  5/5   Knee flexors  5/5   Knee extensors  5/5   Knee extensors  5/5   Dorsiflexors  5/5   Dorsiflexors  5/5   Plantarflexors  5/5   Plantarflexors  5/5   Toe extensors  5/5   Toe extensors  5/5   Toe flexors  5/5   Toe flexors  5/5   Tone (Ashworth scale)  0  Tone (Ashworth scale)  0   MSRs:  Right                                                                 Left brachioradialis 2+  brachioradialis 3+  biceps 2+  biceps 3+  triceps 2+  triceps 2+  patellar 2+  patellar 3+  ankle jerk 2+  ankle jerk 2+  Hoffman no  Hoffman no  plantar response down  plantar response down   SENSORY:  Normal and symmetric perception of light touch, pinprick, vibration, and proprioception.  Romberg's sign absent.   COORDINATION/GAIT: Normal finger-to- nose-finger and heel-to-shin.  Intact rapid alternating movements bilaterally.  Wide based gait due to body habitus.   IMPRESSION/PLAN: Ms. Rosier is a 59 year-old female with history of viral encephalitis (2014) and known left temporal meningioma here for evaluation of auditory spells  and memory loss.  With the temporal and insular involvement of her prior encephalitis, seizures need to be evaluated for with routine EEG.  With regards to her memory, she scored well on her [employer] 27/30 today but by history, she is clearly having difficulty maintaining IADLs.  I had a lengthy discussion regarding home safety and the need to either get supervised home health care or consider transitioning into an assisted living facility.  She reports being on a wait list to get home health and would like to pursue this first.  I am very concerned about her home situation as she lives alone.  Social work referral through [facility] will be initiated.    Her meningioma seems to be stable in size and does not need surgical management.  Unless there is epileptiform discharges on her EEG to suggest there are seizures from this area, continue conservative management with annual surveillance.  Return to clinic in 3 months.   The duration of this appointment visit was 60 minutes of face-to-face time with the patient.  Greater than 50% of this time was spent in counseling, explanation of diagnosis, planning of further management, and coordination of care.   Thank you for allowing me to participate in patient's care.  If I can answer any additional questions, I would be pleased to do so.    Sincerely,    Donika K. Posey Pronto, DO

## 2015-01-08 ENCOUNTER — Inpatient Hospital Stay (HOSPITAL_COMMUNITY): Admission: RE | Admit: 2015-01-08 | Payer: 59 | Source: Ambulatory Visit

## 2015-01-08 DIAGNOSIS — R0989 Other specified symptoms and signs involving the circulatory and respiratory systems: Secondary | ICD-10-CM

## 2015-01-10 ENCOUNTER — Other Ambulatory Visit: Payer: Self-pay | Admitting: Physician Assistant

## 2015-01-11 ENCOUNTER — Other Ambulatory Visit: Payer: 59

## 2015-01-11 ENCOUNTER — Other Ambulatory Visit: Payer: Self-pay | Admitting: Neurosurgery

## 2015-01-11 DIAGNOSIS — M5137 Other intervertebral disc degeneration, lumbosacral region: Secondary | ICD-10-CM

## 2015-01-14 ENCOUNTER — Ambulatory Visit
Admission: RE | Admit: 2015-01-14 | Discharge: 2015-01-14 | Disposition: A | Discharge status: Home / Self Care | Payer: 59 | Source: Ambulatory Visit | Attending: Neurosurgery | Admitting: Neurosurgery

## 2015-01-14 ENCOUNTER — Other Ambulatory Visit: Payer: Self-pay

## 2015-01-14 DIAGNOSIS — M51379 Other intervertebral disc degeneration, lumbosacral region without mention of lumbar back pain or lower extremity pain: Secondary | ICD-10-CM

## 2015-01-14 DIAGNOSIS — M5137 Other intervertebral disc degeneration, lumbosacral region: Secondary | ICD-10-CM

## 2015-01-14 MED ORDER — VALACYCLOVIR HCL 500 MG PO TABS: ORAL_TABLET | ORAL | Status: DC

## 2015-01-15 ENCOUNTER — Ambulatory Visit (HOSPITAL_COMMUNITY)
Admission: RE | Admit: 2015-01-15 | Discharge: 2015-01-15 | Disposition: A | Discharge status: Home / Self Care | Payer: 59 | Source: Ambulatory Visit | Attending: Neurology | Admitting: Neurology

## 2015-01-15 DIAGNOSIS — R569 Unspecified convulsions: Secondary | ICD-10-CM

## 2015-01-15 DIAGNOSIS — D329 Benign neoplasm of meninges, unspecified: Secondary | ICD-10-CM | POA: Insufficient documentation

## 2015-01-15 DIAGNOSIS — R4189 Other symptoms and signs involving cognitive functions and awareness: Secondary | ICD-10-CM

## 2015-01-15 DIAGNOSIS — H9190 Unspecified hearing loss, unspecified ear: Secondary | ICD-10-CM | POA: Diagnosis not present

## 2015-01-15 DIAGNOSIS — I10 Essential (primary) hypertension: Secondary | ICD-10-CM | POA: Diagnosis not present

## 2015-01-15 DIAGNOSIS — R6889 Other general symptoms and signs: Secondary | ICD-10-CM

## 2015-01-15 DIAGNOSIS — I6522 Occlusion and stenosis of left carotid artery: Secondary | ICD-10-CM | POA: Insufficient documentation

## 2015-01-15 DIAGNOSIS — IMO0001 Reserved for inherently not codable concepts without codable children: Secondary | ICD-10-CM

## 2015-01-18 ENCOUNTER — Ambulatory Visit (INDEPENDENT_AMBULATORY_CARE_PROVIDER_SITE_OTHER): Payer: 59 | Admitting: Neurology

## 2015-01-18 DIAGNOSIS — H9190 Unspecified hearing loss, unspecified ear: Secondary | ICD-10-CM

## 2015-01-18 DIAGNOSIS — R4189 Other symptoms and signs involving cognitive functions and awareness: Secondary | ICD-10-CM

## 2015-01-18 DIAGNOSIS — R6889 Other general symptoms and signs: Secondary | ICD-10-CM

## 2015-01-18 DIAGNOSIS — IMO0001 Reserved for inherently not codable concepts without codable children: Secondary | ICD-10-CM

## 2015-01-18 DIAGNOSIS — D329 Benign neoplasm of meninges, unspecified: Secondary | ICD-10-CM

## 2015-01-19 NOTE — Procedures (Signed)
ELECTROENCEPHALOGRAM REPORT  Date of Study: 01/18/2015  Patient's Name: Emily Steele MRN: [id number] Date of Birth: 10/19/1955  Referring Provider: Dr. Narda Amber  Clinical History: This is a 59 year old woman with auditory hallucinations and memory loss.  Medications: Neurontin, Adderall, Wellbutrin, Vitamin D, Cleocin, Flector, Colace, Drisdol, Voltaren, Levothyroxine, Skelaxin, Percocet/Roxicet, Zantac, Restoril  Technical Summary: A multichannel digital EEG recording measured by the international 10-20 system with electrodes applied with paste and impedances below 5000 ohms performed in our laboratory with EKG monitoring in an awake and asleep patient.  Hyperventilation and photic stimulation were performed.  The digital EEG was referentially recorded, reformatted, and digitally filtered in a variety of bipolar and referential montages for optimal display.    Description: The patient is awake and asleep during the recording.  During maximal wakefulness, there is a symmetric, medium voltage 10.5-11 Hz posterior dominant rhythm that attenuates with eye opening.  There is frequent focal 5-7 Hz slowing seen over the right mid-temporal region, at times sharply contoured without clear epileptogenic potential. During drowsiness and stage I sleep, there is an increase in theta slowing of the background with posterior occipital sharp transients of sleep (POSTs) seen. Hyperventilation and photic stimulation did not elicit any abnormalities.  There were no clear epileptiform discharges or electrographic seizures seen.    EKG lead was unremarkable.  Impression: This awake and asleep EEG is abnormal due to focal slowing over the right mid-temporal region.  Clinical Correlation of the above findings indicates focal cerebral dysfunction over the right mid-temporal region suggestive of underlying structural or physiologic abnormality. The absence of epileptiform discharges does not exclude a  clinical diagnosis of epilepsy.  If further clinical questions remain, prolonged EEG may be helpful.  Clinical correlation is advised.   Ellouise Newer, M.D.

## 2015-01-21 ENCOUNTER — Telehealth: Payer: Self-pay | Admitting: Neurology

## 2015-01-21 NOTE — Telephone Encounter (Signed)
-----   Message from Chester Holstein, LPN sent at [phone]  1:22 PM EDT ----- Thank you Manuela Schwartz!!!

## 2015-01-21 NOTE — Telephone Encounter (Signed)
Called to set up appt. Left message on cell and home phone to call me back on Monday 01/25/2015 and I will try to call her then as well to set up amb EEG appt. I told her to look at 02/01/2015 tentatively for an appt on that date. Laureen Ochs

## 2015-01-22 ENCOUNTER — Ambulatory Visit: Payer: 59 | Admitting: Family Medicine

## 2015-01-25 ENCOUNTER — Telehealth: Payer: Self-pay | Admitting: *Deleted

## 2015-01-25 NOTE — Telephone Encounter (Signed)
Pt returned your call/ call back @ [phone]

## 2015-01-26 ENCOUNTER — Other Ambulatory Visit: Payer: Self-pay | Admitting: *Deleted

## 2015-01-26 DIAGNOSIS — R6889 Other general symptoms and signs: Secondary | ICD-10-CM

## 2015-01-26 DIAGNOSIS — R413 Other amnesia: Secondary | ICD-10-CM

## 2015-01-26 DIAGNOSIS — IMO0001 Reserved for inherently not codable concepts without codable children: Secondary | ICD-10-CM

## 2015-01-26 DIAGNOSIS — R4189 Other symptoms and signs involving cognitive functions and awareness: Secondary | ICD-10-CM

## 2015-01-26 DIAGNOSIS — H9190 Unspecified hearing loss, unspecified ear: Secondary | ICD-10-CM

## 2015-01-29 ENCOUNTER — Ambulatory Visit: Payer: 59 | Admitting: Family Medicine

## 2015-02-03 ENCOUNTER — Ambulatory Visit (INDEPENDENT_AMBULATORY_CARE_PROVIDER_SITE_OTHER): Payer: 59 | Admitting: Neurology

## 2015-02-03 DIAGNOSIS — R413 Other amnesia: Secondary | ICD-10-CM

## 2015-02-03 DIAGNOSIS — R4189 Other symptoms and signs involving cognitive functions and awareness: Secondary | ICD-10-CM

## 2015-02-03 DIAGNOSIS — R6889 Other general symptoms and signs: Secondary | ICD-10-CM

## 2015-02-03 DIAGNOSIS — H9190 Unspecified hearing loss, unspecified ear: Secondary | ICD-10-CM

## 2015-02-03 DIAGNOSIS — IMO0001 Reserved for inherently not codable concepts without codable children: Secondary | ICD-10-CM

## 2015-02-10 NOTE — Procedures (Signed)
ELECTROENCEPHALOGRAM REPORT  Dates of Recording: 02/03/2015 to 02/04/2015  Patient's Name: Emily Steele MRN: [id number] Date of Birth: [birth year]  Referring Provider: Dr. Narda Amber  Procedure: 24-hour ambulatory EEG  History: This is a 59 year old woman with auditory hallucinations and memory loss. EEG for classification of auditory spells.  Medications: Neurontin, Adderall, Wellbutrin, Vitamin D, Cleocin, Flector, Colace, Drisdol, Voltaren, Levothyroxine, Skelaxin, Percocet/Roxicet, Zantac, Restoril  Technical Summary: This is a 24-hour multichannel digital EEG recording measured by the international 10-20 system with electrodes applied with paste and impedances below 5000 ohms performed as portable with EKG monitoring.  The digital EEG was referentially recorded, reformatted, and digitally filtered in a variety of bipolar and referential montages for optimal display.    DESCRIPTION OF RECORDING: During maximal wakefulness, the background activity consisted of a symmetric 11 Hz posterior dominant rhythm which was reactive to eye opening. There is occasional focal 5-6 Hz theta slowing seen over the right mid-temporal region.  There were no epileptiform discharges seen in wakefulness.  During the recording, the patient progresses through wakefulness, drowsiness, and Stage 2 sleep.  Similar occasional focal slowing was seen over the right mid-temporal region. Again, there were no epileptiform discharges seen.  Events: On 10/19 at 2008 hours, patient reports hearing music at the back of the house. She walked down the hall and it stopped. She came back, sat down, and heard it again. She walked down the hall at it was very soft. She went outside and could not hear it. Electrographically, there were no epileptiform discharges or EKG changes seen.  On 10/19 at 2154 hours, she reports more music, did not last long. Electrographically, there were no epileptiform discharges or EKG changes  seen.  There were no electrographic seizures seen.  EKG lead was unremarkable.  IMPRESSION: This 24-hour ambulatory EEG study is abnormal due to occasional focal slowing over the right mid-temporal region. Episodes of hearing music did not show any epileptiform correlate.  CLINICAL CORRELATION of the above findings indicates focal cerebral dysfunction over the right mid-temporal region suggestive of underlying structural or physiologic abnormality. Episodes of hearing music did not show any epileptiform correlate, however simple partial seizures can have negative scalp EEG. There were no epileptiform discharges or electrographic seizures seen throughout the study. Clinical correlation is advised.  Ellouise Newer, M.D.

## 2015-02-11 ENCOUNTER — Telehealth: Payer: Self-pay | Admitting: Neurology

## 2015-02-11 NOTE — Telephone Encounter (Signed)
Pt returned your call/she said to let the phone ring a while to give her time to answer it/call back @ [phone]

## 2015-02-11 NOTE — Telephone Encounter (Signed)
Called patient back and let it ring until it went to voicemail.

## 2015-02-12 ENCOUNTER — Telehealth: Payer: Self-pay | Admitting: Neurology

## 2015-02-12 NOTE — Telephone Encounter (Signed)
[phone] pt returned your call

## 2015-03-12 ENCOUNTER — Encounter: Payer: Self-pay | Admitting: Family Medicine

## 2015-03-12 ENCOUNTER — Ambulatory Visit (INDEPENDENT_AMBULATORY_CARE_PROVIDER_SITE_OTHER): Payer: 59 | Admitting: Family Medicine

## 2015-03-12 VITALS — BP 148/80 | HR 76 | Temp 97.6°F | Resp 16 | Wt 194.0 lb

## 2015-03-12 DIAGNOSIS — Z23 Encounter for immunization: Secondary | ICD-10-CM

## 2015-03-12 DIAGNOSIS — E034 Atrophy of thyroid (acquired): Secondary | ICD-10-CM

## 2015-03-12 DIAGNOSIS — M797 Fibromyalgia: Secondary | ICD-10-CM | POA: Diagnosis not present

## 2015-03-12 DIAGNOSIS — E038 Other specified hypothyroidism: Secondary | ICD-10-CM

## 2015-03-12 DIAGNOSIS — D329 Benign neoplasm of meninges, unspecified: Secondary | ICD-10-CM

## 2015-03-12 DIAGNOSIS — M8580 Other specified disorders of bone density and structure, unspecified site: Secondary | ICD-10-CM

## 2015-03-12 DIAGNOSIS — R4189 Other symptoms and signs involving cognitive functions and awareness: Secondary | ICD-10-CM

## 2015-03-12 DIAGNOSIS — Z1159 Encounter for screening for other viral diseases: Secondary | ICD-10-CM | POA: Diagnosis not present

## 2015-03-12 DIAGNOSIS — B004 Herpesviral encephalitis: Secondary | ICD-10-CM | POA: Diagnosis not present

## 2015-03-12 DIAGNOSIS — D509 Iron deficiency anemia, unspecified: Secondary | ICD-10-CM

## 2015-03-12 DIAGNOSIS — E559 Vitamin D deficiency, unspecified: Secondary | ICD-10-CM

## 2015-03-12 DIAGNOSIS — B370 Candidal stomatitis: Secondary | ICD-10-CM | POA: Diagnosis not present

## 2015-03-12 DIAGNOSIS — Z803 Family history of malignant neoplasm of breast: Secondary | ICD-10-CM | POA: Diagnosis not present

## 2015-03-12 LAB — CBC WITH DIFFERENTIAL/PLATELET
BASOS ABS: 0 10*3/uL (ref 0.0–0.1)
BASOS PCT: 0 % (ref 0–1)
EOS PCT: 2 % (ref 0–5)
Eosinophils Absolute: 0.1 10*3/uL (ref 0.0–0.7)
HCT: 40.1 % (ref 36.0–46.0)
Hemoglobin: 12.8 g/dL (ref 12.0–15.0)
Lymphocytes Relative: 31 % (ref 12–46)
Lymphs Abs: 1 10*3/uL (ref 0.7–4.0)
MCH: 24.1 pg — AB (ref 26.0–34.0)
MCHC: 31.9 g/dL (ref 30.0–36.0)
MCV: 75.4 fL — ABNORMAL LOW (ref 78.0–100.0)
MONO ABS: 0.3 10*3/uL (ref 0.1–1.0)
MPV: 9 fL (ref 8.6–12.4)
Monocytes Relative: 10 % (ref 3–12)
Neutro Abs: 1.8 10*3/uL (ref 1.7–7.7)
Neutrophils Relative %: 57 % (ref 43–77)
Platelets: 240 10*3/uL (ref 150–400)
RBC: 5.32 MIL/uL — AB (ref 3.87–5.11)
RDW: 19.9 % — ABNORMAL HIGH (ref 11.5–15.5)
WBC: 3.1 10*3/uL — AB (ref 4.0–10.5)

## 2015-03-12 LAB — COMPREHENSIVE METABOLIC PANEL
ALBUMIN: 4 g/dL (ref 3.6–5.1)
ALT: 18 U/L (ref 6–29)
AST: 16 U/L (ref 10–35)
Alkaline Phosphatase: 107 U/L (ref 33–130)
BUN: 14 mg/dL (ref 7–25)
CALCIUM: 9 mg/dL (ref 8.6–10.4)
CHLORIDE: 106 mmol/L (ref 98–110)
CO2: 22 mmol/L (ref 20–31)
Creat: 0.67 mg/dL (ref 0.50–1.05)
Glucose, Bld: 81 mg/dL (ref 65–99)
POTASSIUM: 4.7 mmol/L (ref 3.5–5.3)
Sodium: 139 mmol/L (ref 135–146)
TOTAL PROTEIN: 6.2 g/dL (ref 6.1–8.1)
Total Bilirubin: 0.2 mg/dL (ref 0.2–1.2)

## 2015-03-12 LAB — IRON: Iron: 40 ug/dL — ABNORMAL LOW (ref 45–160)

## 2015-03-12 LAB — IBC PANEL
%SAT: 8 % — AB (ref 11–50)
TIBC: 480 ug/dL — AB (ref 250–450)
UIBC: 440 ug/dL — AB (ref 125–400)

## 2015-03-12 LAB — VITAMIN B12: Vitamin B-12: 432 pg/mL (ref 211–911)

## 2015-03-12 LAB — TSH: TSH: 3.94 u[IU]/mL (ref 0.350–4.500)

## 2015-03-12 LAB — T4, FREE: FREE T4: 1.08 ng/dL (ref 0.80–1.80)

## 2015-03-12 MED ORDER — CLINDAMYCIN PHOSPHATE 1 % EX SOLN: 1.0000 "application " | Freq: Two times a day (BID) | CUTANEOUS | Status: DC | PRN

## 2015-03-12 MED ORDER — AMPICILLIN 500 MG PO CAPS: 500.0000 mg | ORAL_CAPSULE | Freq: Two times a day (BID) | ORAL | Status: DC

## 2015-03-12 MED ORDER — VALACYCLOVIR HCL 500 MG PO TABS: ORAL_TABLET | ORAL | Status: DC

## 2015-03-12 MED ORDER — TRETINOIN 0.05 % EX CREA: 1.0000 "application " | TOPICAL_CREAM | Freq: Every day | CUTANEOUS | Status: DC

## 2015-03-12 MED ORDER — NYSTATIN 100000 UNIT/ML MT SUSP: 5.0000 mL | Freq: Four times a day (QID) | OROMUCOSAL | Status: DC
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
## 2015-03-13 LAB — VITAMIN D 25 HYDROXY (VIT D DEFICIENCY, FRACTURES): VIT D 25 HYDROXY: 23 ng/mL — AB (ref 30–100)

## 2015-03-13 LAB — HEPATITIS C ANTIBODY: HCV Ab: NEGATIVE

## 2015-03-15 ENCOUNTER — Other Ambulatory Visit: Payer: Self-pay | Admitting: Family Medicine

## 2015-03-16 ENCOUNTER — Telehealth: Payer: Self-pay

## 2015-03-16 DIAGNOSIS — L719 Rosacea, unspecified: Secondary | ICD-10-CM | POA: Insufficient documentation

## 2015-03-16 NOTE — Telephone Encounter (Signed)
Can we fill her zantac?

## 2015-03-16 NOTE — Telephone Encounter (Signed)
PA needed for tretinoin cream. Completed on covermymeds. It appeared by questions on form the a smaller quantity might be approved (20 g or less). I called and spoke to pharm who tried to run the 20 g size through, and still requires a PA, but he will leave it at lower quantity and I will send PA through for this since the higher quantity is definitely not going to be approved. PA pending.

## 2015-03-17 NOTE — Telephone Encounter (Signed)
PA approved for 20 gram size through 03/15/16. Notified pharm.

## 2015-04-07 ENCOUNTER — Telehealth: Payer: Self-pay

## 2015-04-07 NOTE — Telephone Encounter (Signed)
Pt said you had mentioned to her that you were going to prescribe a medicine that would help prevent strokes? Do you know what she is talking about and what med that might be?

## 2015-04-13 DIAGNOSIS — E559 Vitamin D deficiency, unspecified: Secondary | ICD-10-CM | POA: Insufficient documentation

## 2015-04-13 DIAGNOSIS — D509 Iron deficiency anemia, unspecified: Secondary | ICD-10-CM | POA: Insufficient documentation

## 2015-04-13 DIAGNOSIS — Z803 Family history of malignant neoplasm of breast: Secondary | ICD-10-CM | POA: Insufficient documentation

## 2015-04-15 ENCOUNTER — Other Ambulatory Visit: Payer: Self-pay

## 2015-04-15 DIAGNOSIS — Z1231 Encounter for screening mammogram for malignant neoplasm of breast: Secondary | ICD-10-CM

## 2015-04-27 NOTE — Telephone Encounter (Signed)
Left message for pt to call back  °

## 2015-04-27 NOTE — Telephone Encounter (Signed)
Call --- I usually recommend aspirin 81mg  one tablet daily for heart attack and stroke prevention for all women greater than age 59 and for women over age 45 with a strong family history of early heart disease.

## 2015-04-28 ENCOUNTER — Telehealth: Payer: Self-pay

## 2015-04-28 NOTE — Telephone Encounter (Signed)
Spoke with pt, she wanted to see if Dr. Tamala Julian would take over prescribing her psychiatric medications for now on. She states her insurance has changed and she has to pay 75 dollars to see her psychiatrist every visit and she cannot afford this. She is willing to come in talk to you about this and will sign a release for her records to be released for you to review. Please advise.

## 2015-04-28 NOTE — Telephone Encounter (Signed)
SPoke with pt, advised pt message from Dr. Tamala Julian. Pt understood.

## 2015-04-28 NOTE — Telephone Encounter (Signed)
Recommend patient schedule appointment to discuss.

## 2015-04-29 NOTE — Telephone Encounter (Signed)
Spoke with pt. Will set up an appt.   Scheduling, can we call her when Dr. Thompson Caul schedule is available in EPIC. Thanks

## 2015-05-03 NOTE — Telephone Encounter (Signed)
LMVM advising patient that she has an appointment scheduled with Dr. Tamala Julian on Tuesday, Feb 28th @ 8:00.  If this time does not work for her she can call back to reschedule.

## 2015-05-07 ENCOUNTER — Ambulatory Visit: Payer: 59 | Admitting: Neurology

## 2015-05-27 ENCOUNTER — Telehealth: Payer: Self-pay

## 2015-05-27 NOTE — Telephone Encounter (Signed)
Patient has changed insurance companies and location for filling her prescriptions   She is out of levothyroxine (SYNTHROID, LEVOTHROID) 50 MCG tablet And temazepam (RESTORIL) 15 MG capsule   States the pharmacy will be sending over a request for her other medications and would like these two REFILLED also.   [phone] (M)

## 2015-06-01 MED ORDER — TEMAZEPAM 15 MG PO CAPS: 30.0000 mg | ORAL_CAPSULE | Freq: Every day | ORAL | Status: DC

## 2015-06-01 NOTE — Telephone Encounter (Signed)
Routing to [place].

## 2015-06-01 NOTE — Telephone Encounter (Signed)
I am helping with Emily Steele's absence Pamala Hurry. Please let me now if you need me to sign anything for this patient.

## 2015-06-01 NOTE — Telephone Encounter (Signed)
Pamala Hurry,  Have you received anything on this patient for ALL of her medications.   [phone]  They lost it and want her to go over the whole list again.  Mail order service.

## 2015-06-01 NOTE — Telephone Encounter (Signed)
No I went through all of my faxes and don't have any reqs for pt from mail order. April printed off list of pt's meds and I tried to call pt to go over it, but she did not ans. LM to CB. [employer] printed/signed and with pt's list at my desk waiting for CB. Which mail order also?

## 2015-06-03 ENCOUNTER — Ambulatory Visit: Payer: Self-pay | Admitting: Neurology

## 2015-06-03 NOTE — Telephone Encounter (Signed)
LMOM again for pt CB.

## 2015-06-10 NOTE — Telephone Encounter (Signed)
LMOM again to CB if she wants med refills bf her appt on 2/28. I have not gotten a call back from pt or any requests from pharmacies, so don't know which mail order to send Rxs to.

## 2015-06-15 ENCOUNTER — Ambulatory Visit: Payer: Self-pay | Admitting: Family Medicine

## 2015-06-15 ENCOUNTER — Telehealth: Payer: Self-pay

## 2015-06-15 NOTE — Telephone Encounter (Signed)
Patient would like to know if Dr. Tamala Julian can fit her in within the next couple of weeks, but there are no open slots. Came in today for blood draw, and urine anal.

## 2015-06-15 NOTE — Telephone Encounter (Signed)
Call --- I am booked for the next several months.  What is patient needing to come in for?

## 2015-06-16 NOTE — Telephone Encounter (Signed)
Tried to call pt. But number is disconnected

## 2015-08-04 ENCOUNTER — Ambulatory Visit (INDEPENDENT_AMBULATORY_CARE_PROVIDER_SITE_OTHER): Payer: Self-pay | Admitting: Physician Assistant

## 2015-08-04 VITALS — BP 126/80 | HR 62 | Temp 97.6°F | Resp 17 | Ht 58.5 in | Wt 165.0 lb

## 2015-08-04 DIAGNOSIS — E039 Hypothyroidism, unspecified: Secondary | ICD-10-CM

## 2015-08-04 DIAGNOSIS — N3000 Acute cystitis without hematuria: Secondary | ICD-10-CM

## 2015-08-04 DIAGNOSIS — Z760 Encounter for issue of repeat prescription: Secondary | ICD-10-CM

## 2015-08-04 DIAGNOSIS — R300 Dysuria: Secondary | ICD-10-CM

## 2015-08-04 LAB — POCT URINALYSIS DIP (MANUAL ENTRY)
BILIRUBIN UA: NEGATIVE
GLUCOSE UA: NEGATIVE
NITRITE UA: NEGATIVE
PH UA: 7.5
Protein Ur, POC: 30 — AB
Spec Grav, UA: 1.015
Urobilinogen, UA: 0.2

## 2015-08-04 LAB — POC MICROSCOPIC URINALYSIS (UMFC): Mucus: ABSENT

## 2015-08-04 MED ORDER — BUPROPION HCL 100 MG PO TABS: 100.0000 mg | ORAL_TABLET | Freq: Three times a day (TID) | ORAL | Status: DC

## 2015-08-04 MED ORDER — LEVOTHYROXINE SODIUM 50 MCG PO TABS: 50.0000 ug | ORAL_TABLET | Freq: Every day | ORAL | Status: DC

## 2015-08-04 MED ORDER — NITROFURANTOIN MONOHYD MACRO 100 MG PO CAPS: 100.0000 mg | ORAL_CAPSULE | Freq: Two times a day (BID) | ORAL | Status: DC

## 2015-08-04 NOTE — Progress Notes (Signed)
08/04/2015 12:25 PM   DOB: January 04, 1956 / MRN: [id number]  SUBJECTIVE:  Emily Steele is a 60 y.o. female presenting for for the evaluation of dysuria, urgency, frequency, and incomplete bladder emptying that started about 3 weeks ago.  Tried essential oil baths that helped for a while but her symptoms became worse about 4 days ago. Denies nausea, fever, back pain.   She is going through some financial hardship right now and lost her insurance coverage.  She needs refills of her Levothyroxine and would like to stay on her bupropion but can not afford this.    She is allergic to doxycycline; sulfa antibiotics; tape; and methocarbamol.   She  has a past medical history of Depression; Fibromyalgia; Hypothyroid; Sleep behavior disorder, REM; Osteopenia; Obesity; Plantar fasciitis; Arthritis; GERD (gastroesophageal reflux disease); Anemia; Osteoporosis; Rosacea; Eczema; Fibromyalgia; UTI (urinary tract infection); migraine headaches; Constipation; Anxiety; Allergy; Hypertension; Brain tumor ([place]); Pneumonia (08/2012); DDD (degenerative disc disease); Osteoarthritis; Toenail fungus; Hearing loss; and Herpes simplex.    She  reports that she has never smoked. She has never used smokeless tobacco. She reports that she does not drink alcohol or use illicit drugs. She  reports that she does not engage in sexual activity. The patient  has past surgical history that includes Tonsillectomy and adenoidectomy; Abdominal hysterectomy; Spine surgery; Hand surgery (Bilateral); Laminectomy (08/18/2013); and Maximum access (mas)posterior lumbar interbody fusion (plif) 1 level (N/A, 08/18/2013).  Her family history includes Asthma in her brother and sister; Cancer in her sister; Cancer (age of onset: 43) in her mother; Drug abuse in her brother; Heart disease in her sister; Hypertension in her brother and mother.  Review of Systems  Constitutional: Negative for fever and chills.  Eyes: Negative for blurred vision.    Respiratory: Negative for cough and shortness of breath.   Cardiovascular: Negative for chest pain.  Gastrointestinal: Negative for nausea and abdominal pain.  Genitourinary: Positive for dysuria, urgency and frequency. Negative for hematuria and flank pain.  Musculoskeletal: Negative for myalgias.  Skin: Negative for rash.  Neurological: Negative for dizziness, tingling, focal weakness and headaches.  Psychiatric/Behavioral: Negative for depression, suicidal ideas, hallucinations, memory loss and substance abuse. The patient is not nervous/anxious and does not have insomnia.     Problem list and medications reviewed and updated by myself where necessary, and exist elsewhere in the encounter.   OBJECTIVE:  BP 126/80 mmHg  Pulse 62  Temp(Src) 97.6 F (36.4 C) (Oral)  Resp 17  Ht 4' 10.5" (1.486 m)  Wt 165 lb (74.844 kg)  BMI 33.89 kg/m2  SpO2 99%  Physical Exam  Constitutional: She is oriented to person, place, and time. She appears well-nourished. No distress.  HENT:  Right Ear: Hearing and tympanic membrane normal.  Left Ear: Hearing and tympanic membrane normal.  Nose: Mucosal edema present. No sinus tenderness. Right sinus exhibits no maxillary sinus tenderness and no frontal sinus tenderness. Left sinus exhibits no maxillary sinus tenderness and no frontal sinus tenderness.  Mouth/Throat: Uvula is midline, oropharynx is clear and moist and mucous membranes are normal.  Eyes: EOM are normal. Pupils are equal, round, and reactive to light.  Cardiovascular: Normal rate.   Pulmonary/Chest: Effort normal.  Abdominal: She exhibits no distension.  Neurological: She is alert and oriented to person, place, and time. No cranial nerve deficit. Skin: Skin is dry. She is not diaphoretic.  Vitals reviewed.  Results for orders placed or performed in visit on 08/04/15 (from the past 48 hour(s))  POCT  Microscopic Urinalysis ([facility])     Status: Abnormal   Collection Time: 08/04/15 11:51  AM  Result Value Ref Range   WBC,UR,HPF,POC Many (A) None WBC/hpf   RBC,UR,HPF,POC Few (A) None RBC/hpf   Bacteria Few (A) None, Too numerous to count   Mucus Absent Absent   Epithelial Cells, UR Per Microscopy Few (A) None, Too numerous to count cells/hpf  POCT urinalysis dipstick     Status: Abnormal   Collection Time: 08/04/15 11:51 AM  Result Value Ref Range   Color, UA yellow yellow   Clarity, UA cloudy (A) clear   Glucose, UA negative negative   Bilirubin, UA negative negative   Ketones, POC UA trace (5) (A) negative   Spec Grav, UA 1.015    Blood, UA trace-lysed (A) negative   pH, UA 7.5    Protein Ur, POC =30 (A) negative   Urobilinogen, UA 0.2    Nitrite, UA Negative Negative   Leukocytes, UA small (1+) (A) Negative     ASSESSMENT AND PLAN:  [name] was seen today for dysuria.  Diagnoses and all orders for this visit:  Dysuria: Urine sample shows cystitis.  Given financial hardship will hold off on culture for now.  Advised that she call if she is not getting better and will move her to cipro.  -     POCT Microscopic Urinalysis ([facility]) -     POCT urinalysis dipstick  Hypothyroidism, unspecified hypothyroidism type: See problem 3.  -     levothyroxine (SYNTHROID, LEVOTHROID) 50 MCG tablet; Take 1 tablet (50 mcg total) by mouth daily.  Medication refill: Given financial hardship will start non xr/sr wellbutrin 100 mg tid.  Advised that she return to clinic as soon as her insurance problems are resolved for labs.  She reports her TSH has been stable and CHL reflect this. Will refill at her current dose. .  -     buPROPion (WELLBUTRIN) 100 MG tablet; Take 1 tablet (100 mg total) by mouth 3 (three) times daily.  Acute cystitis without hematuria -     nitrofurantoin, macrocrystal-monohydrate, (MACROBID) 100 MG capsule; Take 1 capsule (100 mg total) by mouth 2 (two) times daily.    The patient was advised to call or return to clinic if she does not see an improvement in  symptoms or to seek the care of the closest emergency department if she worsens with the above plan.   Philis Fendt, MHS, PA-C [facility] 08/04/2015 12:25 PM

## 2015-08-04 NOTE — Patient Instructions (Signed)
     IF you received an x-ray today, you will receive an invoice from [facility]. Please contact [facility] at [phone] with questions or concerns regarding your invoice.   IF you received labwork today, you will receive an invoice from [facility]. Please contact [facility] at [phone] with questions or concerns regarding your invoice.   Our billing staff will not be able to assist you with questions regarding bills from these companies.  You will be contacted with the lab results as soon as they are available. The fastest way to get your results is to activate your My Chart account. Instructions are located on the last page of this paperwork. If you have not heard from us regarding the results in 2 weeks, please contact this office.      

## 2015-08-12 ENCOUNTER — Telehealth: Payer: Self-pay | Admitting: Neurology

## 2015-08-12 NOTE — Telephone Encounter (Signed)
Patient would like EEG mailed to her.

## 2015-08-12 NOTE — Telephone Encounter (Signed)
Pt wants the test results please call [phone]

## 2015-09-10 ENCOUNTER — Encounter: Payer: 59 | Admitting: Family Medicine

## 2015-09-28 ENCOUNTER — Encounter: Payer: Self-pay | Admitting: Family Medicine

## 2015-10-20 ENCOUNTER — Ambulatory Visit (INDEPENDENT_AMBULATORY_CARE_PROVIDER_SITE_OTHER): Payer: PPO | Admitting: Family Medicine

## 2015-10-20 VITALS — BP 110/76 | HR 84 | Temp 97.8°F | Resp 16 | Ht 58.5 in | Wt 160.0 lb

## 2015-10-20 DIAGNOSIS — M51369 Other intervertebral disc degeneration, lumbar region without mention of lumbar back pain or lower extremity pain: Secondary | ICD-10-CM

## 2015-10-20 DIAGNOSIS — Z760 Encounter for issue of repeat prescription: Secondary | ICD-10-CM | POA: Diagnosis not present

## 2015-10-20 DIAGNOSIS — M79671 Pain in right foot: Secondary | ICD-10-CM

## 2015-10-20 DIAGNOSIS — M797 Fibromyalgia: Secondary | ICD-10-CM

## 2015-10-20 DIAGNOSIS — F250 Schizoaffective disorder, bipolar type: Secondary | ICD-10-CM | POA: Diagnosis not present

## 2015-10-20 DIAGNOSIS — E038 Other specified hypothyroidism: Secondary | ICD-10-CM

## 2015-10-20 DIAGNOSIS — M79672 Pain in left foot: Secondary | ICD-10-CM

## 2015-10-20 DIAGNOSIS — E034 Atrophy of thyroid (acquired): Secondary | ICD-10-CM | POA: Diagnosis not present

## 2015-10-20 DIAGNOSIS — F310 Bipolar disorder, current episode hypomanic: Secondary | ICD-10-CM

## 2015-10-20 DIAGNOSIS — Z8669 Personal history of other diseases of the nervous system and sense organs: Secondary | ICD-10-CM | POA: Diagnosis not present

## 2015-10-20 DIAGNOSIS — M5136 Other intervertebral disc degeneration, lumbar region: Secondary | ICD-10-CM | POA: Diagnosis not present

## 2015-10-20 DIAGNOSIS — M4316 Spondylolisthesis, lumbar region: Secondary | ICD-10-CM | POA: Diagnosis not present

## 2015-10-20 DIAGNOSIS — R4189 Other symptoms and signs involving cognitive functions and awareness: Secondary | ICD-10-CM

## 2015-10-20 DIAGNOSIS — F329 Major depressive disorder, single episode, unspecified: Secondary | ICD-10-CM | POA: Diagnosis not present

## 2015-10-20 DIAGNOSIS — F32A Depression, unspecified: Secondary | ICD-10-CM

## 2015-10-20 NOTE — Patient Instructions (Addendum)
     IF you received an x-ray today, you will receive an invoice from [facility]. Please contact [facility] at [phone] with questions or concerns regarding your invoice.   IF you received labwork today, you will receive an invoice from [facility]. Please contact [facility] at [phone] with questions or concerns regarding your invoice.   Our billing staff will not be able to assist you with questions regarding bills from these companies.  You will be contacted with the lab results as soon as they are available. The fastest way to get your results is to activate your My Chart account. Instructions are located on the last page of this paperwork. If you have not heard from us regarding the results in 2 weeks, please contact this office.     We recommend that you schedule a mammogram for breast cancer screening. Typically, you do not need a referral to do this. Please contact a local imaging center to schedule your mammogram.  [facility] - [phone]  *ask for the Radiology Department [facility]) - [phone] or [phone]  [facility] - [phone] [facility] - [phone] [facility] - [phone]  *ask for the Radiology Department [facility] - [phone]  *ask for the Radiology Department [facility] - [phone]  *ask for the Mammography Department [facility] - [phone]  
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
## 2015-10-21 LAB — CBC WITH DIFFERENTIAL/PLATELET
Basophils Absolute: 0 cells/uL (ref 0–200)
Basophils Relative: 0 %
EOS PCT: 3 %
Eosinophils Absolute: 168 cells/uL (ref 15–500)
HCT: 46.2 % — ABNORMAL HIGH (ref 35.0–45.0)
Hemoglobin: 15.4 g/dL (ref 11.7–15.5)
LYMPHS PCT: 34 %
Lymphs Abs: 1904 cells/uL (ref 850–3900)
MCH: 28.6 pg (ref 27.0–33.0)
MCHC: 33.3 g/dL (ref 32.0–36.0)
MCV: 85.9 fL (ref 80.0–100.0)
MPV: 10.6 fL (ref 7.5–12.5)
Monocytes Absolute: 392 cells/uL (ref 200–950)
Monocytes Relative: 7 %
NEUTROS PCT: 56 %
Neutro Abs: 3136 cells/uL (ref 1500–7800)
PLATELETS: 228 10*3/uL (ref 140–400)
RBC: 5.38 MIL/uL — AB (ref 3.80–5.10)
RDW: 14.2 % (ref 11.0–15.0)
WBC: 5.6 10*3/uL (ref 3.8–10.8)

## 2015-10-21 LAB — COMPREHENSIVE METABOLIC PANEL
ALT: 14 U/L (ref 6–29)
AST: 15 U/L (ref 10–35)
Albumin: 4.3 g/dL (ref 3.6–5.1)
Alkaline Phosphatase: 100 U/L (ref 33–130)
BILIRUBIN TOTAL: 0.3 mg/dL (ref 0.2–1.2)
BUN: 20 mg/dL (ref 7–25)
CO2: 25 mmol/L (ref 20–31)
CREATININE: 0.73 mg/dL (ref 0.50–1.05)
Calcium: 9.4 mg/dL (ref 8.6–10.4)
Chloride: 106 mmol/L (ref 98–110)
GLUCOSE: 97 mg/dL (ref 65–99)
Potassium: 4.4 mmol/L (ref 3.5–5.3)
SODIUM: 140 mmol/L (ref 135–146)
Total Protein: 6.2 g/dL (ref 6.1–8.1)

## 2015-10-21 LAB — T4, FREE: FREE T4: 1 ng/dL (ref 0.8–1.8)

## 2015-10-21 LAB — VITAMIN B12: VITAMIN B 12: 380 pg/mL (ref 200–1100)

## 2015-10-21 LAB — TSH: TSH: 4.01 mIU/L

## 2015-10-22 LAB — VITAMIN D 25 HYDROXY (VIT D DEFICIENCY, FRACTURES): VIT D 25 HYDROXY: 26 ng/mL — AB (ref 30–100)

## 2015-11-05 ENCOUNTER — Encounter: Payer: Self-pay | Admitting: Neurology

## 2015-11-05 ENCOUNTER — Ambulatory Visit (INDEPENDENT_AMBULATORY_CARE_PROVIDER_SITE_OTHER): Payer: Self-pay | Admitting: Neurology

## 2015-11-05 VITALS — BP 110/70 | HR 73 | Ht 58.5 in | Wt 166.2 lb

## 2015-11-05 DIAGNOSIS — D329 Benign neoplasm of meninges, unspecified: Secondary | ICD-10-CM | POA: Diagnosis not present

## 2015-11-05 DIAGNOSIS — Z8661 Personal history of infections of the central nervous system: Secondary | ICD-10-CM

## 2015-11-05 DIAGNOSIS — R4189 Other symptoms and signs involving cognitive functions and awareness: Secondary | ICD-10-CM

## 2015-11-05 DIAGNOSIS — Z8619 Personal history of other infectious and parasitic diseases: Secondary | ICD-10-CM

## 2015-11-05 DIAGNOSIS — R200 Anesthesia of skin: Secondary | ICD-10-CM

## 2015-11-05 DIAGNOSIS — R208 Other disturbances of skin sensation: Secondary | ICD-10-CM

## 2015-11-05 NOTE — Progress Notes (Signed)
Follow-up Visit   Date: 11/05/2015   Emily Steele MRN: [id number] DOB: 1955-05-05   Interim History: Emily Steele is a 60 y.o. right-handed Caucasian female with bipolar disorder, schizoaffective disorder, severe spinal stenosis of the lumbar spine s/p lumbar decompression at L4-5 (2015), fibromyalgia, depression, GERD, REM behavior disorder, and history of viral encephalitis (2014) returning to the clinic for follow-up of right foot paresthesias.  The patient was accompanied to the clinic by self.  History of present illness: She had one episode of altered mental status in May 2014 where she was hospitalized for suspected viral encephalitis.  Work-up showed CSF pleoocytosis (13 WBC) abnormality involving the right temporal lobe raising the possibility of HSV, along with possible meningioma.  She was also being treated for pneumonia and UTI and it was thought that her encephalopathy may be been due to systemic infection, in addition to encephalitis.  Imaging was discussed with Dr. Vertell Limber who would only recommend resection of the meningioma if she started developing symptoms from this.  Her most recent MRI brain was performed in June 15th 2016 which shows chronic right temporal lobe and right > left insular atrophy, chronic but progressed from 2014, does seem most compatible with herpes encephalitis.  Posterior right temporal lobe region meningioma has minimally enlarged from 2014.  Since this incident, she had noticed problems with memory which has slowly improved but not resolved. She is more concerned with short-term memory, such as forgetting how to get to places and gets lost, misplacing things (i.e. glasses), forgetting appointments.   She lives alone and has burned a few pots because she forgot that the stove was on.  She manages her own IADLs and uses a pill box otherwise she will forget the medications .  She always prints the directions when going somewhere.   She has not been  involved in any car accidents.   She is having a few falls, but is also wearing a boot because of of right plantar fascitis.  Additionally, learning new tasks is very challenging for her.   She also complains of many other issues such as not seeing a dentist in the past year, pain related to fibromyalgia, mood issues which are beyond the scope of this office visit.   Starting experiencing flu-like symptoms in the Spring of 2016 which was similar to what she had experienced previously so became concerned.  At night time, she often hears a radio, but cannot find anyone where the sound is coming frome.  It occurs about 3-4 times per week, lasting up to 30-min.  No associated alteration of consciousness or change in behavior.  She questioned whether she has auditory hallucinations, so was referred to neurology.  She has a history of schizoaffective disorder and bipolar disorder and sees Dr. Chucky May.     UPDATE 11/05/2015:  Patient was last seen in the clinic on 01/07/2015 for cognitive changes and auditory hallucinations.  She underwent routine and ambulatory EEG which did not show any epileptiform activity and did not correlate with her auditory spells.  She has known meningioma which was stable.  US carotids did not show any significant narrowing.  Today, she presents with right foot paresthesias.  She complains of right achy pain involving the sole of the foot which is worse with weight bearing.  She has been seeing Dr. Paulla Dolly who has been managing her for plantar fasciitis.  She has been using a brace and foot vibrator which has significantly helped. She also  complains of tingling and numbness of the tips of the right toe. It is not present on the left foot.   She also ha been not been seeing her providers due to financial costs and also stopped taking her antipsychotic medication in February 2017.  She is forgetting things more, she looses time and reports forgetting 2-days.  She is depressed and spends  all her day in bed.  She reports unable to sleep sometimes, being awake for 2 days straight.  As we are talking about her memory, she randomly and suddenly states "I need to cut my boobs off so I will weigh less".  This caught me off guard as we were discussing her memory.  She attributes weight to her foot pain and her reasoning for stopping her psychiatric medications is to help with her weight.    She talks excessively about chronic medical conditions, which does not pertain to her visit today and despite trying to redirect her to neurological care, she continues to talk about various unrelated issues.  She also used profanity when talking about her medical conditions, which I respectfully asked for her to avoid using.   Medications:  Current Outpatient Prescriptions on File Prior to Visit  Medication Sig Dispense Refill  . amphetamine-dextroamphetamine (ADDERALL XR) 30 MG 24 hr capsule Take 30 mg by mouth 2 (two) times daily before a meal. Reported on 11/05/2015    . ampicillin (PRINCIPEN) 500 MG capsule Take 1 capsule (500 mg total) by mouth 2 (two) times daily. (Patient not taking: Reported on 08/04/2015) 60 capsule 3  . buPROPion (WELLBUTRIN) 100 MG tablet Take 1 tablet (100 mg total) by mouth 3 (three) times daily. (Patient not taking: Reported on 10/20/2015) 90 tablet 3  . cholecalciferol (VITAMIN D) 1000 UNITS tablet Take 1,000 Units by mouth daily. Reported on 11/05/2015    . clindamycin (CLEOCIN T) 1 % external solution Apply 1 application topically 2 (two) times daily as needed (dermatits). (Patient not taking: Reported on 08/04/2015) 60 mL 1  . diclofenac (FLECTOR) 1.3 % PTCH Place 1 patch onto the skin 2 (two) times daily. Reported on 11/05/2015    . diclofenac sodium (VOLTAREN) 1 % GEL Apply topically 4 (four) times daily. Reported on 11/05/2015    . docusate sodium (COLACE) 100 MG capsule Take 100 mg by mouth at bedtime as needed for mild constipation. Reported on 11/05/2015    . DULoxetine  (CYMBALTA) 60 MG capsule Take 60 mg by mouth 2 (two) times daily. Reported on 11/05/2015    . gabapentin (NEURONTIN) 300 MG capsule Take 300 mg by mouth 3 (three) times daily. Reported on 11/05/2015    . Iron Polysacch Cmplx-B12-FA 150-0.025-1 MG CAPS Take 1 capsule daily with a meal and orange juice. (Patient not taking: Reported on 08/04/2015) 90 each 1  . levothyroxine (SYNTHROID, LEVOTHROID) 50 MCG tablet Take 1 tablet (50 mcg total) by mouth daily. (Patient not taking: Reported on 10/20/2015) 30 tablet 5  . lidocaine (LIDODERM) 5 % Place 1 patch onto the skin daily. Reported on 11/05/2015    . metaxalone (SKELAXIN) 800 MG tablet Take 800 mg by mouth 3 (three) times daily. Reported on 11/05/2015    . nitrofurantoin, macrocrystal-monohydrate, (MACROBID) 100 MG capsule Take 1 capsule (100 mg total) by mouth 2 (two) times daily. (Patient not taking: Reported on 10/20/2015) 20 capsule 0  . nystatin (MYCOSTATIN) 100000 UNIT/ML suspension Take 5 mLs (500,000 Units total) by mouth 4 (four) times daily. (Patient not taking: Reported on 08/04/2015)  150 mL 0  . oxyCODONE-acetaminophen (PERCOCET/ROXICET) 5-325 MG per tablet Take 1-2 tablets by mouth every 6 (six) hours as needed for severe pain. Reported on 11/05/2015    . ranitidine (ZANTAC) 150 MG capsule take 1 capsule by mouth twice a day (Patient not taking: Reported on 08/04/2015) 60 capsule 11  . temazepam (RESTORIL) 15 MG capsule Take 2-3 capsules (30-45 mg total) by mouth at bedtime. 2 tablets one day, next day 3 tabs, then 2 tabs again. Alternate between 2-3 tabs at bedtime. (Patient not taking: Reported on 08/04/2015) 30 capsule 0  . tretinoin (RETIN-A) 0.05 % cream Apply 1 application topically at bedtime. (Patient not taking: Reported on 08/04/2015) 45 g 5  . valACYclovir (VALTREX) 500 MG tablet Take 1 tablet twice a day. (Patient not taking: Reported on 08/04/2015) 60 tablet 11   Current Facility-Administered Medications on File Prior to Visit  Medication  Dose Route Frequency Provider Last Rate Last Dose  . triamcinolone acetonide (KENALOG) 10 MG/ML injection 10 mg  10 mg Other Once Wallene Huh, DPM        Allergies:  Allergies  Allergen Reactions  . Doxycycline Other (See Comments)    headaches  . Sulfa Antibiotics     Headaches  . Tape Itching  . Methocarbamol Nausea Only    headaches    Review of Systems:  CONSTITUTIONAL: No fevers, chills, night sweats, or weight loss.  EYES: No visual changes or eye pain ENT: No hearing changes.  No history of nose bleeds.   RESPIRATORY: No cough, wheezing and shortness of breath.   CARDIOVASCULAR: Negative for chest pain, and palpitations.   GI: Negative for abdominal discomfort, blood in stools or black stools.  No recent change in bowel habits.   GU:  No history of incontinence.   MUSCLOSKELETAL: +history of joint pain or swelling.  +myalgias.   SKIN: Negative for lesions, rash, and itching.   ENDOCRINE: Negative for cold or heat intolerance, polydipsia or goiter.   PSYCH:  + depression +anxiety symptoms.   NEURO: As Above.   Vital Signs:  BP 110/70 mmHg  Pulse 73  Ht 4' 10.5" (1.486 m)  Wt 166 lb 3 oz (75.382 kg)  BMI 34.14 kg/m2  SpO2 98%  Neurological Exam: MENTAL STATUS including orientation to time, place, person, recent and remote memory is intact.  Attention span and concentration is very scattered.  Speech is not dysarthric.  CRANIAL NERVES:  Face is symmetric.   MOTOR:  Motor strength is 5/5 in all extremities  MSRs:  Reflexes are 2+/4 throughout, except diminished at the ankles.  SENSORY:  Intact to vibration, temperature, and pin prick throughout.  COORDINATION/GAIT:   Gait narrow based and stable, she is wearing ankle brace as support  Data: MRI brain wwo contrast 09/30/2014: 1.  No acute intracranial pathology. 2.  Chronic right temporal lobe and right > left insular atrophy, chronic but progressed from 2014, does seem most compatible with herpes  encephalitis.   3.  Small superimposed posterior right temporal lobe region meningioma has minimally enlarged from 2014.  No associated edema.  MRI brain wwo contrast 02/20/2013:  Progression of right temporal lobe atrophy in comparison with May 2014.  Given the previousl T2 hyperintensity in this location in a nonvascular distribution, findings most consistent with post infectious sequela of HSV encephalitis.   Slight increase size incidental right temporal meningioma, now 16 x 17 x 48mm.  MRI brain 08/21/2012   1.  Diffuse encephalomalacia of the right  temporal lobe.  The HSV PCR is negative.  This may represent a remote injury due to herpes simplex.  A more typical viral encephalitis is still considered. Abnormal white cells with a lymphocytic predominance are noted in the CSF. 2.  Diffuse white matter signal in the right temporal lobe may be chronic. 3.  Abnormal signal along the insular cortex bilaterally is compatible with encephalitis.  Active disease remains a concern. 4.  No evidence for acute infarct or hemorrhage. 5.  The solid enhancing lesion is more clearly extra-axial and compatible with a meningioma. 6.  Diffuse sinus disease.  Ambulatory EEG 02/10/2015: This 24-hour ambulatory EEG study is abnormal due to occasional focal slowing over the right mid-temporal region. Episodes of hearing music did not show any epileptiform correlate.  US carotids [id number]:   LICA [id number] No significant narrowing on the right  Lab Results  Component Value Date   TSH 4.01 10/20/2015   Lab Results  Component Value Date   HGBA1C 5.4 07/10/2014    IMPRESSION/PLAN: Ms. Fradette is a 60 year-old female with history of viral encephalitis (2014) and known left temporal meningioma returning for with new complaints of right foot paresthesias.  She has plantar fasciitis involving the same foot and now complaints of numbness of the toes. I suspect she has early signs of neuropathy.  NCS/EMG will be  ordered.  She has no history of diabetes, alcoholism, or family history of neuropathy so this is most likely idiopathic.  She was previously seen for auditory spells and memory loss.  Because she had temporal and insular involvement with her prior encephalitis, she underwent extended EEG monitoring which did not show epileptiform discharges, only focal slowing in this region.  With regards to her memory, this is most likely multifactorial and contributed by her schizoaffective disorder as well as impairment which can be post-infectious.  Neuropsychological testing to better characterize the nature of her cognitive impairment was suggested, but she declined testing.  MRI brain shows small posterior right temporal lobe meningioma.  Repeat MRI brain will be ordered to reassess.  She certainly needs to see a psychiatrist and get back on her antipsychotic medications.  She has features of mania, which she denies, as well as depression.   The duration of this appointment visit was 30 minutes of face-to-face time with the patient.  Greater than 50% of this time was spent in counseling, explanation of diagnosis, planning of further management, and coordination of care.   Thank you for allowing me to participate in patient's care.  If I can answer any additional questions, I would be pleased to do so.    Sincerely,    Benedict Kue K. Posey Pronto, DO

## 2015-11-08 ENCOUNTER — Other Ambulatory Visit: Payer: Self-pay | Admitting: *Deleted

## 2015-11-08 DIAGNOSIS — R4182 Altered mental status, unspecified: Secondary | ICD-10-CM

## 2015-11-08 DIAGNOSIS — G0490 Encephalitis and encephalomyelitis, unspecified: Secondary | ICD-10-CM

## 2015-11-08 DIAGNOSIS — D329 Benign neoplasm of meninges, unspecified: Secondary | ICD-10-CM

## 2015-11-08 NOTE — Addendum Note (Signed)
Addended by: Alda Berthold on: 11/08/2015 10:44 AM   Modules accepted: Orders

## 2015-11-09 ENCOUNTER — Telehealth: Payer: Self-pay | Admitting: Neurology

## 2015-11-09 NOTE — Telephone Encounter (Signed)
Pt called to make sure her MRI was being worked on for prior approval.  She was on the phone with her insurance and was told they hadn't gotten a request. Please call her once the insurance has approved or not. She will decide whether to have the test if insurance doesn't approve.

## 2015-11-10 NOTE — Telephone Encounter (Signed)
PA request faxed.  Will inform patient when it is approved.

## 2015-11-14 DIAGNOSIS — F250 Schizoaffective disorder, bipolar type: Secondary | ICD-10-CM | POA: Insufficient documentation

## 2015-11-14 DIAGNOSIS — F310 Bipolar disorder, current episode hypomanic: Secondary | ICD-10-CM | POA: Insufficient documentation

## 2015-11-16 ENCOUNTER — Encounter: Payer: Self-pay | Admitting: Neurology

## 2015-11-17 ENCOUNTER — Other Ambulatory Visit: Payer: Self-pay

## 2015-11-30 ENCOUNTER — Ambulatory Visit: Payer: Self-pay | Admitting: Family Medicine

## 2015-12-01 ENCOUNTER — Telehealth: Payer: Self-pay

## 2015-12-01 NOTE — Telephone Encounter (Signed)
Received  refill requests for Levothroxine, Clindamycin 1% cream, Tretinoin 0.05% cream. Spoke with pharm-pt has refills, reported not taking.  New pt for this mail order pharm. Pharm to contact pt and re-request if needed.

## 2015-12-08 ENCOUNTER — Other Ambulatory Visit: Payer: Self-pay

## 2015-12-08 DIAGNOSIS — E039 Hypothyroidism, unspecified: Secondary | ICD-10-CM

## 2015-12-08 MED ORDER — CLINDAMYCIN PHOSPHATE 1 % EX SOLN: 1.0000 "application " | Freq: Two times a day (BID) | CUTANEOUS | 5 refills | Status: DC | PRN

## 2015-12-08 MED ORDER — LEVOTHYROXINE SODIUM 50 MCG PO TABS: 50.0000 ug | ORAL_TABLET | Freq: Every day | ORAL | 1 refills | Status: DC

## 2015-12-08 MED ORDER — TRETINOIN 0.05 % EX CREA: 1.0000 "application " | TOPICAL_CREAM | Freq: Every day | CUTANEOUS | 5 refills | Status: DC

## 2015-12-08 NOTE — Telephone Encounter (Addendum)
Pharm reqs Rf of clindamycin which you Rxd for pt's dermatitis in 02/2015. I don't see any discussion of it at pt's check up in July. OK to give RFs? Also req'd tretinoin and levothyroxine. I will go ahead and OK the levothyroxine per protocol since you did lab at July visit.

## 2015-12-23 ENCOUNTER — Other Ambulatory Visit: Payer: Self-pay | Admitting: Neurosurgery

## 2015-12-23 DIAGNOSIS — M4316 Spondylolisthesis, lumbar region: Secondary | ICD-10-CM

## 2016-01-17 ENCOUNTER — Ambulatory Visit
Admission: RE | Admit: 2016-01-17 | Discharge: 2016-01-17 | Disposition: A | Discharge status: Home / Self Care | Payer: PPO | Source: Ambulatory Visit | Attending: Neurosurgery | Admitting: Neurosurgery

## 2016-01-17 ENCOUNTER — Ambulatory Visit
Admission: RE | Admit: 2016-01-17 | Discharge: 2016-01-17 | Disposition: A | Discharge status: Home / Self Care | Payer: PPO | Source: Ambulatory Visit | Attending: Neurology | Admitting: Neurology

## 2016-01-17 DIAGNOSIS — D329 Benign neoplasm of meninges, unspecified: Secondary | ICD-10-CM

## 2016-01-17 DIAGNOSIS — R4182 Altered mental status, unspecified: Secondary | ICD-10-CM

## 2016-01-17 DIAGNOSIS — M4316 Spondylolisthesis, lumbar region: Secondary | ICD-10-CM

## 2016-01-17 DIAGNOSIS — G0490 Encephalitis and encephalomyelitis, unspecified: Secondary | ICD-10-CM

## 2016-01-17 MED ORDER — GADOBENATE DIMEGLUMINE 529 MG/ML IV SOLN
15.0000 mL | Freq: Once | INTRAVENOUS | Status: AC | PRN
  Administered 2016-01-17: 15 mL via INTRAVENOUS

## 2016-01-21 ENCOUNTER — Telehealth: Payer: Self-pay | Admitting: Neurology

## 2016-01-21 NOTE — Telephone Encounter (Signed)
-----   Message from Alda Berthold, DO sent at 01/17/2016  6:45 PM EDT ----- Please inform patient that her MRI brain is stable, as compared to previous years. There has been no interval progression or new findings.  Thanks.

## 2016-01-21 NOTE — Telephone Encounter (Signed)
Left message on machine for patient to call back.

## 2016-01-24 NOTE — Telephone Encounter (Signed)
Patient made aware MR stable.

## 2016-03-26 ENCOUNTER — Telehealth: Payer: Self-pay | Admitting: Family Medicine

## 2016-03-26 ENCOUNTER — Encounter (HOSPITAL_COMMUNITY): Payer: Self-pay | Admitting: *Deleted

## 2016-03-26 ENCOUNTER — Ambulatory Visit (HOSPITAL_COMMUNITY)
Admission: EM | Admit: 2016-03-26 | Discharge: 2016-03-26 | Disposition: A | Discharge status: Home / Self Care | Payer: PPO | Attending: Family Medicine | Admitting: Family Medicine

## 2016-03-26 DIAGNOSIS — N3000 Acute cystitis without hematuria: Secondary | ICD-10-CM | POA: Diagnosis not present

## 2016-03-26 LAB — POCT URINALYSIS DIP (DEVICE)
GLUCOSE, UA: NEGATIVE mg/dL
Hgb urine dipstick: NEGATIVE
KETONES UR: 15 mg/dL — AB
LEUKOCYTES UA: NEGATIVE
NITRITE: NEGATIVE
PH: 5.5 (ref 5.0–8.0)
Protein, ur: NEGATIVE mg/dL
Specific Gravity, Urine: >=1.03 (ref 1.005–1.030)
Urobilinogen, UA: 0.2 mg/dL (ref 0.0–1.0)

## 2016-03-26 MED ORDER — CEPHALEXIN 500 MG PO CAPS: 500.0000 mg | ORAL_CAPSULE | Freq: Four times a day (QID) | ORAL | 0 refills | Status: DC

## 2016-03-26 NOTE — ED Triage Notes (Signed)
Pt  Reports   Symptoms  Of  Dysuria       And   Frequency       Symptoms    Since   Yesterday

## 2016-03-26 NOTE — Discharge Instructions (Signed)
Take all of medicine as directed, drink lots of fluids, see your doctor if further problems. °

## 2016-03-26 NOTE — Telephone Encounter (Signed)
Received call from answering service at 7:17am today. Pt reporting twelve hour history of UTI symptoms.  A/P: UTI symptoms: recommend urgent care visit or e-visit to patient.  Plans to present to [facility].

## 2016-03-26 NOTE — ED Provider Notes (Signed)
[place]    CSN: [id number] Arrival date & time: 03/26/16  [place]     History   Chief Complaint Chief Complaint  Patient presents with  . Dysuria    HPI Emily Steele is a 60 y.o. female.   The history is provided by the patient.  Dysuria  Pain quality:  Burning Pain severity:  Mild Onset quality:  Gradual Duration:  2 days Progression:  Worsening Chronicity:  Recurrent Recent urinary tract infections: no   Relieved by:  None tried Worsened by:  Nothing Ineffective treatments:  None tried Urinary symptoms: no foul-smelling urine and no frequent urination   Associated symptoms: no abdominal pain, no fever, no flank pain, no nausea and no vomiting     Past Medical History:  Diagnosis Date  . Allergy   . Anemia   . Anxiety   . Arthritis   . Brain tumor ([place])    right temporal, not ca  . Constipation   . DDD (degenerative disc disease)   . Depression   . Eczema   . Fibromyalgia   . Fibromyalgia   . GERD (gastroesophageal reflux disease)   . Hearing loss    1 ear - mild  . Herpes simplex   . Hx of migraine headaches   . Hypertension    not on medications  . Hypothyroid   . Obesity   . Osteoarthritis   . Osteopenia   . Osteoporosis   . Plantar fasciitis   . Pneumonia 08/2012  . Rosacea   . Sleep behavior disorder, REM   . Toenail fungus   . UTI (urinary tract infection)     Patient Active Problem List   Diagnosis Date Noted  . Schizoaffective disorder, bipolar type ([place]) 11/14/2015  . Bipolar affective disorder, current episode hypomanic ([place]) 11/14/2015  . Degenerative disc disease, lumbar 10/20/2015  . History of encephalopathy 10/20/2015  . Family history of breast cancer in first degree relative 04/13/2015  . Anemia, iron deficiency 04/13/2015  . Vitamin D deficiency 04/13/2015  . Rosacea, acne 03/16/2015  . Meningioma ([place]) 01/07/2015  . Cognitive impairment 01/07/2015  . Spondylolisthesis at L4-L5 level 08/18/2013  .  Altered mental status 08/17/2012  . 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 CAP (community acquired pneumonia) 08/17/2012  . Degenerative disc disease 08/13/2011  . H/O vitamin D deficiency 08/13/2011  . Major Depressive Disorder 05/17/2011  . Fibromyalgia 05/17/2011  . Hypothyroidism 05/17/2011  . Sleep behavior disorder, REM 05/17/2011  . Osteopenia 05/17/2011  . Obesity 05/17/2011  . Plantar fasciitis 05/17/2011    Past Surgical History:  Procedure Laterality Date  . ABDOMINAL HYSTERECTOMY    . HAND SURGERY Bilateral    ligimant transfere to thumbs  . LAMINECTOMY  08/18/2013   L 4 L5    DR CRAM  . MAXIMUM ACCESS (MAS)POSTERIOR LUMBAR INTERBODY FUSION (PLIF) 1 LEVEL N/A 08/18/2013   Procedure: FOR MAXIMUM ACCESS (MAS) POSTERIOR LUMBAR INTERBODY FUSION (PLIF) 1 LEVEL;  Surgeon: Elaina Hoops, MD;  Location: [facility];  Service: Neurosurgery;  Laterality: N/A;  FOR MAXIMUM ACCESS (MAS) POSTERIOR LUMBAR INTERBODY FUSION (PLIF) 1 LEVEL L4-5  . SPINE SURGERY     Lamincetomy and Disectomy L5- S1  . TONSILLECTOMY AND ADENOIDECTOMY      OB History    No data available       Home Medications    Prior to Admission medications   Medication Sig Start Date End Date Taking? Authorizing Provider  amphetamine-dextroamphetamine (ADDERALL XR) 30 MG  24 hr capsule Take 30 mg by mouth 2 (two) times daily before a meal. Reported on 11/05/2015    Historical Provider, MD  ampicillin (PRINCIPEN) 500 MG capsule Take 1 capsule (500 mg total) by mouth 2 (two) times daily. Patient not taking: Reported on 08/04/2015 03/12/15   Wardell Honour, MD  buPROPion [facility]) 100 MG tablet Take 1 tablet (100 mg total) by mouth 3 (three) times daily. Patient not taking: Reported on 10/20/2015 08/04/15   Tereasa Coop, PA-C  cholecalciferol (VITAMIN D) 1000 UNITS tablet Take 1,000 Units by mouth daily. Reported on 11/05/2015    Historical Provider, MD  clindamycin (CLEOCIN T) 1 % external solution Apply 1 application  topically 2 (two) times daily as needed (dermatits). 12/08/15   Wardell Honour, MD  diclofenac (FLECTOR) 1.3 % PTCH Place 1 patch onto the skin 2 (two) times daily. Reported on 11/05/2015    Historical Provider, MD  diclofenac sodium (VOLTAREN) 1 % GEL Apply topically 4 (four) times daily. Reported on 11/05/2015    Historical Provider, MD  docusate sodium (COLACE) 100 MG capsule Take 100 mg by mouth at bedtime as needed for mild constipation. Reported on 11/05/2015    Historical Provider, MD  DULoxetine (CYMBALTA) 60 MG capsule Take 60 mg by mouth 2 (two) times daily. Reported on 11/05/2015    Historical Provider, MD  gabapentin (NEURONTIN) 300 MG capsule Take 300 mg by mouth 3 (three) times daily. Reported on 11/05/2015    Historical Provider, MD  Iron Polysacch Cmplx-B12-FA 150-0.025-1 MG CAPS Take 1 capsule daily with a meal and orange juice. Patient not taking: Reported on 08/04/2015 07/10/14   Barton Fanny, MD  levothyroxine (SYNTHROID, LEVOTHROID) 50 MCG tablet Take 1 tablet (50 mcg total) by mouth daily. 12/08/15   Wardell Honour, MD  lidocaine (LIDODERM) 5 % Place 1 patch onto the skin daily. Reported on 11/05/2015    Historical Provider, MD  metaxalone (SKELAXIN) 800 MG tablet Take 800 mg by mouth 3 (three) times daily. Reported on 11/05/2015    Historical Provider, MD  nitrofurantoin, macrocrystal-monohydrate, (MACROBID) 100 MG capsule Take 1 capsule (100 mg total) by mouth 2 (two) times daily. Patient not taking: Reported on 10/20/2015 08/04/15   Tereasa Coop, PA-C  nystatin (MYCOSTATIN) 100000 UNIT/ML suspension Take 5 mLs (500,000 Units total) by mouth 4 (four) times daily. Patient not taking: Reported on 08/04/2015 03/12/15   Wardell Honour, MD  oxyCODONE-acetaminophen (PERCOCET/ROXICET) 5-325 MG per tablet Take 1-2 tablets by mouth every 6 (six) hours as needed for severe pain. Reported on 11/05/2015    Historical Provider, MD  ranitidine (ZANTAC) 150 MG capsule take 1 capsule by mouth twice  a day Patient not taking: Reported on 08/04/2015 03/16/15   Wardell Honour, MD  temazepam (RESTORIL) 15 MG capsule Take 2-3 capsules (30-45 mg total) by mouth at bedtime. 2 tablets one day, next day 3 tabs, then 2 tabs again. Alternate between 2-3 tabs at bedtime. Patient not taking: Reported on 08/04/2015 06/01/15   Jaynee Eagles, PA-C  tretinoin (RETIN-A) 0.05 % cream Apply 1 application topically at bedtime. 12/08/15   Wardell Honour, MD  valACYclovir (VALTREX) 500 MG tablet Take 1 tablet twice a day. Patient not taking: Reported on 08/04/2015 03/12/15   Wardell Honour, MD    Family History Family History  Problem Relation Age of Onset  . Cancer Mother 37    lung cancer  . Hypertension Mother   . Cancer Sister  Breast cancer  . Heart disease Sister   . Asthma Sister   . Asthma Brother   . Drug abuse Brother   . Hypertension Brother     Social History Social History  Substance Use Topics  . Smoking status: Never Smoker  . Smokeless tobacco: Never Used  . Alcohol use No     Allergies   Doxycycline; Sulfa antibiotics; Tape; and Methocarbamol   Review of Systems Review of Systems  Constitutional: Negative.  Negative for fever.  Gastrointestinal: Negative.  Negative for abdominal pain, nausea and vomiting.  Genitourinary: Positive for dysuria, frequency and urgency. Negative for flank pain, menstrual problem and pelvic pain.  All other systems reviewed and are negative.    Physical Exam Triage Vital Signs ED Triage Vitals [03/26/16 1650]  Enc Vitals Group     BP 121/82     Pulse Rate 75     Resp 16     Temp 98.4 F (36.9 C)     Temp Source Oral     SpO2 97 %     Weight      Height      Head Circumference      Peak Flow      Pain Score      Pain Loc      Pain Edu?      Excl. in [place]?    No data found.   Updated Vital Signs BP 121/82 (BP Location: Left Arm)   Pulse 75   Temp 98.4 F (36.9 C) (Oral)   Resp 16   SpO2 97%   Visual Acuity Right Eye  Distance:   Left Eye Distance:   Bilateral Distance:    Right Eye Near:   Left Eye Near:    Bilateral Near:     Physical Exam  Constitutional: She is oriented to person, place, and time. She appears well-developed and well-nourished. No distress.  Abdominal: Soft. Bowel sounds are normal. There is no tenderness.  Neurological: She is alert and oriented to person, place, and time.  Skin: Skin is warm and dry.  Nursing note and vitals reviewed.    UC Treatments / Results  Labs (all labs ordered are listed, but only abnormal results are displayed) Labs Reviewed  POCT URINALYSIS DIP (DEVICE) - Abnormal; Notable for the following:       Result Value   Bilirubin Urine SMALL (*)    Ketones, ur 15 (*)    All other components within normal limits    EKG  EKG Interpretation None       Radiology No results found.  Procedures Procedures (including critical care time)  Medications Ordered in UC Medications - No data to display   Initial Impression / Assessment and Plan / UC Course  I have reviewed the triage vital signs and the nursing notes.  Pertinent labs & imaging results that were available during my care of the patient were reviewed by me and considered in my medical decision making (see chart for details).  Clinical Course       Final Clinical Impressions(s) / UC Diagnoses   Final diagnoses:  None    New Prescriptions New Prescriptions   No medications on file     Billy Fischer, MD 03/26/16 1756

## 2016-04-06 ENCOUNTER — Ambulatory Visit: Payer: PPO | Admitting: Family Medicine

## 2016-04-20 ENCOUNTER — Encounter: Payer: Self-pay | Admitting: Family Medicine

## 2016-04-20 ENCOUNTER — Ambulatory Visit (INDEPENDENT_AMBULATORY_CARE_PROVIDER_SITE_OTHER): Payer: Medicare HMO | Admitting: Family Medicine

## 2016-04-20 VITALS — BP 115/86 | HR 76 | Temp 98.0°F | Resp 16 | Ht 58.5 in | Wt 161.0 lb

## 2016-04-20 DIAGNOSIS — F329 Major depressive disorder, single episode, unspecified: Secondary | ICD-10-CM

## 2016-04-20 DIAGNOSIS — F250 Schizoaffective disorder, bipolar type: Secondary | ICD-10-CM | POA: Diagnosis not present

## 2016-04-20 DIAGNOSIS — G8929 Other chronic pain: Secondary | ICD-10-CM

## 2016-04-20 DIAGNOSIS — M545 Low back pain: Secondary | ICD-10-CM | POA: Diagnosis not present

## 2016-04-20 DIAGNOSIS — B351 Tinea unguium: Secondary | ICD-10-CM

## 2016-04-20 DIAGNOSIS — M5136 Other intervertebral disc degeneration, lumbar region: Secondary | ICD-10-CM | POA: Diagnosis not present

## 2016-04-20 DIAGNOSIS — E039 Hypothyroidism, unspecified: Secondary | ICD-10-CM | POA: Diagnosis not present

## 2016-04-20 DIAGNOSIS — F32A Depression, unspecified: Secondary | ICD-10-CM

## 2016-04-20 MED ORDER — DULOXETINE HCL 30 MG PO CPEP: 30.0000 mg | ORAL_CAPSULE | Freq: Two times a day (BID) | ORAL | 1 refills | Status: DC

## 2016-04-20 MED ORDER — TERBINAFINE HCL 250 MG PO TABS: 250.0000 mg | ORAL_TABLET | Freq: Every day | ORAL | 2 refills | Status: DC

## 2016-04-20 NOTE — Patient Instructions (Addendum)
I will check a thyroid test first to see if you do need to be back on medication.  Restart Cymbalta 30 mg 1 pill once per day, then after 1 week can increase to twice per day. I did also refer you to psychiatry.   Call your neurosurgeon's office to see if they can refer you to physical therapy/water therapy or if you will need a more recent office visit with them to discuss treatment options for your back.  If needed, I can refer you to another back specialist for second opinion as requested or to pain management.   Lamisil once per day for foot fungus, return in 6 weeks for Medicare wellness visit or follow-up visit to recheck liver tests at that time to make sure it is safe to continue for a total of 3 months of medication. As we discussed, sometimes longer courses are needed to treat that condition.  I would recommend ongoing care with neurologist or neurosurgeon regarding menigioma and encephalopathy.  Return to the clinic or go to the nearest emergency room if any of your symptoms worsen or new symptoms occur.   IF you received an x-ray today, you will receive an invoice from [facility]. Please contact [facility] at [phone] with questions or concerns regarding your invoice.   IF you received labwork today, you will receive an invoice from [facility]. Please contact [facility] at [phone] with questions or concerns regarding your invoice.   Our billing staff will not be able to assist you with questions regarding bills from these companies.  You will be contacted with the lab results as soon as they are available. The fastest way to get your results is to activate your My Chart account. Instructions are located on the last page of this paperwork. If you have not heard from [place] regarding the results in 2 weeks, please contact this office.

## 2016-04-20 NOTE — Progress Notes (Signed)
By signing my name below I, Tereasa Coop, attest that this documentation has been prepared under the direction and in the presence of [name], MD. Electonically Signed. Tereasa Coop, Scribe 04/20/2016 at 11:36 AM  Subjective:    Patient ID: Georgann Housekeeper, female    DOB: [birth year], 61 y.o.   MRN: [id number]  Chief Complaint  Patient presents with  . Establish Care    PER PATIENT SHE IS NOT TAKING MEDICATION - SHE WILL DISCUSS  . Nail Problem    LEFT GREAT TOE     HPI SKYLEEN BENTLEY is a 61 y.o. female who presents to the [facility] in order to establish care. Last seen by Dr Tamala Julian in July 2017. Multiple medical problems including fibromyalgia, depression, cognitive impairment from encephalopathy, lumbar DDD.  Pt also c/o toenail fungus.   Pt reports that due to losing health insurance for a year and a half, pt was not being followed for her chronic problems and ran out of her medications. Pt states that when she ran out of her medications she last 35 lbs and her back pain from her DDD improved. Weight loss occurred over a year ago and weight has been stable since.   Lumbar DDD Pt c/o chronic back pain from her DDD. Pt is followed by Dr Saintclair Halsted; neurosurgeon. Spinal fracture 2015. History of bulging disc per notes. Pt reports that Dr Saintclair Halsted was recommending steroid shots and pt declined due to her osteoarthritis. Pt states that she is still in pain and wants to meet with a chiropractor and do water therapy with a physical therapist for her back pain. States water therapy has helped with similar pain in the past. Denies any urinary or bowel incontinence. Pt reports that she takes Neurontin to control back pain, but does not want to start back on that medication at this time.  Pt states that her pain has been worsening over the past 6 months. Pt also reports that she was prescribed a muscle relaxer by Dr Saintclair Halsted that she takes on rare occasion when she exerts her self.    Depression/Bipolar/schizoaffective disorder Off medication since February 2017 at last visit and did report auditory hallucinations at last visit in July 2017. Did not appear to have significant family support at that time. Previously seen by Dr Toy Care; psychiatrist. Pt was clarifying insurance at last visit and pt was planning for follow up visit with psychiatry and establishing care with neurology. Pt now on medicare and Dr Toy Care does not accept medicare pts. Has been treated with Wellbutrin and Cymbalta in the past as well as Restoril. Pt not taking any medications currently because of financial reasons. Pt states she has been depressed recently. Denies auditory hallucinations, thoughts of suicide, harming herself or others.   Paraesthesias and history of viral encephalitis  Followed by neurology for rt foot paraesthesias and history of viral encephalitis in 2014. Per neurology visit in July 2017. Pt has a known meningioma which is stable. History of EEG without seizure activity. For pt's foot paraesthesias, planned for MCS/EMG. Pt declined neuro-psych testing for memory issues. Advised to follow up with psychiatry. Repeat MRI brain on 01/17/16 was stable without apparent progression of previous findings.   Low Vitamin D Pt's Vit D was 26 in July 2017. Recommended OTC supplement.   Hypothyroidism Previously on 50 MCG synthroid QD.  Lab Results  Component Value Date   TSH 4.01 10/20/2015  Pt was not taking any medication in July  2017.      Patient Active Problem List   Diagnosis Date Noted  . Schizoaffective disorder, bipolar type ([place]) 11/14/2015  . Bipolar affective disorder, current episode hypomanic ([place]) 11/14/2015  . Degenerative disc disease, lumbar 10/20/2015  . History of encephalopathy 10/20/2015  . Family history of breast cancer in first degree relative 04/13/2015  . Anemia, iron deficiency 04/13/2015  . Vitamin D deficiency 04/13/2015  . Rosacea, acne 03/16/2015  .  Meningioma ([place]) 01/07/2015  . Cognitive impairment 01/07/2015  . Spondylolisthesis at L4-L5 level 08/18/2013  . Altered mental status 08/17/2012  . 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 CAP (community acquired pneumonia) 08/17/2012  . Degenerative disc disease 08/13/2011  . H/O vitamin D deficiency 08/13/2011  . Major Depressive Disorder 05/17/2011  . Fibromyalgia 05/17/2011  . Hypothyroidism 05/17/2011  . Sleep behavior disorder, REM 05/17/2011  . Osteopenia 05/17/2011  . Obesity 05/17/2011  . Plantar fasciitis 05/17/2011   Past Medical History:  Diagnosis Date  . Allergy   . Anemia   . Anxiety   . Arthritis   . Brain tumor ([place])    right temporal, not ca  . Constipation   . DDD (degenerative disc disease)   . Depression   . Eczema   . Fibromyalgia   . Fibromyalgia   . GERD (gastroesophageal reflux disease)   . Hearing loss    1 ear - mild  . Herpes simplex   . Hx of migraine headaches   . Hypertension    not on medications  . Hypothyroid   . Obesity   . Osteoarthritis   . Osteopenia   . Osteoporosis   . Plantar fasciitis   . Pneumonia 08/2012  . Rosacea   . Sleep behavior disorder, REM   . Toenail fungus   . UTI (urinary tract infection)    Past Surgical History:  Procedure Laterality Date  . ABDOMINAL HYSTERECTOMY    . HAND SURGERY Bilateral    ligimant transfere to thumbs  . LAMINECTOMY  08/18/2013   L 4 L5    DR CRAM  . MAXIMUM ACCESS (MAS)POSTERIOR LUMBAR INTERBODY FUSION (PLIF) 1 LEVEL N/A 08/18/2013   Procedure: FOR MAXIMUM ACCESS (MAS) POSTERIOR LUMBAR INTERBODY FUSION (PLIF) 1 LEVEL;  Surgeon: Elaina Hoops, MD;  Location: [place] [facility];  Service: Neurosurgery;  Laterality: N/A;  FOR MAXIMUM ACCESS (MAS) POSTERIOR LUMBAR INTERBODY FUSION (PLIF) 1 LEVEL L4-5  . SPINE SURGERY     Lamincetomy and Disectomy L5- S1  . TONSILLECTOMY AND ADENOIDECTOMY     Allergies  Allergen Reactions  . Doxycycline Other (See Comments)    headaches  . Sulfa Antibiotics      Headaches  . Tape Itching  . Methocarbamol Nausea Only    headaches   Prior to Admission medications   Medication Sig Start Date End Date Taking? Authorizing Provider  amphetamine-dextroamphetamine (ADDERALL XR) 30 MG 24 hr capsule Take 30 mg by mouth 2 (two) times daily before a meal. Reported on 11/05/2015    Historical Provider, MD  buPROPion (WELLBUTRIN) 100 MG tablet Take 1 tablet (100 mg total) by mouth 3 (three) times daily. Patient not taking: Reported on 04/20/2016 08/04/15   Tereasa Coop, PA-C  cholecalciferol (VITAMIN D) 1000 UNITS tablet Take 1,000 Units by mouth daily. Reported on 11/05/2015    Historical Provider, MD  clindamycin (CLEOCIN T) 1 % external solution Apply 1 application topically 2 (two) times daily as needed (dermatits). Patient not taking: Reported on 04/20/2016 12/08/15   Renette Butters  Tamala Julian, MD  diclofenac (FLECTOR) 1.3 % PTCH Place 1 patch onto the skin 2 (two) times daily. Reported on 11/05/2015    Historical Provider, MD  diclofenac sodium (VOLTAREN) 1 % GEL Apply topically 4 (four) times daily. Reported on 11/05/2015    Historical Provider, MD  docusate sodium (COLACE) 100 MG capsule Take 100 mg by mouth at bedtime as needed for mild constipation. Reported on 11/05/2015    Historical Provider, MD  DULoxetine (CYMBALTA) 60 MG capsule Take 60 mg by mouth 2 (two) times daily. Reported on 11/05/2015    Historical Provider, MD  gabapentin (NEURONTIN) 300 MG capsule Take 300 mg by mouth 3 (three) times daily. Reported on 11/05/2015    Historical Provider, MD  Iron Polysacch Cmplx-B12-FA 150-0.025-1 MG CAPS Take 1 capsule daily with a meal and orange juice. Patient not taking: Reported on 04/20/2016 07/10/14   Barton Fanny, MD  levothyroxine (SYNTHROID, LEVOTHROID) 50 MCG tablet Take 1 tablet (50 mcg total) by mouth daily. Patient not taking: Reported on 04/20/2016 12/08/15   Wardell Honour, MD  lidocaine (LIDODERM) 5 % Place 1 patch onto the skin daily. Reported on 11/05/2015     Historical Provider, MD  metaxalone (SKELAXIN) 800 MG tablet Take 800 mg by mouth 3 (three) times daily. Reported on 11/05/2015    Historical Provider, MD  nitrofurantoin, macrocrystal-monohydrate, (MACROBID) 100 MG capsule Take 1 capsule (100 mg total) by mouth 2 (two) times daily. Patient not taking: Reported on 04/20/2016 08/04/15   Tereasa Coop, PA-C  nystatin (MYCOSTATIN) 100000 UNIT/ML suspension Take 5 mLs (500,000 Units total) by mouth 4 (four) times daily. Patient not taking: Reported on 04/20/2016 03/12/15   Wardell Honour, MD  oxyCODONE-acetaminophen (PERCOCET/ROXICET) 5-325 MG per tablet Take 1-2 tablets by mouth every 6 (six) hours as needed for severe pain. Reported on 11/05/2015    Historical Provider, MD  ranitidine (ZANTAC) 150 MG capsule take 1 capsule by mouth twice a day Patient not taking: Reported on 04/20/2016 03/16/15   Wardell Honour, MD  temazepam (RESTORIL) 15 MG capsule Take 2-3 capsules (30-45 mg total) by mouth at bedtime. 2 tablets one day, next day 3 tabs, then 2 tabs again. Alternate between 2-3 tabs at bedtime. Patient not taking: Reported on 04/20/2016 06/01/15   Jaynee Eagles, PA-C  tretinoin (RETIN-A) 0.05 % cream Apply 1 application topically at bedtime. Patient not taking: Reported on 04/20/2016 12/08/15   Wardell Honour, MD  valACYclovir (VALTREX) 500 MG tablet Take 1 tablet twice a day. Patient not taking: Reported on 04/20/2016 03/12/15   Wardell Honour, MD   Social History   Social History  . Marital status: Single    Spouse name: N/A  . Number of children: 0  . Years of education: college   Occupational History  .  Other    n/a   Social History Main Topics  . Smoking status: Never Smoker  . Smokeless tobacco: Never Used  . Alcohol use No  . Drug use: No  . Sexual activity: No   Other Topics Concern  . Not on file   Social History Narrative   Patient lives at home alone.   Caffeine Use: 3-4 cups weekly   Single. Education: [employer].  Exercises 4x's  a week.      Review of Systems  Constitutional: Negative for fatigue and unexpected weight change.  Respiratory: Negative for chest tightness and shortness of breath.   Cardiovascular: Negative for chest pain, palpitations and leg swelling.  Gastrointestinal: Negative for abdominal pain and blood in stool.  Musculoskeletal: Positive for back pain (chronic).  Skin:       Positive for toe fungus  Neurological: Negative for dizziness, syncope, light-headedness and headaches.  Psychiatric/Behavioral: Negative for hallucinations and suicidal ideas.       Positive for depression       Objective:   Physical Exam  Constitutional: She is oriented to person, place, and time. She appears well-developed and well-nourished.  HENT:  Head: Normocephalic and atraumatic.  Eyes: Conjunctivae and EOM are normal. Pupils are equal, round, and reactive to light.  Neck: Carotid bruit is not present.  Cardiovascular: Normal rate, regular rhythm, normal heart sounds and intact distal pulses.   Pulmonary/Chest: Effort normal and breath sounds normal.  Abdominal: Soft. She exhibits no pulsatile midline mass. There is no tenderness.  Musculoskeletal: She exhibits no edema.  Back exam: Flexion to 80 degrees. Minimal lateral flexion, equal bilat Full and equal rotation bilat.  Neurological: She is alert and oriented to person, place, and time.  Reflex Scores:      Patellar reflexes are 2+ on the right side and 2+ on the left side.      Achilles reflexes are 2+ on the right side and 2+ on the left side. Able to heel and toe walk without difficulty. Negative seated straight leg raise bilat.  Skin: Skin is warm and dry.  Thick and discolored toe nails on the rt 1st and 5th toes and left 1st and 2nd toes. Lateral nail folds appear normal with no discharge.   Psychiatric: She has a normal mood and affect. Her behavior is normal.  Vitals reviewed.    Vitals:   04/20/16 1025  BP: 115/86  Pulse: 76   Resp: 16  Temp: 98 F (36.7 C)  TempSrc: Oral  SpO2: 97%  Weight: 161 lb (73 kg)  Height: 4' 10.5" (1.486 m)        Assessment & Plan:   NATALLIE RAVENSCROFT is a 61 y.o. female Chronic low back pain, unspecified back pain laterality, with sciatica presence unspecified - Plan: Care order/instruction:DDD (degenerative disc disease), lumbar  - Recommended discussing plan with her neurosurgeon as she would like to try physical therapy/water therapy and chiropractic care first. Declined Neurontin refill this time. If persistent pain, consider pain management or possible second opinion as discussed initially.  Schizoaffective disorder, bipolar type ([place]) - Plan: Ambulatory referral to Psychiatry Depression, unspecified depression type - Plan: Ambulatory referral to Psychiatry, DULoxetine (CYMBALTA) 30 MG capsule  -On multiple medications previously, medication nonadherence due to financial constraints but now on Medicare with better coverage. Recommended ongoing care with psychiatry, but did agree to restart Cymbalta as that may also help with pain. Denies previous history of mood stabilizer, does not appear to be manic in office today and denies recent a/V/T hallucinations or delusions. No SI/HI. ER/RTC precautions discussed, will start Cymbalta, follow-up in 6 weeks, and referred to psychiatry.  Hypothyroidism, unspecified type - Plan: TSH  - TSH normal last year when she was off of medication. Will check baseline TSH prior to refilling medication at this time.  Onychomycosis - Plan: terbinafine (LAMISIL) 250 MG tablet, CMP14+EGFR  -Multiple nails on both feet involved. Various options discussed including Lamisil by mouth with possible need for prolonged course versus Jublia topical. Also discussed possibility of persistent symptoms in spite of treatment. Monitoring discussed with oral medication. She chose to try Lamisil.  -Start Lamisil 250 mg daily, check baseline liver function  testing,  recheck testing in 6 weeks. PLan for minimum 12 weeks medication  Meds ordered this encounter  Medications  . DULoxetine (CYMBALTA) 30 MG capsule    Sig: Take 1 capsule (30 mg total) by mouth 2 (two) times daily. Reported on 11/05/2015    Dispense:  60 capsule    Refill:  1  . terbinafine (LAMISIL) 250 MG tablet    Sig: Take 1 tablet (250 mg total) by mouth daily.    Dispense:  30 tablet    Refill:  2   Patient Instructions   I will check a thyroid test first to see if you do need to be back on medication.  Restart Cymbalta 30 mg 1 pill once per day, then after 1 week can increase to twice per day. I did also refer you to psychiatry.   Call your neurosurgeon's office to see if they can refer you to physical therapy/water therapy or if you will need a more recent office visit with them to discuss treatment options for your back.  If needed, I can refer you to another back specialist for second opinion as requested or to pain management.   Lamisil once per day for foot fungus, return in 6 weeks for Medicare wellness visit or follow-up visit to recheck liver tests at that time to make sure it is safe to continue for a total of 3 months of medication. As we discussed, sometimes longer courses are needed to treat that condition.  I would recommend ongoing care with neurologist or neurosurgeon regarding menigioma and encephalopathy.  Return to the clinic or go to the nearest emergency room if any of your symptoms worsen or new symptoms occur.   IF you received an x-ray today, you will receive an invoice from [facility]. Please contact [facility] at [phone] with questions or concerns regarding your invoice.   IF you received labwork today, you will receive an invoice from [facility]. Please contact [facility] at [phone] with questions or concerns regarding your invoice.   Our billing staff will not be able to assist you with questions regarding bills from these  companies.  You will be contacted with the lab results as soon as they are available. The fastest way to get your results is to activate your My Chart account. Instructions are located on the last page of this paperwork. If you have not heard from [place] regarding the results in 2 weeks, please contact this office.       I personally performed the services described in this documentation, which was scribed in my presence. The recorded information has been reviewed and considered, and addended by me as needed.   Signed,   [name], MD [facility] [place].  04/20/16 2:37 PM

## 2016-04-21 LAB — CMP14+EGFR
ALBUMIN: 4.5 g/dL (ref 3.5–5.5)
ALT: 16 IU/L (ref 0–32)
AST: 18 IU/L (ref 0–40)
Albumin/Globulin Ratio: 2.1 (ref 1.2–2.2)
Alkaline Phosphatase: 107 IU/L (ref 39–117)
BILIRUBIN TOTAL: 0.3 mg/dL (ref 0.0–1.2)
BUN / CREAT RATIO: 19 (ref 9–23)
BUN: 14 mg/dL (ref 6–24)
CHLORIDE: 103 mmol/L (ref 96–106)
CO2: 23 mmol/L (ref 18–29)
CREATININE: 0.74 mg/dL (ref 0.57–1.00)
Calcium: 9.8 mg/dL (ref 8.7–10.2)
GFR calc non Af Amer: 89 mL/min/{1.73_m2} (ref >59)
GFR, EST AFRICAN AMERICAN: 103 mL/min/{1.73_m2} (ref >59)
GLUCOSE: 90 mg/dL (ref 65–99)
Globulin, Total: 2.1 g/dL (ref 1.5–4.5)
Potassium: 4.4 mmol/L (ref 3.5–5.2)
Sodium: 143 mmol/L (ref 134–144)
TOTAL PROTEIN: 6.6 g/dL (ref 6.0–8.5)

## 2016-04-21 LAB — TSH: TSH: 4.14 u[IU]/mL (ref 0.450–4.500)

## 2016-06-08 ENCOUNTER — Ambulatory Visit (INDEPENDENT_AMBULATORY_CARE_PROVIDER_SITE_OTHER): Payer: Medicare HMO | Admitting: Family Medicine

## 2016-06-08 ENCOUNTER — Encounter: Payer: Self-pay | Admitting: Family Medicine

## 2016-06-08 VITALS — BP 120/85 | HR 61 | Temp 98.0°F | Resp 16 | Ht <= 58 in | Wt 163.0 lb

## 2016-06-08 DIAGNOSIS — R300 Dysuria: Secondary | ICD-10-CM

## 2016-06-08 DIAGNOSIS — N3000 Acute cystitis without hematuria: Secondary | ICD-10-CM | POA: Diagnosis not present

## 2016-06-08 DIAGNOSIS — R350 Frequency of micturition: Secondary | ICD-10-CM | POA: Diagnosis not present

## 2016-06-08 LAB — POCT URINALYSIS DIP (MANUAL ENTRY)
BILIRUBIN UA: NEGATIVE
BILIRUBIN UA: NEGATIVE
GLUCOSE UA: NEGATIVE
NITRITE UA: NEGATIVE
Protein Ur, POC: NEGATIVE
RBC UA: NEGATIVE
SPEC GRAV UA: 1.025
Urobilinogen, UA: 0.2
pH, UA: 6

## 2016-06-08 MED ORDER — CEPHALEXIN 500 MG PO CAPS: 500.0000 mg | ORAL_CAPSULE | Freq: Two times a day (BID) | ORAL | 0 refills | Status: DC

## 2016-06-08 NOTE — Progress Notes (Addendum)
By signing my name below, I, [name], attest that this documentation has been prepared under the direction and in the presence of [name], MD.  Electronically Signed: [name], Medical Scribe. 06/08/16. 1:52 PM.  Subjective:    Patient ID: Emily Steele, female    DOB: 13-Aug-1955, 61 y.o.   MRN: [id number]  HPI Chief Complaint  Patient presents with  . burning with urination    x 1 wk  . Dysuria    x 1 wk    HPI Comments: Emily Steele is a 61 y.o. female who presents to the [facility] complaining of dysuria onset a week.  Reports associated sxs of urinary frequency, urinary urgency. Pt reports a long hx of UTI, but notes this is her 3rd one since May 2017 - her last one in Dec 2017 and was rx keflex. Took baths with essential oils, drank a bottle of cranberry juice, and ate dried cranberries without relief of her sxs. Denies hematuria, back pain, and abdominal pain.  Urinalysis 03/1016 was negative for nitrite and LE.  No culture noted at that time or previous UTI in April 2017.   Onychomycosis: She was prescribed Lamisil on Jan10th. Initial LFTs was nl. She has not yet started Lamisil.  Patient Active Problem List   Diagnosis Date Noted  . Schizoaffective disorder, bipolar type ([place]) 11/14/2015  . Bipolar affective disorder, current episode hypomanic ([place]) 11/14/2015  . Degenerative disc disease, lumbar 10/20/2015  . History of encephalopathy 10/20/2015  . Family history of breast cancer in first degree relative 04/13/2015  . Anemia, iron deficiency 04/13/2015  . Vitamin D deficiency 04/13/2015  . Rosacea, acne 03/16/2015  . Meningioma ([name]) 01/07/2015  . Cognitive impairment 01/07/2015  . Spondylolisthesis at L4-L5 level 08/18/2013  . Altered mental status 08/17/2012  . 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 CAP (community acquired pneumonia) 08/17/2012  . Degenerative disc disease 08/13/2011  . H/O vitamin D deficiency 08/13/2011    . Major Depressive Disorder 05/17/2011  . Fibromyalgia 05/17/2011  . Hypothyroidism 05/17/2011  . Sleep behavior disorder, REM 05/17/2011  . Osteopenia 05/17/2011  . Obesity 05/17/2011  . Plantar fasciitis 05/17/2011   Past Medical History:  Diagnosis Date  . Allergy   . Anemia   . Anxiety   . Arthritis   . Brain tumor ([place])    right temporal, not ca  . Constipation   . DDD (degenerative disc disease)   . Depression   . Eczema   . Fibromyalgia   . Fibromyalgia   . GERD (gastroesophageal reflux disease)   . Hearing loss    1 ear - mild  . Herpes simplex   . Hx of migraine headaches   . Hypertension    not on medications  . Hypothyroid   . Obesity   . Osteoarthritis   . Osteopenia   . Osteoporosis   . Plantar fasciitis   . Pneumonia 08/2012  . Rosacea   . Sleep behavior disorder, REM   . Toenail fungus   . UTI (urinary tract infection)    Past Surgical History:  Procedure Laterality Date  . ABDOMINAL HYSTERECTOMY    . HAND SURGERY Bilateral    ligimant transfere to thumbs  . LAMINECTOMY  08/18/2013   L 4 L5    DR CRAM  . MAXIMUM ACCESS (MAS)POSTERIOR LUMBAR INTERBODY FUSION (PLIF) 1 LEVEL N/A 08/18/2013   Procedure: FOR MAXIMUM ACCESS (MAS) POSTERIOR LUMBAR INTERBODY FUSION (PLIF) 1 LEVEL;  Surgeon: [name]  Levy Sjogren, MD;  Location: [facility];  Service: Neurosurgery;  Laterality: N/A;  FOR MAXIMUM ACCESS (MAS) POSTERIOR LUMBAR INTERBODY FUSION (PLIF) 1 LEVEL L4-5  . SPINE SURGERY     Lamincetomy and Disectomy L5- S1  . TONSILLECTOMY AND ADENOIDECTOMY     Allergies  Allergen Reactions  . Doxycycline Other (See Comments)    headaches  . Sulfa Antibiotics     Headaches  . Tape Itching  . Methocarbamol Nausea Only    headaches   Prior to Admission medications   Medication Sig Start Date End Date Taking? Authorizing Provider  DULoxetine (CYMBALTA) 30 MG capsule Take 1 capsule (30 mg total) by mouth 2 (two) times daily. Reported on 11/05/2015 Patient not taking:  Reported on 06/08/2016 04/20/16   [name], MD  terbinafine (LAMISIL) 250 MG tablet Take 1 tablet (250 mg total) by mouth daily. Patient not taking: Reported on 06/08/2016 04/20/16   [name], MD   Social History   Social History  . Marital status: Single    Spouse name: N/A  . Number of children: 0  . Years of education: college   Occupational History  .  Other    n/a   Social History Main Topics  . Smoking status: Never Smoker  . Smokeless tobacco: Never Used  . Alcohol use No  . Drug use: No  . Sexual activity: No   Other Topics Concern  . Not on file   Social History Narrative   Patient lives at home alone.   Caffeine Use: 3-4 cups weekly   Single. Education: [employer].  Exercises 4x's a week.   Review of Systems  Gastrointestinal: Negative for abdominal pain.  Genitourinary: Positive for dysuria, frequency and urgency. Negative for hematuria.  Musculoskeletal: Negative for back pain.   Objective:  Physical Exam  Constitutional: She appears well-developed and well-nourished. No distress.  HENT:  Head: Normocephalic and atraumatic.  Eyes: Conjunctivae are normal.  Neck: Neck supple.  Cardiovascular: Normal rate.   Pulmonary/Chest: Effort normal.  Abdominal: Soft. She exhibits no mass. There is no tenderness. There is no guarding and no CVA tenderness.  Minimal pressure over the bladder but no pain  Neurological: She is alert.  Skin: Skin is warm and dry.  Psychiatric: She has a normal mood and affect. Her behavior is normal.  Nursing note and vitals reviewed.   Vitals:   06/08/16 1339  BP: 120/85  Pulse: 61  Resp: 16  Temp: 98 F (36.7 C)  TempSrc: Oral  SpO2: 96%  Weight: 163 lb (73.9 kg)  Height: 4\' 10"  (1.473 m)   Body mass index is 34.07 kg/m.   Results for orders placed or performed in visit on 06/08/16  POCT urinalysis dipstick  Result Value Ref Range   Color, UA yellow yellow   Clarity, UA clear clear   Glucose, UA negative  negative   Bilirubin, UA negative negative   Ketones, POC UA negative negative   Spec Grav, UA 1.025    Blood, UA negative negative   pH, UA 6.0    Protein Ur, POC negative negative   Urobilinogen, UA 0.2    Nitrite, UA Negative Negative   Leukocytes, UA Trace (A) Negative    Assessment & Plan:   Emily Steele is a 61 y.o. female Acute cystitis without hematuria - Plan: Urine culture, Urine Microscopic  Dysuria - Plan: POCT urinalysis dipstick  Urinary frequency  Typical symptoms with trace LE on urinalysis.  - Check  micro-, check urine culture, start Keflex. Depending on urine culture results, consider change in meds if needed.  - Discuss potential for recurrent urinary tract infections and may need evaluation with urology, but 2 previous episodes without ultra proven UTI. If recurrence of symptoms after current UTI, and if recurrent UTI is confirmed with culture, then consider evaluation with urology.   -RTC precautions if any worsening symptoms with current illness  See last visit regarding various medications discussed in start of Lamisil for onychomycosis. She has not taken any other medicines this time. Advised to follow-up to discuss those medicines and further concerns from last visit if needed.  Meds ordered this encounter  Medications  . cephALEXin (KEFLEX) 500 MG capsule    Sig: Take 1 capsule (500 mg total) by mouth 2 (two) times daily.    Dispense:  20 capsule    Refill:  0   Patient Instructions     Urine tests today indicates possible infection. I will verify this with a urine culture, but for now start antibiotic Keflex twice per day. If this culture does indicate an infection, and you have another urinary infection within the next few months, would recommend evaluation with urologist at that time. If any nausea, vomiting, fevers or any worsening symptoms return here or emergency room.  Once you start Lamisil, return within 6 weeks for repeat liver  testing.  Follow-up to discuss other medications or concerns from last visit if needed. Let me know if you have questions in the meantime.   IF you received an x-ray today, you will receive an invoice from [facility]. Please contact [facility] at [phone] with questions or concerns regarding your invoice.   IF you received labwork today, you will receive an invoice from [place]. Please contact [facility] at [phone] with questions or concerns regarding your invoice.   Our billing staff will not be able to assist you with questions regarding bills from these companies.  You will be contacted with the lab results as soon as they are available. The fastest way to get your results is to activate your My Chart account. Instructions are located on the last page of this paperwork. If you have not heard from [place] regarding the results in 2 weeks, please contact this office.       I personally performed the services described in this documentation, which was scribed in my presence. The recorded information has been reviewed and considered for accuracy and completeness, addended by me as needed, and agree with information above.  Signed,   [name], MD [facility].  06/08/16 2:05 PM

## 2016-06-08 NOTE — Patient Instructions (Addendum)
   Urine tests today indicates possible infection. I will verify this with a urine culture, but for now start antibiotic Keflex twice per day. If this culture does indicate an infection, and you have another urinary infection within the next few months, would recommend evaluation with urologist at that time. If any nausea, vomiting, fevers or any worsening symptoms return here or emergency room.  Once you start Lamisil, return within 6 weeks for repeat liver testing.  Follow-up to discuss other medications or concerns from last visit if needed. Let me know if you have questions in the meantime.   IF you received an x-ray today, you will receive an invoice from [facility]. Please contact [facility] at [phone] with questions or concerns regarding your invoice.   IF you received labwork today, you will receive an invoice from [place]. Please contact [facility] at [phone] with questions or concerns regarding your invoice.   Our billing staff will not be able to assist you with questions regarding bills from these companies.  You will be contacted with the lab results as soon as they are available. The fastest way to get your results is to activate your My Chart account. Instructions are located on the last page of this paperwork. If you have not heard from [place] regarding the results in 2 weeks, please contact this office.

## 2016-06-13 LAB — URINALYSIS, MICROSCOPIC ONLY
Bacteria, UA: NONE SEEN
Casts: NONE SEEN /lpf

## 2016-06-15 LAB — URINE CULTURE

## 2016-08-10 DIAGNOSIS — M5137 Other intervertebral disc degeneration, lumbosacral region: Secondary | ICD-10-CM | POA: Diagnosis not present

## 2016-08-10 DIAGNOSIS — M4316 Spondylolisthesis, lumbar region: Secondary | ICD-10-CM | POA: Diagnosis not present

## 2016-08-23 ENCOUNTER — Other Ambulatory Visit: Payer: Self-pay | Admitting: Physician Assistant

## 2016-08-23 DIAGNOSIS — E039 Hypothyroidism, unspecified: Secondary | ICD-10-CM

## 2016-09-04 ENCOUNTER — Telehealth: Payer: Self-pay | Admitting: Family Medicine

## 2016-09-04 DIAGNOSIS — B351 Tinea unguium: Secondary | ICD-10-CM

## 2016-09-04 DIAGNOSIS — Z5181 Encounter for therapeutic drug level monitoring: Secondary | ICD-10-CM

## 2016-09-04 NOTE — Telephone Encounter (Signed)
This may be in relation to using Lamisil for onychomycosis - discussed this at visit earlier this year.  If she has started Lamisil, and has been on that for 6 weeks, can return for lab only visit for liver function testing. order placed.

## 2016-09-04 NOTE — Telephone Encounter (Signed)
PATIENT STATES DR. Carlota Raspberry WANTED HER TO COME IN TO HAVE HER BLOOD WORK RECHECKED FOR HER LIVER. DOES SHE NEED AN OFFICE VISIT OR CAN HE JUST PUT IN A LAB ORDER ONLY FOR HER TO HAVE IT DONE WHEN SHE GETS BACK FROM HER 2 WEEK VACATION? SHE WOULD ALSO LIKE TO HAVE HER PHARMACY CHANGED IN HER CHART FROM [facility] ON [address] TO [facility]. BEST PHONE [phone] (CELL) [place]

## 2016-09-04 NOTE — Telephone Encounter (Signed)
05/2016 last ov, no lab orders

## 2016-09-07 ENCOUNTER — Other Ambulatory Visit: Payer: Self-pay | Admitting: Neurosurgery

## 2016-09-07 DIAGNOSIS — M4316 Spondylolisthesis, lumbar region: Secondary | ICD-10-CM

## 2016-09-08 ENCOUNTER — Telehealth: Payer: Self-pay | Admitting: Family Medicine

## 2016-09-08 NOTE — Telephone Encounter (Signed)
Emily Steele called from [facility] stated that pt told her to call her doctor to prescribe meds to her asked emily if they were refills she said no she would need new prescriptions.Rockey Situ emily that I would call the pt.. Pt didn't answer left a message to call back about meds..  Please advise: [phone] Pt: [phone]

## 2016-09-09 ENCOUNTER — Telehealth: Payer: Self-pay | Admitting: Family Medicine

## 2016-09-09 MED ORDER — CEPHALEXIN 500 MG PO CAPS: 500.0000 mg | ORAL_CAPSULE | Freq: Two times a day (BID) | ORAL | 0 refills | Status: DC

## 2016-09-09 NOTE — Telephone Encounter (Signed)
Answering service call.   See prior notes. Hx of recurrentl UTI, but not cultured on many of those occasions. Planned on possible urology eval if return of culture proven UTI.   Started past few days with return of UTI sx's - burning, frequency. Now with some urinary leakage. Same sx's as last UTI. Denies back pain, fever, abd pain, nausea or vomiting. deneis stool incontinence or saddle anesthesia. Currently out of town in [place], and called insurance carrier - no covered facilities in 50 miles. Advised to call me.  Discussed typical need for U/a and culture, but given her circumstance of out of town (through June 6th) I agreed to make exception this time  nd called in Keflex 500MG  BID for 10 days.  Discussed limitations in diagnosis as unable to check urinalysis or culture. If not improving in next few days or any worsening - agreed to be seen in local ER or urgent care. Follow up as planned in few weeks and can discuss urology eval at that time.

## 2016-09-23 ENCOUNTER — Encounter: Payer: Self-pay | Admitting: Family Medicine

## 2016-09-23 ENCOUNTER — Ambulatory Visit (INDEPENDENT_AMBULATORY_CARE_PROVIDER_SITE_OTHER): Payer: Medicare HMO | Admitting: Family Medicine

## 2016-09-23 VITALS — BP 116/82 | HR 63 | Temp 97.0°F | Resp 18 | Ht 58.27 in | Wt 172.0 lb

## 2016-09-23 DIAGNOSIS — N39498 Other specified urinary incontinence: Secondary | ICD-10-CM | POA: Diagnosis not present

## 2016-09-23 DIAGNOSIS — N390 Urinary tract infection, site not specified: Secondary | ICD-10-CM

## 2016-09-23 DIAGNOSIS — R3915 Urgency of urination: Secondary | ICD-10-CM

## 2016-09-23 DIAGNOSIS — E039 Hypothyroidism, unspecified: Secondary | ICD-10-CM

## 2016-09-23 DIAGNOSIS — B351 Tinea unguium: Secondary | ICD-10-CM

## 2016-09-23 DIAGNOSIS — Z5181 Encounter for therapeutic drug level monitoring: Secondary | ICD-10-CM

## 2016-09-23 DIAGNOSIS — R350 Frequency of micturition: Secondary | ICD-10-CM

## 2016-09-23 LAB — POCT URINALYSIS DIP (MANUAL ENTRY)
BILIRUBIN UA: NEGATIVE mg/dL
Bilirubin, UA: NEGATIVE
Blood, UA: NEGATIVE
Glucose, UA: NEGATIVE mg/dL
Leukocytes, UA: NEGATIVE
Nitrite, UA: NEGATIVE
PROTEIN UA: NEGATIVE mg/dL
SPEC GRAV UA: 1.025 (ref 1.010–1.025)
Urobilinogen, UA: 0.2 E.U./dL
pH, UA: 5.5 (ref 5.0–8.0)

## 2016-09-23 LAB — POC MICROSCOPIC URINALYSIS (UMFC): Mucus: ABSENT

## 2016-09-23 MED ORDER — TERBINAFINE HCL 250 MG PO TABS: 250.0000 mg | ORAL_TABLET | Freq: Every day | ORAL | 1 refills | Status: DC

## 2016-09-23 MED ORDER — LEVOFLOXACIN 500 MG PO TABS: 500.0000 mg | ORAL_TABLET | Freq: Every day | ORAL | 0 refills | Status: DC

## 2016-09-23 NOTE — Patient Instructions (Addendum)
I will refer you to urologist as discussed. Start antibiotic for possible repeat urine infection. We will check urine culture to make sure it does truly looks like a urinary tract infection. If you have any worsening symptoms, return here or other medical care facility.  I will refill your Lamisil for the foot fungus. Return in the next few months if that is not improving to discuss repeat testing if needed.  If you have new tendon pain or swelling, return for recheck right away.  I will repeat your thyroid test today, and depending on that level can restart a medication. Please follow-up within 2 weeks to discuss thyroid further as well as depression symptoms and plan. If you are having worsening depression, suicidal thoughts, or other worsening symptoms, return here or emergency room right away or call 911.   IF you received an x-ray today, you will receive an invoice from [facility]. Please contact [facility] at [phone] with questions or concerns regarding your invoice.   IF you received labwork today, you will receive an invoice from [place]. Please contact [facility] at [phone] with questions or concerns regarding your invoice.   Our billing staff will not be able to assist you with questions regarding bills from these companies.  You will be contacted with the lab results as soon as they are available. The fastest way to get your results is to activate your My Chart account. Instructions are located on the last page of this paperwork. If you have not heard from [place] regarding the results in 2 weeks, please contact this office.

## 2016-09-23 NOTE — Progress Notes (Signed)
By signing my name below, I, [name], attest that this documentation has been prepared under the direction and in the presence of [name], MD.  Electronically Signed: [name], Medical Scribe. 09/23/16. 12:50 PM.  Subjective:    Patient ID: Emily Steele, female    DOB: 06/19/1955, 62 y.o.   MRN: [id number]  HPI Chief Complaint  Patient presents with  . Urinary Frequency    with some burning x 3 days     HPI Comments: Emily Steele is a 61 y.o. female who presents to Primary Care at [facility] complaining of dysuria for 3 days now. She has a history of recurrent UTIs or symptoms of infections. She last saw me Feb 22nd, at that time reported 3 UTIs since May 2017, previous one was Dec 2017. Chart reviewed with [facility]A  Dec 2017 that was negative for nititrite and LE, unfortunately no culture from Dec or previous UTI from April 2017. She did have E. Coli, 25-50k colonies on urine culture March 2nd. See telephone note May 26th, she did have a few days of typical UTI sxs. She was out of town in [place], there was no facility withtin network on [address]. I did agree to start keflex 500 mg BID for 10 days. She is here for follow-up. Pt started keflex the day it was rx'ed and finished rx of keflex 4 days ago. She was feeling better in a day of taking abx, incontinence stopped within a day of taking abx, and her urgency resolved in +2 days of taking keflex. Pt's had incontinence for the first time, describing it as she didn't even know she had to go. Reports returning sxs of urinary urgency with incontinence due to not making it to the bathroom in time. Reports "totaling" voiding when she urinates. Denies fevers, chills, N/V, and back pain.  Onychomycosis: She was started on lamisil 250 mg QD, rx Jan 4th. Pt has been compliant for the past month, but ran out 3 day ago. Pt can't see a difference with her toenail appearence.  Left Ankle Pain: Due to walking a lot during her vacation -  was walking on the beach. Pt has been off her feet and her sxs have resolved.  Hypothyroid: Reports her weight has gone up and her depression is back. There was some confusion regarding history on her medication. She reports that she had been off of her medications for the past 3 weeks now. She had been taken every day since a previous visit, but then stated she had been taking medicine about half the time. It sounds that she has been compliant with her Synthroid prior to to 3 weeks ago. She does note some return of depression symptoms, but plans to return in next 2 weeks to discuss this further. She is not taking Cymbalta. Denies suicidal ideation or homicidal ideation.  Lab Results  Component Value Date   TSH 4.140 04/20/2016     Patient Active Problem List   Diagnosis Date Noted  . Schizoaffective disorder, bipolar type ([place]) 11/14/2015  . Bipolar affective disorder, current episode hypomanic ([place]) 11/14/2015  . Degenerative disc disease, lumbar 10/20/2015  . History of encephalopathy 10/20/2015  . Family history of breast cancer in first degree relative 04/13/2015  . Anemia, iron deficiency 04/13/2015  . Vitamin D deficiency 04/13/2015  . Rosacea, acne 03/16/2015  . Meningioma ([place]) 01/07/2015  . Cognitive impairment 01/07/2015  . Spondylolisthesis at L4-L5 level 08/18/2013  . Altered mental status 08/17/2012  .  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 CAP (community acquired pneumonia) 08/17/2012  . Degenerative disc disease 08/13/2011  . H/O vitamin D deficiency 08/13/2011  . Major Depressive Disorder 05/17/2011  . Fibromyalgia 05/17/2011  . Hypothyroidism 05/17/2011  . Sleep behavior disorder, REM 05/17/2011  . Osteopenia 05/17/2011  . Obesity 05/17/2011  . Plantar fasciitis 05/17/2011   Past Medical History:  Diagnosis Date  . Allergy   . Anemia   . Anxiety   . Arthritis   . Brain tumor ([address])    right temporal, not ca  . Constipation   . DDD (degenerative disc disease)    . Depression   . Eczema   . Fibromyalgia   . Fibromyalgia   . GERD (gastroesophageal reflux disease)   . Hearing loss    1 ear - mild  . Herpes simplex   . Hx of migraine headaches   . Hypertension    not on medications  . Hypothyroid   . Obesity   . Osteoarthritis   . Osteopenia   . Osteoporosis   . Plantar fasciitis   . Pneumonia 08/2012  . Rosacea   . Sleep behavior disorder, REM   . Toenail fungus   . UTI (urinary tract infection)    Past Surgical History:  Procedure Laterality Date  . ABDOMINAL HYSTERECTOMY    . HAND SURGERY Bilateral    ligimant transfere to thumbs  . LAMINECTOMY  08/18/2013   L 4 L5    DR CRAM  . MAXIMUM ACCESS (MAS)POSTERIOR LUMBAR INTERBODY FUSION (PLIF) 1 LEVEL N/A 08/18/2013   Procedure: FOR MAXIMUM ACCESS (MAS) POSTERIOR LUMBAR INTERBODY FUSION (PLIF) 1 LEVEL;  Surgeon: Elaina Hoops, MD;  Location: [facility];  Service: Neurosurgery;  Laterality: N/A;  FOR MAXIMUM ACCESS (MAS) POSTERIOR LUMBAR INTERBODY FUSION (PLIF) 1 LEVEL L4-5  . SPINE SURGERY     Lamincetomy and Disectomy L5- S1  . TONSILLECTOMY AND ADENOIDECTOMY     Allergies  Allergen Reactions  . Doxycycline Other (See Comments)    headaches  . Sulfa Antibiotics     Headaches  . Tape Itching  . Methocarbamol Nausea Only    headaches   Prior to Admission medications   Medication Sig Start Date End Date Taking? Authorizing Provider  DULoxetine (CYMBALTA) 30 MG capsule Take 1 capsule (30 mg total) by mouth 2 (two) times daily. Reported on 11/05/2015 Patient not taking: Reported on 06/08/2016 04/20/16   [name], MD  terbinafine (LAMISIL) 250 MG tablet Take 1 tablet (250 mg total) by mouth daily. Patient not taking: Reported on 06/08/2016 04/20/16   [name], MD   Social History   Social History  . Marital status: Single    Spouse name: N/A  . Number of children: 0  . Years of education: college   Occupational History  .  Other    n/a   Social History Main  Topics  . Smoking status: Never Smoker  . Smokeless tobacco: Never Used  . Alcohol use No  . Drug use: No  . Sexual activity: No   Other Topics Concern  . Not on file   Social History Narrative   Patient lives at home alone.   Caffeine Use: 3-4 cups weekly   Single. Education: [employer].  Exercises 4x's a week.   Review of Systems  Constitutional: Negative for chills and fever.  Gastrointestinal: Negative for nausea and vomiting.  Genitourinary: Positive for urgency.  Musculoskeletal: Negative for back pain.    Objective:  Physical  Exam  Constitutional: She appears well-developed and well-nourished. No distress.  HENT:  Head: Normocephalic and atraumatic.  Eyes: Conjunctivae are normal.  Neck: Neck supple.  Cardiovascular: Normal rate.   Pulmonary/Chest: Effort normal.  Abdominal: There is tenderness (minimal) in the left upper quadrant. There is no rebound and no guarding.  Musculoskeletal:  Achilles non tender  Neurological: She is alert.  Skin: Skin is warm and dry.  Thickened nail on great 2nd toenail on right foot Slight thickened of very distal aspect of great toe nail and little toenail Remainder of the toenails appear nl  Psychiatric: She has a normal mood and affect. Her behavior is normal.  Nursing note and vitals reviewed.   Vitals:   09/23/16 1210  BP: 116/82  Pulse: 63  Resp: 18  Temp: 97 F (36.1 C)  TempSrc: Oral  SpO2: 98%  Weight: 172 lb (78 kg)  Height: 4' 10.27" (1.48 m)  Body mass index is 35.62 kg/m.   Results for orders placed or performed in visit on 09/23/16  POCT urinalysis dipstick  Result Value Ref Range   Color, UA yellow yellow   Clarity, UA clear clear   Glucose, UA negative negative mg/dL   Bilirubin, UA negative negative   Ketones, POC UA negative negative mg/dL   Spec Grav, UA 1.025 1.010 - 1.025   Blood, UA negative negative   pH, UA 5.5 5.0 - 8.0   Protein Ur, POC negative negative mg/dL   Urobilinogen, UA 0.2 0.2 or  1.0 E.U./dL   Nitrite, UA Negative Negative   Leukocytes, UA Negative Negative  POCT Microscopic Urinalysis ([facility])  Result Value Ref Range   WBC,UR,HPF,POC Moderate (A) None WBC/hpf   RBC,UR,HPF,POC None None RBC/hpf   Bacteria Few (A) None, Too numerous to count   Mucus Absent Absent   Epithelial Cells, UR Per Microscopy Few (A) None, Too numerous to count cells/hpf   Assessment & Plan:   Emily Steele is a 61 y.o. female Recurrent UTI - Plan: Ambulatory referral to Urology, Urine Culture, levofloxacin (LEVAQUIN) 500 MG tablet Urinary frequency - Plan: POCT urinalysis dipstick, POCT Microscopic Urinalysis ([facility]), Ambulatory referral to Urology Urinary urgency - Plan: Ambulatory referral to Urology Other urinary incontinence - Plan: Ambulatory referral to Urology  -Recurrent UTI symptoms with return of symptoms past 3 days off of Keflex.  -Check urine culture, start Levaquin as she just finished Keflex. Tendinopathy risks and precautions were discussed  - Refer to urology to discuss recurrent UTIs as well as recent urinary incontinence symptoms.  Medication monitoring encounter - Plan: Hepatic Function Panel Onychomycosis - Plan: Hepatic Function Panel, terbinafine (LAMISIL) 250 MG tablet  - Still small amount of onychomycosis and great toenail of the left, right.  - Check hepatic function panel, restart Lamisil, 2 months prescription given, then follow-up to determine if longer course needed.  Hypothyroidism, unspecified type - Plan: TSH  - As above, some possible discrepancy on dosing. Off meds past 3 weeks. Check baseline TSH today, then may need to restart low-dose daily with repeat TSH in 6 weeks.  - She will follow-up with me in 2 weeks to discuss depression symptoms further as had been nonadherent to Cymbalta in the past, and has been off of that recently. Denies SI/HI, ER/911 precautions discussed if worsening prior to our follow-up. Understanding expressed  Meds ordered this  encounter  Medications  . levofloxacin (LEVAQUIN) 500 MG tablet    Sig: Take 1 tablet (500 mg total) by mouth daily.  Dispense:  7 tablet    Refill:  0  . terbinafine (LAMISIL) 250 MG tablet    Sig: Take 1 tablet (250 mg total) by mouth daily.    Dispense:  30 tablet    Refill:  1   Patient Instructions   I will refer you to urologist as discussed. Start antibiotic for possible repeat urine infection. We will check urine culture to make sure it does truly looks like a urinary tract infection. If you have any worsening symptoms, return here or other medical care facility.  I will refill your Lamisil for the foot fungus. Return in the next few months if that is not improving to discuss repeat testing if needed.  If you have new tendon pain or swelling, return for recheck right away.  I will repeat your thyroid test today, and depending on that level can restart a medication. Please follow-up within 2 weeks to discuss thyroid further as well as depression symptoms and plan. If you are having worsening depression, suicidal thoughts, or other worsening symptoms, return here or emergency room right away or call 911.   IF you received an x-ray today, you will receive an invoice from [facility]. Please contact [facility] at [phone] with questions or concerns regarding your invoice.   IF you received labwork today, you will receive an invoice from [facility]. Please contact [facility] at [phone] with questions or concerns regarding your invoice.   Our billing staff will not be able to assist you with questions regarding bills from these companies.  You will be contacted with the lab results as soon as they are available. The fastest way to get your results is to activate your My Chart account. Instructions are located on the last page of this paperwork. If you have not heard from [place] regarding the results in 2 weeks, please contact this office.       I personally  performed the services described in this documentation, which was scribed in my presence. The recorded information has been reviewed and considered for accuracy and completeness, addended by me as needed, and agree with information above.  Signed,   [name], MD [facility] at [facility].  09/23/16 1:44 PM

## 2016-09-24 LAB — HEPATIC FUNCTION PANEL
ALT: 47 IU/L — AB (ref 0–32)
AST: 31 IU/L (ref 0–40)
Albumin: 4.4 g/dL (ref 3.5–5.5)
Alkaline Phosphatase: 111 IU/L (ref 39–117)
BILIRUBIN, DIRECT: 0.08 mg/dL (ref 0.00–0.40)
Bilirubin Total: 0.2 mg/dL (ref 0.0–1.2)
TOTAL PROTEIN: 6.4 g/dL (ref 6.0–8.5)

## 2016-09-24 LAB — TSH: TSH: 4.15 u[IU]/mL (ref 0.450–4.500)

## 2016-09-25 LAB — TSH

## 2016-09-26 LAB — URINE CULTURE

## 2016-10-02 ENCOUNTER — Ambulatory Visit
Admission: RE | Admit: 2016-10-02 | Discharge: 2016-10-02 | Disposition: A | Discharge status: Home / Self Care | Payer: Medicare HMO | Source: Ambulatory Visit | Attending: Neurosurgery | Admitting: Neurosurgery

## 2016-10-02 DIAGNOSIS — M4328 Fusion of spine, sacral and sacrococcygeal region: Secondary | ICD-10-CM | POA: Diagnosis not present

## 2016-10-02 DIAGNOSIS — M4316 Spondylolisthesis, lumbar region: Secondary | ICD-10-CM

## 2016-10-03 DIAGNOSIS — M25551 Pain in right hip: Secondary | ICD-10-CM | POA: Diagnosis not present

## 2016-10-12 ENCOUNTER — Ambulatory Visit (INDEPENDENT_AMBULATORY_CARE_PROVIDER_SITE_OTHER): Payer: Medicare HMO | Admitting: Family Medicine

## 2016-10-12 ENCOUNTER — Other Ambulatory Visit: Payer: Self-pay | Admitting: Family Medicine

## 2016-10-12 ENCOUNTER — Encounter: Payer: Self-pay | Admitting: Family Medicine

## 2016-10-12 VITALS — BP 115/83 | HR 69 | Temp 98.0°F | Resp 18 | Ht 58.66 in | Wt 169.0 lb

## 2016-10-12 DIAGNOSIS — R300 Dysuria: Secondary | ICD-10-CM | POA: Diagnosis not present

## 2016-10-12 DIAGNOSIS — E039 Hypothyroidism, unspecified: Secondary | ICD-10-CM | POA: Diagnosis not present

## 2016-10-12 DIAGNOSIS — F329 Major depressive disorder, single episode, unspecified: Secondary | ICD-10-CM | POA: Diagnosis not present

## 2016-10-12 DIAGNOSIS — N390 Urinary tract infection, site not specified: Secondary | ICD-10-CM

## 2016-10-12 DIAGNOSIS — F32A Depression, unspecified: Secondary | ICD-10-CM

## 2016-10-12 LAB — POCT URINALYSIS DIP (MANUAL ENTRY)
Bilirubin, UA: NEGATIVE
Glucose, UA: NEGATIVE mg/dL
Ketones, POC UA: NEGATIVE mg/dL
Nitrite, UA: NEGATIVE
PROTEIN UA: NEGATIVE mg/dL
RBC UA: NEGATIVE
SPEC GRAV UA: 1.01 (ref 1.010–1.025)
UROBILINOGEN UA: 0.2 U/dL
pH, UA: 5.5 (ref 5.0–8.0)

## 2016-10-12 MED ORDER — DULOXETINE HCL 30 MG PO CPEP: 30.0000 mg | ORAL_CAPSULE | Freq: Two times a day (BID) | ORAL | 1 refills | Status: DC

## 2016-10-12 NOTE — Progress Notes (Signed)
Subjective:  By signing my name below, I, Moises Blood, attest that this documentation has been prepared under the direction and in the presence of [name], MD. Electronically Signed: Moises Blood, [place]. 10/12/2016 , 3:41 PM .  Patient was seen in Room 25 .   Patient ID: Emily Steele, female    DOB: Dec 24, 1955, 61 y.o.   MRN: [id number] Chief Complaint  Patient presents with  . Recurrent UTI    Follow up  . Depression   HPI Emily Steele is a 61 y.o. female Here for follow up.   Recurrent UTI She was seen on June 9th for recurrent UTI after previously treated suspected UTI on May 26th with keflex for 10 days. Then, a few days of recurrence after completing the antibiotics, she was started on Levaquin due to proximity of antibiotics and recurrence. She was referred to urology to discuss recurrent UTI's as well as recent urinary incontinence symptoms. Her urine culture indicated e coli, but sensitive to Levaquin.   Patient states she had double doses of her Levaquin, as one was sent via mail order and another was sent to [facility]. She took the initial 10 doses of Levaquin, and had some relief with it; however, her symptoms returned. She took the 2nd pack of 10 doses of Levaquin. Her symptoms have almost resolved, except still having some dysuria. She hasn't received a call from urology yet, and plans to call them today to double check on her appointment. She refused urine culture done today because it costs too much. She denies, fever, nausea, vomiting or abdominal pain.    Hypothyroidism She's been off of her medications prior 3 weeks on June 9th visit. Her TSH was 4.15; held off restarting medications at that time.   Weight gain Wt Readings from Last 3 Encounters:  10/12/16 169 lb (76.7 kg)  09/23/16 172 lb (78 kg)  06/08/16 163 lb (73.9 kg)   She states she's loss about 52 lbs in the past few years, when she came off a lot of her medications due to loss of insurance.  She notes first medication she did restart was levothyroxine, as it was cheaper.   We've held off on restarting this medication, and she notes gaining about 4 lbs recently while off of it.    Depression Depression screen [facility] 2/9 10/12/2016 09/23/2016 06/08/2016 04/20/2016 10/20/2015  Decreased Interest 3 0 0 3 0  Down, Depressed, Hopeless 3 0 0 3 0  PHQ - 2 Score 6 0 0 6 0  Altered sleeping 2 - - 3 -  Tired, decreased energy 2 - - 3 -  Change in appetite 2 - - 3 -  Feeling bad or failure about yourself  2 - - 3 -  Trouble concentrating 0 - - 2 -  Moving slowly or fidgety/restless 0 - - 0 -  Suicidal thoughts 0 - - 0 -  PHQ-9 Score 14 - - 20 -   She had previously taken Cymbalta. Based on problem list, she had schizoaffective disorder, bipolar disorder and depression- all listed. She also has history of cognitive impairment from encephalopathy, fibromyalgia, and major depressive disorder. Per previous note in July 2017, she had been evaluated by Dr. Toy Care from 2005 through 2016. She reported auditory hallucinations and losing time at July 2017 visit. She was recommended re-establish with psychiatry and close follow up with neurology. She has not followed up with Dr. Tamala Julian.   Patient states she didn't want to restart Cymbalta 60mg   due to cost prior. She has new insurance this year and wants to restart on Cymbalta possibly. She was previously on Cymbalta and Wellbutrin, but started having trouble staying awake during the day. Then, her psychiatrist at the time, prescribed her a sedative to help with sleep. After she lost her job, she had troubles with anxiety and was given Lorazepam. She had other problems in life, including fractured spine and 3 deaths in her family. She's still having trouble sleeping at night, about 5 hours of sleep a night, occurring 4 times a week. She had a 24-hour EEG done in the past, which was negative. Her rheumatologist recommends her to restart Cymbalta. She denies  hallucinations or going on spending sprees.   Patient Active Problem List   Diagnosis Date Noted  . Schizoaffective disorder, bipolar type ([place]) 11/14/2015  . Bipolar affective disorder, current episode hypomanic ([place]) 11/14/2015  . Degenerative disc disease, lumbar 10/20/2015  . History of encephalopathy 10/20/2015  . Family history of breast cancer in first degree relative 04/13/2015  . Anemia, iron deficiency 04/13/2015  . Vitamin D deficiency 04/13/2015  . Rosacea, acne 03/16/2015  . Meningioma ([place]) 01/07/2015  . Cognitive impairment 01/07/2015  . Spondylolisthesis at L4-L5 level 08/18/2013  . Altered mental status 08/17/2012  . 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 CAP (community acquired pneumonia) 08/17/2012  . Degenerative disc disease 08/13/2011  . H/O vitamin D deficiency 08/13/2011  . Major Depressive Disorder 05/17/2011  . Fibromyalgia 05/17/2011  . Hypothyroidism 05/17/2011  . Sleep behavior disorder, REM 05/17/2011  . Osteopenia 05/17/2011  . Obesity 05/17/2011  . Plantar fasciitis 05/17/2011   Past Medical History:  Diagnosis Date  . Allergy   . Anemia   . Anxiety   . Arthritis   . Brain tumor ([place])    right temporal, not ca  . Constipation   . DDD (degenerative disc disease)   . Depression   . Eczema   . Fibromyalgia   . Fibromyalgia   . GERD (gastroesophageal reflux disease)   . Hearing loss    1 ear - mild  . Herpes simplex   . Hx of migraine headaches   . Hypertension    not on medications  . Hypothyroid   . Obesity   . Osteoarthritis   . Osteopenia   . Osteoporosis   . Plantar fasciitis   . Pneumonia 08/2012  . Rosacea   . Sleep behavior disorder, REM   . Toenail fungus   . UTI (urinary tract infection)    Past Surgical History:  Procedure Laterality Date  . ABDOMINAL HYSTERECTOMY    . HAND SURGERY Bilateral    ligimant transfere to thumbs  . LAMINECTOMY  08/18/2013   L 4 L5    DR CRAM  . MAXIMUM ACCESS (MAS)POSTERIOR LUMBAR  INTERBODY FUSION (PLIF) 1 LEVEL N/A 08/18/2013   Procedure: FOR MAXIMUM ACCESS (MAS) POSTERIOR LUMBAR INTERBODY FUSION (PLIF) 1 LEVEL;  Surgeon: Elaina Hoops, MD;  Location: [facility];  Service: Neurosurgery;  Laterality: N/A;  FOR MAXIMUM ACCESS (MAS) POSTERIOR LUMBAR INTERBODY FUSION (PLIF) 1 LEVEL L4-5  . SPINE SURGERY     Lamincetomy and Disectomy L5- S1  . TONSILLECTOMY AND ADENOIDECTOMY     Allergies  Allergen Reactions  . Doxycycline Other (See Comments)    headaches  . Sulfa Antibiotics     Headaches  . Tape Itching  . Methocarbamol Nausea Only    headaches   Prior to Admission medications   Medication Sig Start Date  End Date Taking? Authorizing Provider  DULoxetine (CYMBALTA) 30 MG capsule Take 1 capsule (30 mg total) by mouth 2 (two) times daily. Reported on 11/05/2015 Patient not taking: Reported on 06/08/2016 04/20/16   [name], MD  terbinafine (LAMISIL) 250 MG tablet Take 1 tablet (250 mg total) by mouth daily. 09/23/16   [name], MD   Social History   Social History  . Marital status: Single    Spouse name: N/A  . Number of children: 0  . Years of education: college   Occupational History  .  Other    n/a   Social History Main Topics  . Smoking status: Never Smoker  . Smokeless tobacco: Never Used  . Alcohol use No  . Drug use: No  . Sexual activity: No   Other Topics Concern  . Not on file   Social History Narrative   Patient lives at home alone.   Caffeine Use: 3-4 cups weekly   Single. Education: [employer].  Exercises 4x's a week.   Review of Systems  Constitutional: Negative for chills, fatigue, fever and unexpected weight change.  Respiratory: Negative for cough.   Gastrointestinal: Negative for abdominal pain, constipation, diarrhea, nausea and vomiting.  Genitourinary: Positive for urgency.  Skin: Negative for rash and wound.  Neurological: Negative for dizziness, weakness and headaches.  Psychiatric/Behavioral: Positive for  dysphoric mood. Negative for hallucinations. The patient is nervous/anxious.        Objective:   Physical Exam  Constitutional: She is oriented to person, place, and time. She appears well-developed and well-nourished. No distress.  HENT:  Head: Normocephalic and atraumatic.  Eyes: EOM are normal. Pupils are equal, round, and reactive to light.  Neck: Neck supple.  Cardiovascular: Normal rate.   Pulmonary/Chest: Effort normal. No respiratory distress.  Abdominal: Soft. Bowel sounds are normal. She exhibits no distension. There is no tenderness. There is no CVA tenderness.  Musculoskeletal: Normal range of motion.  Neurological: She is alert and oriented to person, place, and time.  Skin: Skin is warm and dry.  Psychiatric: She has a normal mood and affect. Her behavior is normal.  Nursing note and vitals reviewed.   Vitals:   10/12/16 1445  BP: 115/83  Pulse: 69  Resp: 18  Temp: 98 F (36.7 C)  TempSrc: Oral  SpO2: 98%  Weight: 169 lb (76.7 kg)  Height: 4' 10.66" (1.49 m)   Results for orders placed or performed in visit on 10/12/16  POCT urinalysis dipstick  Result Value Ref Range   Color, UA yellow yellow   Clarity, UA clear clear   Glucose, UA negative negative mg/dL   Bilirubin, UA negative negative   Ketones, POC UA negative negative mg/dL   Spec Grav, UA 1.010 1.010 - 1.025   Blood, UA negative negative   pH, UA 5.5 5.0 - 8.0   Protein Ur, POC negative negative mg/dL   Urobilinogen, UA 0.2 0.2 or 1.0 E.U./dL   Nitrite, UA Negative Negative   Leukocytes, UA Trace (A) Negative       Assessment & Plan:   Emily Steele is a 61 y.o. female Dysuria - Plan: POCT urinalysis dipstick, Urine Microscopic Recurrent urinary tract infection - Plan: Urine Microscopic  - receommended repeat urine cx, as recent treatment. Refused. Decided against treatment at present other that follow up with urology, but discussed possible need for abx if sx's continued - especially if  any worsening.   Hypothyroidism, unspecified type - Plan: TSH+T4F+T3Free  -  check labs to determine if change in dose needed as recent restart in meds.   Depression, unspecified depression type - Plan: DULoxetine (CYMBALTA) 30 MG capsule  - restarted cymbalta for depression and Bipolar, but if any manic or psychosis sx's - ER?RTC precautions. Planned to follow up with psychiatry to manage meds further.   Meds ordered this encounter  Medications  . DULoxetine (CYMBALTA) 30 MG capsule    Sig: Take 1 capsule (30 mg total) by mouth 2 (two) times daily. Start with 1 cap QD for initial week, then increase to BID.    Dispense:  60 capsule    Refill:  1   Patient Instructions    You were referred to [facility], here is their number. You can call to check status of that referral.  [phone] Due to your continue burning with urination, I will check another urine test, but a urine culture would be needed to determine best antibiotic if one was needed. If you have worsening burning, fever, abdominal pain, or incontinence, pleas return here or other medical provider for that test and treatment. Return to the clinic or go to the nearest emergency room if any of your symptoms worsen or new symptoms occur.  Weight change may be related to your thyroid, but TSH was normal off meds last visit.  I will check repeat thyroid testing today and let you know if medication is needed.  I am concerned about your depression and possible Bipolar symptoms with decreased sleep. I do not mind starting the Cymbalta to help with depression and fibromyalgia symptoms, but would like you to follow up with psychiatry to discuss medication further.  If you have any increased difficulty for sleep or other manic symptoms, return right away or call your psychiatrist. Return to the clinic or go to the nearest emergency room if any of your symptoms worsen or new symptoms occur.    IF you received an x-ray today, you will  receive an invoice from [facility]. Please contact [facility] at [phone] with questions or concerns regarding your invoice.   IF you received labwork today, you will receive an invoice from [facility]. Please contact [facility] at [phone] with questions or concerns regarding your invoice.   Our billing staff will not be able to assist you with questions regarding bills from these companies.  You will be contacted with the lab results as soon as they are available. The fastest way to get your results is to activate your My Chart account. Instructions are located on the last page of this paperwork. If you have not heard from [place] regarding the results in 2 weeks, please contact this office.       I personally performed the services described in this documentation, which was scribed in my presence. The recorded information has been reviewed and considered for accuracy and completeness, addended by me as needed, and agree with information above.  Signed,   [name], MD [facility].  10/14/16 7:58 PM

## 2016-10-12 NOTE — Patient Instructions (Addendum)
  You were referred to [facility], here is their number. You can call to check status of that referral.  [phone] Due to your continue burning with urination, I will check another urine test, but a urine culture would be needed to determine best antibiotic if one was needed. If you have worsening burning, fever, abdominal pain, or incontinence, pleas return here or other medical provider for that test and treatment. Return to the clinic or go to the nearest emergency room if any of your symptoms worsen or new symptoms occur.  Weight change may be related to your thyroid, but TSH was normal off meds last visit.  I will check repeat thyroid testing today and let you know if medication is needed.  I am concerned about your depression and possible Bipolar symptoms with decreased sleep. I do not mind starting the Cymbalta to help with depression and fibromyalgia symptoms, but would like you to follow up with psychiatry to discuss medication further.  If you have any increased difficulty for sleep or other manic symptoms, return right away or call your psychiatrist. Return to the clinic or go to the nearest emergency room if any of your symptoms worsen or new symptoms occur.    IF you received an x-ray today, you will receive an invoice from [facility]. Please contact [facility] at [phone] with questions or concerns regarding your invoice.   IF you received labwork today, you will receive an invoice from [facility]. Please contact [facility] at [phone] with questions or concerns regarding your invoice.   Our billing staff will not be able to assist you with questions regarding bills from these companies.  You will be contacted with the lab results as soon as they are available. The fastest way to get your results is to activate your My Chart account. Instructions are located on the last page of this paperwork. If you have not heard from [place] regarding the results in 2 weeks,  please contact this office.

## 2016-10-13 LAB — TSH+T4F+T3FREE
FREE T4: 1.26 ng/dL (ref 0.82–1.77)
T3, Free: 2.8 pg/mL (ref 2.0–4.4)
TSH: 4.09 u[IU]/mL (ref 0.450–4.500)

## 2016-10-16 ENCOUNTER — Encounter (INDEPENDENT_AMBULATORY_CARE_PROVIDER_SITE_OTHER): Payer: Self-pay | Admitting: Orthopedic Surgery

## 2016-10-16 ENCOUNTER — Ambulatory Visit (INDEPENDENT_AMBULATORY_CARE_PROVIDER_SITE_OTHER): Payer: Medicare HMO | Admitting: Orthopedic Surgery

## 2016-10-16 DIAGNOSIS — M25551 Pain in right hip: Secondary | ICD-10-CM

## 2016-10-16 NOTE — Progress Notes (Signed)
Office Visit Note   Patient: Emily Steele           Date of Birth: 1955/07/05           MRN: [id number] Visit Date: 10/16/2016 Requested by: Kary Kos, MD [address] [place], [place] [address] PCP: [name], MD  Subjective: Chief Complaint  Patient presents with  . Right Hip - Pain    HPI: Emily Steele is a 61 year old patient with right hip pain.  She reports right hip and buttock pain which she localizes to the initial tuberosity.  Been going on for 1 year.  Denies any history of injury.  At times she feels on both sides.  Denies any radicular pain but it will wake her from sleep.  She did have back surgery done by Dr. Saintclair Halsted in 2004 and 2015.  Has sacral MRI imaging had [facility] which shows some Tarlov cysts but otherwise unremarkable.  She denies any groin pain.  She states that the pain as level VIII out of 10 at times.  Denies any bowel or bladder symptoms              ROS: All systems reviewed are negative as they relate to the chief complaint within the history of present illness.  Patient denies  fevers or chills.   Assessment & Plan: Visit Diagnoses:  1. Pain in right hip     Plan: Impression is right initial tuberosity pain with some pain with resisted hamstring strength testing on the right but not on the left.  She also has some trochanteric tenderness on the right-hand side less so on the left.  Pain has been going on for over a year.  She's had back surgery but that seems to be asymptomatic at this time.  Plan MRI right hip to evaluate the hamstring attachment site on the initial tuberosity as well as the trochanteric bursal region.  I'll see her back after that study  Follow-Up Instructions: Return for after MRI.   Orders:  Orders Placed This Encounter  Procedures  . MR Hip Right w/o contrast   No orders of the defined types were placed in this encounter.     Procedures: No procedures performed   Clinical Data: No  additional findings.  Objective: Vital Signs: There were no vitals taken for this visit.  Physical Exam:   Constitutional: Patient appears well-developed HEENT:  Head: Normocephalic Eyes:EOM are normal Neck: Normal range of motion Cardiovascular: Normal rate Pulmonary/chest: Effort normal Neurologic: Patient is alert Skin: Skin is warm Psychiatric: Patient has normal mood and affect    Ortho Exam: Orthopedic exam demonstrates pre-normal gait and alignment no nerve retention signs no groin pain with internal/external rotation of either leg.  She has good ankle dorsiflexion plantar flexion quite hamstring strength but does have some pain localizing to the initial tuberosity with resisted hamstring testing on the right compared to the left.  Hip flexion strength is symmetric and intact.  She also has trochanteric tenderness on the right compared to the left.  Not much in the way of discrete sacroiliac joint tenderness to direct palpation.  Specialty Comments:  No specialty comments available.  Imaging: No results found.   PMFS History: Patient Active Problem List   Diagnosis Date Noted  . Schizoaffective disorder, bipolar type ([place]) 11/14/2015  . Bipolar affective disorder, current episode hypomanic ([place]) 11/14/2015  . Degenerative disc disease, lumbar 10/20/2015  . History of encephalopathy 10/20/2015  . Family history of  breast cancer in first degree relative 04/13/2015  . Anemia, iron deficiency 04/13/2015  . Vitamin D deficiency 04/13/2015  . Rosacea, acne 03/16/2015  . Meningioma ([place]) 01/07/2015  . Cognitive impairment 01/07/2015  . Spondylolisthesis at L4-L5 level 08/18/2013  . Altered mental status 08/17/2012  . 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 CAP (community acquired pneumonia) 08/17/2012  . Degenerative disc disease 08/13/2011  . H/O vitamin D deficiency 08/13/2011  . Major Depressive Disorder 05/17/2011  . Fibromyalgia 05/17/2011  . Hypothyroidism  05/17/2011  . Sleep behavior disorder, REM 05/17/2011  . Osteopenia 05/17/2011  . Obesity 05/17/2011  . Plantar fasciitis 05/17/2011   Past Medical History:  Diagnosis Date  . Allergy   . Anemia   . Anxiety   . Arthritis   . Brain tumor ([place])    right temporal, not ca  . Constipation   . DDD (degenerative disc disease)   . Depression   . Eczema   . Fibromyalgia   . Fibromyalgia   . GERD (gastroesophageal reflux disease)   . Hearing loss    1 ear - mild  . Herpes simplex   . Hx of migraine headaches   . Hypertension    not on medications  . Hypothyroid   . Obesity   . Osteoarthritis   . Osteopenia   . Osteoporosis   . Plantar fasciitis   . Pneumonia 08/2012  . Rosacea   . Sleep behavior disorder, REM   . Toenail fungus   . UTI (urinary tract infection)     Family History  Problem Relation Age of Onset  . Cancer Mother 72       lung cancer  . Hypertension Mother   . Cancer Sister        Breast cancer  . Heart disease Sister   . Asthma Sister   . Asthma Brother   . Drug abuse Brother   . Hypertension Brother     Past Surgical History:  Procedure Laterality Date  . ABDOMINAL HYSTERECTOMY    . HAND SURGERY Bilateral    ligimant transfere to thumbs  . LAMINECTOMY  08/18/2013   L 4 L5    DR CRAM  . MAXIMUM ACCESS (MAS)POSTERIOR LUMBAR INTERBODY FUSION (PLIF) 1 LEVEL N/A 08/18/2013   Procedure: FOR MAXIMUM ACCESS (MAS) POSTERIOR LUMBAR INTERBODY FUSION (PLIF) 1 LEVEL;  Surgeon: Elaina Hoops, MD;  Location: [facility];  Service: Neurosurgery;  Laterality: N/A;  FOR MAXIMUM ACCESS (MAS) POSTERIOR LUMBAR INTERBODY FUSION (PLIF) 1 LEVEL L4-5  . SPINE SURGERY     Lamincetomy and Disectomy L5- S1  . TONSILLECTOMY AND ADENOIDECTOMY     Social History   Occupational History  .  Other    n/a   Social History Main Topics  . Smoking status: Never Smoker  . Smokeless tobacco: Never Used  . Alcohol use No  . Drug use: No  . Sexual activity: No

## 2016-10-25 DIAGNOSIS — M25571 Pain in right ankle and joints of right foot: Secondary | ICD-10-CM | POA: Diagnosis not present

## 2016-10-25 DIAGNOSIS — M545 Low back pain: Secondary | ICD-10-CM | POA: Diagnosis not present

## 2016-10-31 ENCOUNTER — Ambulatory Visit: Payer: Medicare HMO | Admitting: Family Medicine

## 2016-11-01 ENCOUNTER — Encounter: Payer: Self-pay | Admitting: Family Medicine

## 2016-11-01 ENCOUNTER — Ambulatory Visit (INDEPENDENT_AMBULATORY_CARE_PROVIDER_SITE_OTHER): Payer: Medicare HMO | Admitting: Family Medicine

## 2016-11-01 VITALS — BP 115/82 | HR 75 | Temp 98.0°F | Resp 18 | Ht 59.0 in | Wt 171.0 lb

## 2016-11-01 DIAGNOSIS — F329 Major depressive disorder, single episode, unspecified: Secondary | ICD-10-CM | POA: Diagnosis not present

## 2016-11-01 DIAGNOSIS — E034 Atrophy of thyroid (acquired): Secondary | ICD-10-CM

## 2016-11-01 DIAGNOSIS — B351 Tinea unguium: Secondary | ICD-10-CM

## 2016-11-01 DIAGNOSIS — M797 Fibromyalgia: Secondary | ICD-10-CM

## 2016-11-01 DIAGNOSIS — F32A Depression, unspecified: Secondary | ICD-10-CM

## 2016-11-01 DIAGNOSIS — Z23 Encounter for immunization: Secondary | ICD-10-CM

## 2016-11-01 MED ORDER — ZOSTER VAC RECOMB ADJUVANTED 50 MCG/0.5ML IM SUSR: 0.5000 mL | Freq: Once | INTRAMUSCULAR | 1 refills | Status: DC

## 2016-11-01 MED ORDER — DULOXETINE HCL 30 MG PO CPEP: 30.0000 mg | ORAL_CAPSULE | Freq: Every day | ORAL | 1 refills | Status: DC

## 2016-11-01 MED ORDER — TERBINAFINE HCL 250 MG PO TABS: 250.0000 mg | ORAL_TABLET | Freq: Every day | ORAL | 1 refills | Status: DC

## 2016-11-01 MED ORDER — LEVOTHYROXINE SODIUM 50 MCG PO TABS: 50.0000 ug | ORAL_TABLET | Freq: Every day | ORAL | 1 refills | Status: DC

## 2016-11-01 MED ORDER — ZOSTER VAC RECOMB ADJUVANTED 50 MCG/0.5ML IM SUSR: 0.5000 mL | Freq: Once | INTRAMUSCULAR | 1 refills | Status: AC

## 2016-11-01 NOTE — Patient Instructions (Signed)
     IF you received an x-ray today, you will receive an invoice from [facility]. Please contact [facility] at [phone] with questions or concerns regarding your invoice.   IF you received labwork today, you will receive an invoice from [facility]. Please contact [facility] at [phone] with questions or concerns regarding your invoice.   Our billing staff will not be able to assist you with questions regarding bills from these companies.  You will be contacted with the lab results as soon as they are available. The fastest way to get your results is to activate your My Chart account. Instructions are located on the last page of this paperwork. If you have not heard from us regarding the results in 2 weeks, please contact this office.     

## 2016-11-01 NOTE — Progress Notes (Signed)
Subjective:    Patient ID: Emily Steele, female    DOB: 06-11-1955, 61 y.o.   MRN: [id number]  11/01/2016  Medication Management (would like to talk about the shingles vaccine ) and Medication Refill (just need printed Rx because she is going out of town for a while because of  a family Veterinary surgeon. )   HPI This 61 y.o. female presents for hypothyroidism and shingles vaccine. Leaving town because brother on Hospice; brother currently suffering with shingles.  Pt wanting shingles vaccine.    Ran out of thyroid medication since April 2018.  Has gained ten pounds since 04/2016.  Needs refill.   Thickened toenails for years.  History of fungus.    BP Readings from Last 3 Encounters:  11/01/16 115/82  10/12/16 115/83  09/23/16 116/82   Wt Readings from Last 3 Encounters:  11/01/16 171 lb (77.6 kg)  10/12/16 169 lb (76.7 kg)  09/23/16 172 lb (78 kg)   Immunization History  Administered Date(s) Administered  . Influenza Split 02/29/2012  . Influenza,inj,Quad PF,36+ Mos 02/07/2013, 04/08/2014, 03/12/2015  . Influenza-Unspecified 03/17/2016  . Td 04/17/2010    Review of Systems  Constitutional: Positive for fatigue. Negative for chills, diaphoresis and fever.  Eyes: Negative for visual disturbance.  Respiratory: Negative for cough and shortness of breath.   Cardiovascular: Negative for chest pain, palpitations and leg swelling.  Gastrointestinal: Negative for abdominal pain, constipation, diarrhea, nausea and vomiting.  Endocrine: Negative for cold intolerance, heat intolerance, polydipsia, polyphagia and polyuria.  Neurological: Negative for dizziness, tremors, seizures, syncope, facial asymmetry, speech difficulty, weakness, light-headedness, numbness and headaches.  Psychiatric/Behavioral: Positive for dysphoric mood. Negative for self-injury, sleep disturbance and suicidal ideas. The patient is nervous/anxious.     Past Medical History:  Diagnosis Date  . Allergy   . Anemia    . Anxiety   . Arthritis   . Brain tumor ([place])    right temporal, not ca  . Constipation   . DDD (degenerative disc disease)   . Depression   . Eczema   . Fibromyalgia   . Fibromyalgia   . GERD (gastroesophageal reflux disease)   . Hearing loss    1 ear - mild  . Herpes simplex   . Hx of migraine headaches   . Hypertension    not on medications  . Hypothyroid   . Obesity   . Osteoarthritis   . Osteopenia   . Osteoporosis   . Plantar fasciitis   . Pneumonia 08/2012  . Rosacea   . Sleep behavior disorder, REM   . Toenail fungus   . UTI (urinary tract infection)    Past Surgical History:  Procedure Laterality Date  . ABDOMINAL HYSTERECTOMY    . HAND SURGERY Bilateral    ligimant transfere to thumbs  . LAMINECTOMY  08/18/2013   L 4 L5    DR CRAM  . MAXIMUM ACCESS (MAS)POSTERIOR LUMBAR INTERBODY FUSION (PLIF) 1 LEVEL N/A 08/18/2013   Procedure: FOR MAXIMUM ACCESS (MAS) POSTERIOR LUMBAR INTERBODY FUSION (PLIF) 1 LEVEL;  Surgeon: Elaina Hoops, MD;  Location: [facility];  Service: Neurosurgery;  Laterality: N/A;  FOR MAXIMUM ACCESS (MAS) POSTERIOR LUMBAR INTERBODY FUSION (PLIF) 1 LEVEL L4-5  . SPINE SURGERY     Lamincetomy and Disectomy L5- S1  . TONSILLECTOMY AND ADENOIDECTOMY     Allergies  Allergen Reactions  . Doxycycline Other (See Comments)    headaches  . Sulfa Antibiotics     Headaches  . Tape Itching  .  Methocarbamol Nausea Only    headaches    Social History   Social History  . Marital status: Single    Spouse name: N/A  . Number of children: 0  . Years of education: college   Occupational History  .  Other    n/a   Social History Main Topics  . Smoking status: Never Smoker  . Smokeless tobacco: Never Used  . Alcohol use No  . Drug use: No  . Sexual activity: No   Other Topics Concern  . Not on file   Social History Narrative   Patient lives at home alone.   Caffeine Use: 3-4 cups weekly   Single. Education: [employer].  Exercises 4x's a  week.   Family History  Problem Relation Age of Onset  . Cancer Mother 4       lung cancer  . Hypertension Mother   . Cancer Sister        Breast cancer  . Heart disease Sister   . Asthma Sister   . Asthma Brother   . Drug abuse Brother   . Hypertension Brother        Objective:    BP 115/82   Pulse 75   Temp 98 F (36.7 C) (Oral)   Resp 18   Ht 4\' 11"  (1.499 m)   Wt 171 lb (77.6 kg)   SpO2 97%   BMI 34.54 kg/m  Physical Exam  Constitutional: She is oriented to person, place, and time. She appears well-developed and well-nourished. No distress.  HENT:  Head: Normocephalic and atraumatic.  Right Ear: External ear normal.  Left Ear: External ear normal.  Nose: Nose normal.  Mouth/Throat: Oropharynx is clear and moist.  Eyes: Pupils are equal, round, and reactive to light. Conjunctivae and EOM are normal.  Neck: Normal range of motion. Neck supple. Carotid bruit is not present. No thyromegaly present.  Cardiovascular: Normal rate, regular rhythm, normal heart sounds and intact distal pulses.  Exam reveals no gallop and no friction rub.   No murmur heard. Pulmonary/Chest: Effort normal and breath sounds normal. She has no wheezes. She has no rales.  Abdominal: Soft. Bowel sounds are normal. She exhibits no distension and no mass. There is no tenderness. There is no rebound and no guarding.  Lymphadenopathy:    She has no cervical adenopathy.  Neurological: She is alert and oriented to person, place, and time. No cranial nerve deficit.  Skin: Skin is warm and dry. No rash noted. She is not diaphoretic. No erythema. No pallor.  Dystrophic nails B feet.  Psychiatric: She has a normal mood and affect. Her behavior is normal.   Results for orders placed or performed in visit on 10/12/16  TSH+T4F+T3Free  Result Value Ref Range   TSH 4.090 0.450 - 4.500 uIU/mL   T3, Free 2.8 2.0 - 4.4 pg/mL   Free T4 1.26 0.82 - 1.77 ng/dL       Assessment & Plan:   1. Depression,  unspecified depression type   2. Onychomycosis   3. Fibromyalgia   4. Hypothyroidism due to acquired atrophy of thyroid   5. Need for shingles vaccine    -new onset stress reaction due to declining health of brother who is on Hospice. Counseling provided.  Rx for Cymbalta 30mg  1-2 daily.   -rx for Lamisil 250mg  one daily provided.  Hepatic panel performed 09/23/16.  -rx for Synthroid provided.    No orders of the defined types were placed in this encounter.  Meds ordered this encounter  Medications  . DULoxetine (CYMBALTA) 30 MG capsule    Sig: Take 1-2 capsules (30-60 mg total) by mouth daily.    Dispense:  60 capsule    Refill:  1  . terbinafine (LAMISIL) 250 MG tablet    Sig: Take 1 tablet (250 mg total) by mouth daily.    Dispense:  30 tablet    Refill:  1  . DISCONTD: Zoster Vac Recomb Adjuvanted [facility]) injection    Sig: Inject 0.5 mLs into the muscle once.    Dispense:  0.5 mL    Refill:  1  . DISCONTD: levothyroxine (SYNTHROID, LEVOTHROID) 50 MCG tablet    Sig: Take 1 tablet (50 mcg total) by mouth daily before breakfast.    Dispense:  90 tablet    Refill:  1  . DISCONTD: levothyroxine (SYNTHROID, LEVOTHROID) 50 MCG tablet    Sig: Take 1 tablet (50 mcg total) by mouth daily before breakfast.    Dispense:  30 tablet    Refill:  1  . Zoster Vac Recomb Adjuvanted [facility]) injection    Sig: Inject 0.5 mLs into the muscle once.    Dispense:  0.5 mL    Refill:  1  . levothyroxine (SYNTHROID, LEVOTHROID) 50 MCG tablet    Sig: Take 1 tablet (50 mcg total) by mouth daily before breakfast.    Dispense:  90 tablet    Refill:  1    No Follow-up on file.   Lorrane Mccay Elayne Guerin, M.D. Primary Care at [facility] previously [facility] [place] [address] [place], [place]  [address] [phone] phone [phone] fax

## 2016-11-06 ENCOUNTER — Other Ambulatory Visit: Payer: Medicare HMO

## 2016-11-09 ENCOUNTER — Ambulatory Visit (INDEPENDENT_AMBULATORY_CARE_PROVIDER_SITE_OTHER): Payer: Medicare HMO | Admitting: Orthopedic Surgery

## 2016-12-14 ENCOUNTER — Telehealth: Payer: Self-pay | Admitting: Family Medicine

## 2016-12-14 DIAGNOSIS — F329 Major depressive disorder, single episode, unspecified: Secondary | ICD-10-CM

## 2016-12-14 DIAGNOSIS — F32A Depression, unspecified: Secondary | ICD-10-CM

## 2016-12-14 DIAGNOSIS — B351 Tinea unguium: Secondary | ICD-10-CM

## 2016-12-14 NOTE — Telephone Encounter (Signed)
Patient states she needs her Cymbalta refilled but it needs to be the 60 MG instead of the 30MG , states she use to take the 60MG  and it should be in her records. She also states that she needs her Lamisil refilled and please call both of these into the [place] on [facility]  Her call back number is [phone]-

## 2016-12-22 ENCOUNTER — Telehealth: Payer: Self-pay | Admitting: Family Medicine

## 2016-12-22 MED ORDER — TERBINAFINE HCL 250 MG PO TABS: 250.0000 mg | ORAL_TABLET | Freq: Every day | ORAL | 0 refills | Status: DC

## 2016-12-22 MED ORDER — DULOXETINE HCL 60 MG PO CPEP: 60.0000 mg | ORAL_CAPSULE | Freq: Every day | ORAL | 0 refills | Status: DC

## 2016-12-22 NOTE — Telephone Encounter (Signed)
I left a message asking the patient to call and schedule AWV-[facility]. [facility]. [facility]

## 2016-12-22 NOTE — Telephone Encounter (Signed)
Needs repeat hepatic function panel, please return as soon as possible for lab only visit. I did refill Lamisil temporarily, but need to make sure her liver tests are okay.  Refilled Cymbalta at 60 mg for 30 days, but follow-up with me in the next month to discuss that medication further. When I prescribed that in June, we had discussed follow-up with psychiatry to decide on other medications if needed, but I also see the notes from Dr. Tamala Julian in July.    I sent this information by Mychart to patient.

## 2017-01-13 ENCOUNTER — Ambulatory Visit (INDEPENDENT_AMBULATORY_CARE_PROVIDER_SITE_OTHER): Payer: Medicare HMO | Admitting: Family Medicine

## 2017-01-13 ENCOUNTER — Encounter: Payer: Self-pay | Admitting: Family Medicine

## 2017-01-13 VITALS — BP 138/82 | HR 88 | Temp 97.7°F | Resp 16 | Ht 58.27 in | Wt 173.0 lb

## 2017-01-13 DIAGNOSIS — M797 Fibromyalgia: Secondary | ICD-10-CM | POA: Diagnosis not present

## 2017-01-13 DIAGNOSIS — F310 Bipolar disorder, current episode hypomanic: Secondary | ICD-10-CM

## 2017-01-13 DIAGNOSIS — M51369 Other intervertebral disc degeneration, lumbar region without mention of lumbar back pain or lower extremity pain: Secondary | ICD-10-CM

## 2017-01-13 DIAGNOSIS — M4316 Spondylolisthesis, lumbar region: Secondary | ICD-10-CM

## 2017-01-13 DIAGNOSIS — E034 Atrophy of thyroid (acquired): Secondary | ICD-10-CM

## 2017-01-13 DIAGNOSIS — F329 Major depressive disorder, single episode, unspecified: Secondary | ICD-10-CM

## 2017-01-13 DIAGNOSIS — Z8669 Personal history of other diseases of the nervous system and sense organs: Secondary | ICD-10-CM | POA: Diagnosis not present

## 2017-01-13 DIAGNOSIS — F32A Depression, unspecified: Secondary | ICD-10-CM

## 2017-01-13 DIAGNOSIS — D329 Benign neoplasm of meninges, unspecified: Secondary | ICD-10-CM | POA: Diagnosis not present

## 2017-01-13 DIAGNOSIS — M5136 Other intervertebral disc degeneration, lumbar region: Secondary | ICD-10-CM | POA: Diagnosis not present

## 2017-01-13 MED ORDER — DULOXETINE HCL 60 MG PO CPEP: 60.0000 mg | ORAL_CAPSULE | Freq: Every day | ORAL | 5 refills | Status: DC

## 2017-01-13 NOTE — Progress Notes (Signed)
Subjective:    Patient ID: Emily Steele, female    DOB: 09/14/1955, 61 y.o.   MRN: [id number]  01/13/2017  Medication Refill (Cymbalta,Lamisil ); Paper Work (Disability Form ); Depression; Back Pain; and Hypothyroidism   HPI This 61 y.o. female presents for chronic medical follow-up and for disability paperwork completion for DDD lumbar spine.   Depression/anxiety: Traveled to see brother in May.  Able to move around really well.  Things that patient was afraid to do before, patient was able to do with no problems because had to do it.  Brother passed away in 2022-11-21. Had this phase where had auditory hallucinations; originally Christmas music; always occurred at home. Social worker by trade.  Discussed with psychiatrist who referred to neurology.  Underwent EEG 24 hour; really weird experience.  Ruled out seizure disorder.  Auditory hallucinations stopped; had encephalitis and brain tumor in the past.  Tumor was not cause of hallucinations; recurrent encephalitis had not recurred.   Recently, unable to kick self out of a depression. Also has been gaining a lot of weight.  No change in diet or activity level.  Requested Dr. Carlota Raspberry restart levothyroxine.  Also asked Dr. Carlota Raspberry to start Cymbalta 30mg .  Usually takes 60mg  daily.   Has been taking 60mg  daily.  Called Dr. Carlota Raspberry but declined refills. Medication ran out one week ago.  Laying in bed reading a book and music restarted.  Stopping the antidepressant was the trigger to recurrent auditory hallucinations; this same thing happened the previous time had auditory hallucinations.   So paranoid about encephalitis recurring.  Only one single episode of auditory hallucinations. Family in [place].  Has lost a brother, sister, and mother in past three years.  Has been really tough.  Feet are better.  Had to let dog go because cannot walk her.   Fibromyalgia: has pain everywhere.  No medication for fibromyalgia in a long time.  Needs medication  sometimes but definitely not daily.    GERD: totally gone.  Dietary modification; avoids french roast coffee and salt.    DDD lumbar: followed by Saintclair Halsted; another ruptured disc; referred to Dr. Moshe Cipro for R hip; recommended MRI hip; running out of money.  Disability initially for chronic lower back pain in 2010.  S/p lumbar surgeries x 2.  Dystrophic nails: Taking Lamisil since April 2018; due for repeat liver function tests.     BP Readings from Last 3 Encounters:  01/13/17 138/82  11/01/16 115/82  10/12/16 115/83   Wt Readings from Last 3 Encounters:  01/13/17 173 lb (78.5 kg)  11/01/16 171 lb (77.6 kg)  10/12/16 169 lb (76.7 kg)   Immunization History  Administered Date(s) Administered  . Influenza Split 02/29/2012  . Influenza,inj,Quad PF,6+ Mos 02/07/2013, 04/08/2014, 03/12/2015  . Influenza-Unspecified 03/17/2016  . Td 04/17/2010    Review of Systems  Constitutional: Negative for chills, diaphoresis, fatigue and fever.  Eyes: Negative for visual disturbance.  Respiratory: Negative for cough and shortness of breath.   Cardiovascular: Negative for chest pain, palpitations and leg swelling.  Gastrointestinal: Negative for abdominal pain, constipation, diarrhea, nausea and vomiting.  Endocrine: Negative for cold intolerance, heat intolerance, polydipsia, polyphagia and polyuria.  Musculoskeletal: Positive for arthralgias, back pain, gait problem and myalgias.  Neurological: Negative for dizziness, tremors, seizures, syncope, facial asymmetry, speech difficulty, weakness, light-headedness, numbness and headaches.  Psychiatric/Behavioral: Positive for dysphoric mood. Negative for self-injury and sleep disturbance. The patient is nervous/anxious.     Past Medical History:  Diagnosis Date  .  Allergy   . Anemia   . Anxiety   . Arthritis   . Brain tumor ([place])    right temporal, not ca  . Constipation   . DDD (degenerative disc disease)   . Depression   . Eczema   .  Fibromyalgia   . Fibromyalgia   . GERD (gastroesophageal reflux disease)   . Hearing loss    1 ear - mild  . Herpes simplex   . Hx of migraine headaches   . Hypertension    not on medications  . Hypothyroid   . Obesity   . Osteoarthritis   . Osteopenia   . Osteoporosis   . Plantar fasciitis   . Pneumonia 08/2012  . Rosacea   . Sleep behavior disorder, REM   . Toenail fungus   . UTI (urinary tract infection)    Past Surgical History:  Procedure Laterality Date  . ABDOMINAL HYSTERECTOMY    . HAND SURGERY Bilateral    ligimant transfere to thumbs  . LAMINECTOMY  08/18/2013   L 4 L5    DR CRAM  . MAXIMUM ACCESS (MAS)POSTERIOR LUMBAR INTERBODY FUSION (PLIF) 1 LEVEL N/A 08/18/2013   Procedure: FOR MAXIMUM ACCESS (MAS) POSTERIOR LUMBAR INTERBODY FUSION (PLIF) 1 LEVEL;  Surgeon: Elaina Hoops, MD;  Location: [facility];  Service: Neurosurgery;  Laterality: N/A;  FOR MAXIMUM ACCESS (MAS) POSTERIOR LUMBAR INTERBODY FUSION (PLIF) 1 LEVEL L4-5  . SPINE SURGERY     Lamincetomy and Disectomy L5- S1  . TONSILLECTOMY AND ADENOIDECTOMY     Allergies  Allergen Reactions  . Doxycycline Other (See Comments)    headaches  . Sulfa Antibiotics     Headaches  . Tape Itching  . Methocarbamol Nausea Only    headaches   Current Outpatient Prescriptions  Medication Sig Dispense Refill  . DULoxetine (CYMBALTA) 60 MG capsule Take 1-2 capsules (60-120 mg total) by mouth daily. 60 capsule 5  . levothyroxine (SYNTHROID, LEVOTHROID) 50 MCG tablet Take 1 tablet (50 mcg total) by mouth daily before breakfast. 90 tablet 1  . terbinafine (LAMISIL) 250 MG tablet Take 1 tablet (250 mg total) by mouth daily. 30 tablet 0   No current facility-administered medications for this visit.    Social History   Social History  . Marital status: Single    Spouse name: N/A  . Number of children: 0  . Years of education: college   Occupational History  .  Other    n/a   Social History Main Topics  . Smoking  status: Never Smoker  . Smokeless tobacco: Never Used  . Alcohol use No  . Drug use: No  . Sexual activity: No   Other Topics Concern  . Not on file   Social History Narrative   Patient lives at home alone.   Caffeine Use: 3-4 cups weekly   Single. Education: [employer].  Exercises 4x's a week.   Family History  Problem Relation Age of Onset  . Cancer Mother 17       lung cancer  . Hypertension Mother   . Cancer Sister        Breast cancer  . Heart disease Sister   . Asthma Sister   . Asthma Brother   . Drug abuse Brother   . Hypertension Brother        Objective:    BP 138/82   Pulse 88   Temp 97.7 F (36.5 C) (Oral)   Resp 16   Ht 4' 10.27" (1.48  m)   Wt 173 lb (78.5 kg)   SpO2 98%   BMI 35.83 kg/m  Physical Exam  Constitutional: She is oriented to person, place, and time. She appears well-developed and well-nourished. No distress.  HENT:  Head: Normocephalic and atraumatic.  Right Ear: External ear normal.  Left Ear: External ear normal.  Nose: Nose normal.  Mouth/Throat: Oropharynx is clear and moist.  Eyes: Pupils are equal, round, and reactive to light. Conjunctivae and EOM are normal.  Neck: Normal range of motion. Neck supple. Carotid bruit is not present. No thyromegaly present.  Cardiovascular: Normal rate, regular rhythm, normal heart sounds and intact distal pulses.  Exam reveals no gallop and no friction rub.   No murmur heard. Pulmonary/Chest: Effort normal and breath sounds normal. She has no wheezes. She has no rales.  Abdominal: Soft. Bowel sounds are normal. She exhibits no distension and no mass. There is no tenderness. There is no rebound and no guarding.  Musculoskeletal:       Lumbar back: She exhibits pain. She exhibits normal range of motion, no tenderness, no bony tenderness, no spasm and normal pulse.  Lumbar spine:  Non-tender midline; non-tender paraspinal regions B.  Straight leg raises negative B; toe and heel walking intact;  marching intact; motor 5/5 BLE.  Full ROM lumbar spine without limitation.   Lymphadenopathy:    She has no cervical adenopathy.  Neurological: She is alert and oriented to person, place, and time. No cranial nerve deficit. She exhibits normal muscle tone. Coordination normal.  Skin: Skin is warm and dry. No rash noted. She is not diaphoretic. No erythema. No pallor.  Psychiatric: She has a normal mood and affect. Her behavior is normal. Judgment and thought content normal.   Results for orders placed or performed in visit on 01/13/17  CBC with Differential/Platelet  Result Value Ref Range   WBC 6.1 3.4 - 10.8 x10E3/uL   RBC 5.81 (H) 3.77 - 5.28 x10E6/uL   Hemoglobin 16.8 (H) 11.1 - 15.9 g/dL   Hematocrit 48.7 (H) 34.0 - 46.6 %   MCV 84 79 - 97 fL   MCH 28.9 26.6 - 33.0 pg   MCHC 34.5 31.5 - 35.7 g/dL   RDW 14.3 12.3 - 15.4 %   Platelets 220 150 - 379 x10E3/uL   Neutrophils 68 Not Estab. %   Lymphs 25 Not Estab. %   Monocytes 5 Not Estab. %   Eos 2 Not Estab. %   Basos 0 Not Estab. %   Neutrophils Absolute 4.1 1.4 - 7.0 x10E3/uL   Lymphocytes Absolute 1.5 0.7 - 3.1 x10E3/uL   Monocytes Absolute 0.3 0.1 - 0.9 x10E3/uL   EOS (ABSOLUTE) 0.1 0.0 - 0.4 x10E3/uL   Basophils Absolute 0.0 0.0 - 0.2 x10E3/uL   Immature Granulocytes 0 Not Estab. %   Immature Grans (Abs) 0.0 0.0 - 0.1 x10E3/uL  Comprehensive metabolic panel  Result Value Ref Range   Glucose 103 (H) 65 - 99 mg/dL   BUN 17 8 - 27 mg/dL   Creatinine, Ser 0.86 0.57 - 1.00 mg/dL   GFR calc non Af Amer 74 >59 mL/min/1.73   GFR calc Af Amer 85 >59 mL/min/1.73   BUN/Creatinine Ratio 20 12 - 28   Sodium 142 134 - 144 mmol/L   Potassium 4.2 3.5 - 5.2 mmol/L   Chloride 101 96 - 106 mmol/L   CO2 24 20 - 29 mmol/L   Calcium 10.1 8.7 - 10.3 mg/dL   Total Protein 7.1 6.0 -  8.5 g/dL   Albumin 4.7 3.6 - 4.8 g/dL   Globulin, Total 2.4 1.5 - 4.5 g/dL   Albumin/Globulin Ratio 2.0 1.2 - 2.2   Bilirubin Total 0.5 0.0 - 1.2 mg/dL    Alkaline Phosphatase 130 (H) 39 - 117 IU/L   AST 20 0 - 40 IU/L   ALT 19 0 - 32 IU/L  T4, free  Result Value Ref Range   Free T4 1.80 (H) 0.82 - 1.77 ng/dL  TSH  Result Value Ref Range   TSH 2.210 0.450 - 4.500 uIU/mL   No results found. Depression screen [facility] 2/9 01/13/2017 11/01/2016 10/12/2016 09/23/2016 06/08/2016  Decreased Interest 0 0 3 0 0  Down, Depressed, Hopeless 0 0 3 0 0  PHQ - 2 Score 0 0 6 0 0  Altered sleeping - - 2 - -  Tired, decreased energy - - 2 - -  Change in appetite - - 2 - -  Feeling bad or failure about yourself  - - 2 - -  Trouble concentrating - - 0 - -  Moving slowly or fidgety/restless - - 0 - -  Suicidal thoughts - - 0 - -  PHQ-9 Score - - 14 - -   Fall Risk  01/13/2017 11/01/2016 10/12/2016 09/23/2016 06/08/2016  Falls in the past year? No No No No No  Number falls in past yr: - - - - -        Assessment & Plan:   1. Depression, unspecified depression type   2. Fibromyalgia   3. Hypothyroidism due to acquired atrophy of thyroid   4. Meningioma ([place])   5. Degenerative disc disease, lumbar   6. Spondylolisthesis at L4-L5 level   7. Bipolar affective disorder, current episode hypomanic ([place])   8. History of encephalopathy    -depression improved from one year ago with Cymbalta 60mg  daily; increase Cymbalta to 120mg  daily; refer to psychiatry at this time due to documented bipolar disorder; should like be maintained on mood stabilizer.   -s/p three months of Lamisil therapy; obtain LFTs; does not warrant further therapy.  Advised will take six months for toenails to grow out to determine efficacy of Lamisil therapy.  -repeat thyroid labs since maintained on supplementation. -pt has been on long-term disability since 2010 for DDD lumbar spine; forms to be completed yet pt continues to be followed by Dr. Saintclair Halsted; must obtain these orthopedic records.  Good mobility in the office.  I completed forms in 10/2015; willing to complete again; has suffered complex  medical conditions in the past eight years including herpetic encephalopathy, mengioma, auditory hallucinations, DDD lumbar spine, bipolar disorder.  Doing better today than I have every seen her despite ongoing depressive symptoms. -prolonged face-to-face for 40 minutes with greater than 50% of time dedicated to counseling and coordination of care and review of complex medical history.   Orders Placed This Encounter  Procedures  . CBC with Differential/Platelet  . Comprehensive metabolic panel  . T4, free  . TSH  . Ambulatory referral to Psychiatry    Referral Priority:   Routine    Referral Type:   Psychiatric    Referral Reason:   Specialty Services Required    Requested Specialty:   Psychiatry    Number of Visits Requested:   1   Meds ordered this encounter  Medications  . DULoxetine (CYMBALTA) 60 MG capsule    Sig: Take 1-2 capsules (60-120 mg total) by mouth daily.    Dispense:  60 capsule  Refill:  5    No Follow-up on file.   Henri Baumler Elayne Guerin, M.D. Primary Care at [facility] previously [facility] [place] [address] [place], [place]  [address] [phone] phone [phone] fax

## 2017-01-13 NOTE — Patient Instructions (Signed)
     IF you received an x-ray today, you will receive an invoice from [facility]. Please contact [facility] at [phone] with questions or concerns regarding your invoice.   IF you received labwork today, you will receive an invoice from [facility]. Please contact [facility] at [phone] with questions or concerns regarding your invoice.   Our billing staff will not be able to assist you with questions regarding bills from these companies.  You will be contacted with the lab results as soon as they are available. The fastest way to get your results is to activate your My Chart account. Instructions are located on the last page of this paperwork. If you have not heard from us regarding the results in 2 weeks, please contact this office.     

## 2017-01-14 LAB — COMPREHENSIVE METABOLIC PANEL
A/G RATIO: 2 (ref 1.2–2.2)
ALBUMIN: 4.7 g/dL (ref 3.6–4.8)
ALT: 19 IU/L (ref 0–32)
AST: 20 IU/L (ref 0–40)
Alkaline Phosphatase: 130 IU/L — ABNORMAL HIGH (ref 39–117)
BUN/Creatinine Ratio: 20 (ref 12–28)
BUN: 17 mg/dL (ref 8–27)
Bilirubin Total: 0.5 mg/dL (ref 0.0–1.2)
CALCIUM: 10.1 mg/dL (ref 8.7–10.3)
CO2: 24 mmol/L (ref 20–29)
Chloride: 101 mmol/L (ref 96–106)
Creatinine, Ser: 0.86 mg/dL (ref 0.57–1.00)
GFR, EST AFRICAN AMERICAN: 85 mL/min/{1.73_m2} (ref >59)
GFR, EST NON AFRICAN AMERICAN: 74 mL/min/{1.73_m2} (ref >59)
Globulin, Total: 2.4 g/dL (ref 1.5–4.5)
Glucose: 103 mg/dL — ABNORMAL HIGH (ref 65–99)
POTASSIUM: 4.2 mmol/L (ref 3.5–5.2)
Sodium: 142 mmol/L (ref 134–144)
TOTAL PROTEIN: 7.1 g/dL (ref 6.0–8.5)

## 2017-01-14 LAB — TSH: TSH: 2.21 u[IU]/mL (ref 0.450–4.500)

## 2017-01-14 LAB — CBC WITH DIFFERENTIAL/PLATELET
BASOS: 0 %
Basophils Absolute: 0 10*3/uL (ref 0.0–0.2)
EOS (ABSOLUTE): 0.1 10*3/uL (ref 0.0–0.4)
EOS: 2 %
HEMATOCRIT: 48.7 % — AB (ref 34.0–46.6)
HEMOGLOBIN: 16.8 g/dL — AB (ref 11.1–15.9)
IMMATURE GRANS (ABS): 0 10*3/uL (ref 0.0–0.1)
IMMATURE GRANULOCYTES: 0 %
LYMPHS: 25 %
Lymphocytes Absolute: 1.5 10*3/uL (ref 0.7–3.1)
MCH: 28.9 pg (ref 26.6–33.0)
MCHC: 34.5 g/dL (ref 31.5–35.7)
MCV: 84 fL (ref 79–97)
MONOCYTES: 5 %
Monocytes Absolute: 0.3 10*3/uL (ref 0.1–0.9)
NEUTROS PCT: 68 %
Neutrophils Absolute: 4.1 10*3/uL (ref 1.4–7.0)
PLATELETS: 220 10*3/uL (ref 150–379)
RBC: 5.81 x10E6/uL — ABNORMAL HIGH (ref 3.77–5.28)
RDW: 14.3 % (ref 12.3–15.4)
WBC: 6.1 10*3/uL (ref 3.4–10.8)

## 2017-01-14 LAB — T4, FREE: Free T4: 1.8 ng/dL — ABNORMAL HIGH (ref 0.82–1.77)

## 2017-01-16 ENCOUNTER — Telehealth: Payer: Self-pay | Admitting: Family Medicine

## 2017-01-16 NOTE — Telephone Encounter (Signed)
Patient came in on Saturday 01/13/17 and saw Dr Tamala Julian, she needs a Provider statement completed for 2018 year for her disability. I will place the blank form in Dr Darliss Ridgel box down at 104 please return to the FMLA/Disability desk in the back office within 5-7 business days. Thank you!

## 2017-01-26 NOTE — Telephone Encounter (Signed)
Form completion; will place on Disability desk for processing.

## 2017-01-29 ENCOUNTER — Encounter: Payer: Self-pay | Admitting: Family Medicine

## 2017-01-29 ENCOUNTER — Ambulatory Visit (INDEPENDENT_AMBULATORY_CARE_PROVIDER_SITE_OTHER): Payer: Medicare HMO | Admitting: Family Medicine

## 2017-01-29 DIAGNOSIS — N390 Urinary tract infection, site not specified: Secondary | ICD-10-CM | POA: Diagnosis not present

## 2017-01-29 DIAGNOSIS — N898 Other specified noninflammatory disorders of vagina: Secondary | ICD-10-CM

## 2017-01-29 LAB — POCT WET + KOH PREP
TRICH BY WET PREP: ABSENT
YEAST BY KOH: ABSENT
YEAST BY WET PREP: ABSENT

## 2017-01-29 MED ORDER — TERCONAZOLE 0.4 % VA CREA: 1.0000 | TOPICAL_CREAM | Freq: Two times a day (BID) | VAGINAL | 0 refills | Status: DC

## 2017-01-29 NOTE — Progress Notes (Signed)
Subjective:    Patient ID: Emily Steele, female    DOB: [birth year], 61 y.o.   MRN: [id number]  01/29/2017  Rash (in the vaginal area x 2 weeks )    HPI This 61 y.o. female presents for evaluation of vaginal rash onset two weeks ago. Vaginal burning; no itching; no discharge; no odor.  No pain with wiping.  Burning on outside.  No medications.  NO sexual activity.  S/p hysterectomy.  Appointment with urology next month.  Recurrent UTIs in the past year.     BP Readings from Last 3 Encounters:  01/13/17 138/82  11/01/16 115/82  10/12/16 115/83   Wt Readings from Last 3 Encounters:  01/13/17 173 lb (78.5 kg)  11/01/16 171 lb (77.6 kg)  10/12/16 169 lb (76.7 kg)   Immunization History  Administered Date(s) Administered  . Influenza Split 02/29/2012  . Influenza,inj,Quad PF,6+ Mos 02/07/2013, 04/08/2014, 03/12/2015  . Influenza-Unspecified 03/17/2016  . Td 04/17/2010    Review of Systems  Constitutional: Negative for chills, diaphoresis, fatigue and fever.  Eyes: Negative for visual disturbance.  Respiratory: Negative for cough and shortness of breath.   Cardiovascular: Negative for chest pain, palpitations and leg swelling.  Gastrointestinal: Negative for abdominal pain, constipation, diarrhea, nausea and vomiting.  Endocrine: Negative for cold intolerance, heat intolerance, polydipsia, polyphagia and polyuria.  Neurological: Negative for dizziness, tremors, seizures, syncope, facial asymmetry, speech difficulty, weakness, light-headedness, numbness and headaches.    Past Medical History:  Diagnosis Date  . Allergy   . Anemia   . Anxiety   . Arthritis   . Brain tumor ([facility])    right temporal, not ca  . Constipation   . DDD (degenerative disc disease)   . Depression   . Eczema   . Fibromyalgia   . Fibromyalgia   . GERD (gastroesophageal reflux disease)   . Hearing loss    1 ear - mild  . Herpes simplex   . Hx of migraine headaches   . Hypertension    not  on medications  . Hypothyroid   . Obesity   . Osteoarthritis   . Osteopenia   . Osteoporosis   . Plantar fasciitis   . Pneumonia 08/2012  . Rosacea   . Sleep behavior disorder, REM   . Toenail fungus   . UTI (urinary tract infection)    Past Surgical History:  Procedure Laterality Date  . ABDOMINAL HYSTERECTOMY     uterine fibroid; ovaries intact.  Marland Kitchen HAND SURGERY Bilateral    ligimant transfere to thumbs  . LAMINECTOMY  08/18/2013   L 4 L5    DR CRAM  . MAXIMUM ACCESS (MAS)POSTERIOR LUMBAR INTERBODY FUSION (PLIF) 1 LEVEL N/A 08/18/2013   Procedure: FOR MAXIMUM ACCESS (MAS) POSTERIOR LUMBAR INTERBODY FUSION (PLIF) 1 LEVEL;  Surgeon: Elaina Hoops, MD;  Location: [facility];  Service: Neurosurgery;  Laterality: N/A;  FOR MAXIMUM ACCESS (MAS) POSTERIOR LUMBAR INTERBODY FUSION (PLIF) 1 LEVEL L4-5  . SPINE SURGERY     Lamincetomy and Disectomy L5- S1  . TONSILLECTOMY AND ADENOIDECTOMY     Allergies  Allergen Reactions  . Doxycycline Other (See Comments)    headaches  . Sulfa Antibiotics     Headaches  . Tape Itching  . Methocarbamol Nausea Only    headaches   Current Outpatient Prescriptions on File Prior to Visit  Medication Sig Dispense Refill  . DULoxetine (CYMBALTA) 60 MG capsule Take 1-2 capsules (60-120 mg total) by mouth daily. 60 capsule 5  .  levothyroxine (SYNTHROID, LEVOTHROID) 50 MCG tablet Take 1 tablet (50 mcg total) by mouth daily before breakfast. 90 tablet 1   No current facility-administered medications on file prior to visit.    Social History   Social History  . Marital status: Single    Spouse name: N/A  . Number of children: 0  . Years of education: college   Occupational History  .  Other    n/a   Social History Main Topics  . Smoking status: Never Smoker  . Smokeless tobacco: Never Used  . Alcohol use No  . Drug use: No  . Sexual activity: No   Other Topics Concern  . Not on file   Social History Narrative   Patient lives at home alone.    Caffeine Use: 3-4 cups weekly   Single. Education: [employer].  Exercises 4x's a week.   Family History  Problem Relation Age of Onset  . Cancer Mother 58       lung cancer  . Hypertension Mother   . Cancer Sister        Breast cancer  . Heart disease Sister   . Asthma Sister   . Asthma Brother   . Drug abuse Brother   . Hypertension Brother        Objective:    There were no vitals taken for this visit. Physical Exam  Constitutional: She is oriented to person, place, and time. She appears well-developed and well-nourished. No distress.  HENT:  Head: Normocephalic and atraumatic.  Right Ear: External ear normal.  Left Ear: External ear normal.  Nose: Nose normal.  Mouth/Throat: Oropharynx is clear and moist.  Eyes: Pupils are equal, round, and reactive to light. Conjunctivae and EOM are normal.  Neck: Normal range of motion. Neck supple. Carotid bruit is not present. No thyromegaly present.  Cardiovascular: Normal rate, regular rhythm, normal heart sounds and intact distal pulses.  Exam reveals no gallop and no friction rub.   No murmur heard. Pulmonary/Chest: Effort normal and breath sounds normal. She has no wheezes. She has no rales.  Abdominal: Soft. Bowel sounds are normal. She exhibits no distension and no mass. There is no tenderness. There is no rebound and no guarding.  Lymphadenopathy:    She has no cervical adenopathy.  Neurological: She is alert and oriented to person, place, and time. No cranial nerve deficit.  Skin: Skin is warm and dry. No rash noted. She is not diaphoretic. No erythema. No pallor.  Psychiatric: She has a normal mood and affect. Her behavior is normal.   No results found. Depression screen [facility] 2/9 01/29/2017 01/13/2017 11/01/2016 10/12/2016 09/23/2016  Decreased Interest 0 0 0 3 0  Down, Depressed, Hopeless 0 0 0 3 0  PHQ - 2 Score 0 0 0 6 0  Altered sleeping - - - 2 -  Tired, decreased energy - - - 2 -  Change in appetite - - - 2 -  Feeling  bad or failure about yourself  - - - 2 -  Trouble concentrating - - - 0 -  Moving slowly or fidgety/restless - - - 0 -  Suicidal thoughts - - - 0 -  PHQ-9 Score - - - 14 -   Fall Risk  01/29/2017 01/13/2017 11/01/2016 10/12/2016 09/23/2016  Falls in the past year? No No No No No  Number falls in past yr: - - - - -        Assessment & Plan:   1. Vaginal irritation  2. Recurrent UTI     -New onset vaginal irritation; send wet prep; treat empirically with Terconazole; may also have intertrigo component; recommend Desitin or A&D ointment to area. -recurrent UTIs in the past year; upcoming appointment with urology this month.    Orders Placed This Encounter  Procedures  . POCT Wet + KOH Prep   Meds ordered this encounter  Medications  . terconazole (TERAZOL 7) 0.4 % vaginal cream    Sig: Place 1 applicator vaginally 2 (two) times daily.    Dispense:  45 g    Refill:  0    No Follow-up on file.   Cherisa Brucker Elayne Guerin, M.D. Primary Care at [facility] previously [facility] [address] [place], [place]  [address] [phone] phone [phone] fax

## 2017-01-29 NOTE — Patient Instructions (Addendum)
  Call Dr. Reginia Forts if there is no improvement in 10 ten days.   IF you received an x-ray today, you will receive an invoice from [facility]. Please contact [facility] at [phone] with questions or concerns regarding your invoice.   IF you received labwork today, you will receive an invoice from [facility]. Please contact [facility] at [phone] with questions or concerns regarding your invoice.   Our billing staff will not be able to assist you with questions regarding bills from these companies.  You will be contacted with the lab results as soon as they are available. The fastest way to get your results is to activate your My Chart account. Instructions are located on the last page of this paperwork. If you have not heard from [place] regarding the results in 2 weeks, please contact this office.

## 2017-01-29 NOTE — Telephone Encounter (Signed)
Paperwork scanned and faxed to [facility] on 01/29/17

## 2017-01-31 ENCOUNTER — Encounter (HOSPITAL_COMMUNITY): Payer: Self-pay | Admitting: Psychiatry

## 2017-01-31 ENCOUNTER — Ambulatory Visit (INDEPENDENT_AMBULATORY_CARE_PROVIDER_SITE_OTHER): Payer: Medicare HMO | Admitting: Psychiatry

## 2017-01-31 VITALS — BP 120/72 | HR 89 | Ht <= 58 in | Wt 177.2 lb

## 2017-01-31 DIAGNOSIS — F32A Depression, unspecified: Secondary | ICD-10-CM

## 2017-01-31 DIAGNOSIS — F5104 Psychophysiologic insomnia: Secondary | ICD-10-CM

## 2017-01-31 DIAGNOSIS — Z736 Limitation of activities due to disability: Secondary | ICD-10-CM

## 2017-01-31 DIAGNOSIS — F329 Major depressive disorder, single episode, unspecified: Secondary | ICD-10-CM | POA: Diagnosis not present

## 2017-01-31 DIAGNOSIS — F4010 Social phobia, unspecified: Secondary | ICD-10-CM | POA: Diagnosis not present

## 2017-01-31 DIAGNOSIS — G4700 Insomnia, unspecified: Secondary | ICD-10-CM

## 2017-01-31 DIAGNOSIS — R110 Nausea: Secondary | ICD-10-CM

## 2017-01-31 DIAGNOSIS — R5383 Other fatigue: Secondary | ICD-10-CM | POA: Diagnosis not present

## 2017-01-31 DIAGNOSIS — R4581 Low self-esteem: Secondary | ICD-10-CM

## 2017-01-31 DIAGNOSIS — R0602 Shortness of breath: Secondary | ICD-10-CM

## 2017-01-31 DIAGNOSIS — F410 Panic disorder [episodic paroxysmal anxiety] without agoraphobia: Secondary | ICD-10-CM | POA: Diagnosis not present

## 2017-01-31 DIAGNOSIS — G894 Chronic pain syndrome: Secondary | ICD-10-CM | POA: Diagnosis not present

## 2017-01-31 DIAGNOSIS — M2548 Effusion, other site: Secondary | ICD-10-CM

## 2017-01-31 DIAGNOSIS — R4584 Anhedonia: Secondary | ICD-10-CM

## 2017-01-31 DIAGNOSIS — R45851 Suicidal ideations: Secondary | ICD-10-CM

## 2017-01-31 DIAGNOSIS — G4721 Circadian rhythm sleep disorder, delayed sleep phase type: Secondary | ICD-10-CM | POA: Diagnosis not present

## 2017-01-31 DIAGNOSIS — R454 Irritability and anger: Secondary | ICD-10-CM

## 2017-01-31 DIAGNOSIS — F419 Anxiety disorder, unspecified: Secondary | ICD-10-CM | POA: Diagnosis not present

## 2017-01-31 DIAGNOSIS — R12 Heartburn: Secondary | ICD-10-CM

## 2017-01-31 DIAGNOSIS — M2550 Pain in unspecified joint: Secondary | ICD-10-CM

## 2017-01-31 DIAGNOSIS — R450 Nervousness: Secondary | ICD-10-CM

## 2017-01-31 DIAGNOSIS — Z813 Family history of other psychoactive substance abuse and dependence: Secondary | ICD-10-CM

## 2017-01-31 DIAGNOSIS — R4582 Worries: Secondary | ICD-10-CM

## 2017-01-31 DIAGNOSIS — R51 Headache: Secondary | ICD-10-CM

## 2017-01-31 MED ORDER — DULOXETINE HCL 60 MG PO CPEP: 120.0000 mg | ORAL_CAPSULE | Freq: Every day | ORAL | 1 refills | Status: DC

## 2017-01-31 MED ORDER — RAMELTEON 8 MG PO TABS: 8.0000 mg | ORAL_TABLET | Freq: Every day | ORAL | 1 refills | Status: DC

## 2017-01-31 NOTE — Progress Notes (Signed)
Psychiatric Initial Adult Assessment   Patient Identification: Emily Steele MRN:  [id number] Date of Evaluation:  01/31/2017 Referral Source: self Chief Complaint:   Visit Diagnosis:    ICD-10-CM   1. Psychophysiological insomnia F51.04 ramelteon (ROZEREM) 8 MG tablet  2. Depression, unspecified depression type F32.9 DULoxetine (CYMBALTA) 60 MG capsule  3. Chronic pain syndrome G89.4   4. 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 Presents today for med management for MDD.  Reports suffering with worsenign depression for the past 8-10 months, and having periods of passive SI.  She reports that her mood has been worse in the context of running out of psychiatric medications, and multiple family losses over the years.  She reports that her symptoms have gradually worsened.  She has low energy, anhedonia, sleep difficulties, poor self worth.  She struggles with poor appetite and stress eating.  She reports that she has tried to force herself to go on walks, go out of the home, do artwork, which typically uplift her mood, but these have failed.  Her PCP restarted her on cymbalta and titrated to 60 mg which has been partially helpful.  She reports associated anxiety and periods of panic, difficulty in social situations.  She tends to be fairly isolated at baseline. Is not married, no kids, and no extended family nearby.  She is considering getting a cat for company. She had previously had a dog that passed away 2-3 years ago.  She is on board with therapy and medication management.  Associated Signs/Symptoms: Depression Symptoms:  depressed mood, anhedonia, insomnia, fatigue, feelings of worthlessness/guilt, difficulty concentrating, hopelessness, recurrent thoughts of death, anxiety, panic attacks, disturbed sleep, (Hypo) Manic Symptoms:  Irritable Mood, Anxiety Symptoms:   Excessive Worry, Social Anxiety, Psychotic Symptoms:  none PTSD Symptoms: Negative  Past Psychiatric History: no prior psychiatric hospitalizations, she has made 3 suicide attempts in her life by overdose which she never told anyone about.  1 was at age 67.  2 suicide attempts 2-3 years ago after her mom, sister, brother, and dog died within a 6 month period.    Prior medication management with Dr. Toy Care   Previously on Cymbalta, wellbutrin, ativan, adderall, and restoril (together)  Previous Psychotropic Medications: Yes   Substance Abuse History in the last 12 months:  No.  Consequences of Substance Abuse: Negative  Past Medical History:  Past Medical History:  Diagnosis Date  . Allergy   . Anemia   . Anxiety   . Arthritis   . Brain tumor ([facility])    right temporal, not ca  . Constipation   . DDD (degenerative disc disease)   . Depression   . Eczema   . Fibromyalgia   . Fibromyalgia   . GERD (gastroesophageal reflux disease)   . Hearing loss    1 ear - mild  . Herpes simplex   . Hx of migraine headaches   . Hypertension    not on medications  . Hypothyroid   . Obesity   . Osteoarthritis   . Osteopenia   . Osteoporosis   . Plantar fasciitis   . Pneumonia 08/2012  . Rosacea   . Sleep behavior disorder, REM   . Toenail fungus   . UTI (urinary tract infection)     Past Surgical History:  Procedure Laterality Date  . ABDOMINAL HYSTERECTOMY     uterine fibroid; ovaries intact.  Marland Kitchen  HAND SURGERY Bilateral    ligimant transfere to thumbs  . LAMINECTOMY  08/18/2013   L 4 L5    DR CRAM  . MAXIMUM ACCESS (MAS)POSTERIOR LUMBAR INTERBODY FUSION (PLIF) 1 LEVEL N/A 08/18/2013   Procedure: FOR MAXIMUM ACCESS (MAS) POSTERIOR LUMBAR INTERBODY FUSION (PLIF) 1 LEVEL;  Surgeon: Elaina Hoops, MD;  Location: [facility];  Service: Neurosurgery;  Laterality: N/A;  FOR MAXIMUM ACCESS (MAS) POSTERIOR LUMBAR INTERBODY FUSION (PLIF) 1 LEVEL L4-5  . SPINE SURGERY     Lamincetomy and  Disectomy L5- S1  . TONSILLECTOMY AND ADENOIDECTOMY      Family Psychiatric History: family history of cocaine addiction  Family History:  Family History  Problem Relation Age of Onset  . Cancer Mother 25       lung cancer  . Hypertension Mother   . Cancer Sister        Breast cancer  . Heart disease Sister   . Asthma Sister   . Asthma Brother   . Drug abuse Brother   . Hypertension Brother     Social History:   Social History   Social History  . Marital status: Single    Spouse name: N/A  . Number of children: 0  . Years of education: college   Occupational History  .  Other    n/a   Social History Main Topics  . Smoking status: Never Smoker  . Smokeless tobacco: Never Used  . Alcohol use No  . Drug use: No  . Sexual activity: No   Other Topics Concern  . None   Social History Narrative   Patient lives at home alone.   Caffeine Use: 3-4 cups weekly   Single. Education: [employer].  Exercises 4x's a week.    Additional Social History: medical disability, previously LCSW at the [employer]  Allergies:   Allergies  Allergen Reactions  . Doxycycline Other (See Comments)    headaches  . Sulfa Antibiotics     Headaches  . Tape Itching  . Methocarbamol Nausea Only    headaches    Metabolic Disorder Labs: Lab Results  Component Value Date   HGBA1C 5.4 07/10/2014   No results found for: PROLACTIN No results found for: CHOL, TRIG, HDL, CHOLHDL, VLDL, LDLCALC   Current Medications: Current Outpatient Prescriptions  Medication Sig Dispense Refill  . DULoxetine (CYMBALTA) 60 MG capsule Take 2 capsules (120 mg total) by mouth daily. 180 capsule 1  . levothyroxine (SYNTHROID, LEVOTHROID) 50 MCG tablet Take 1 tablet (50 mcg total) by mouth daily before breakfast. 90 tablet 1  . ramelteon (ROZEREM) 8 MG tablet Take 1 tablet (8 mg total) by mouth at bedtime. 90 tablet 1   No current facility-administered medications for this visit.      Neurologic: Headache: Yes Seizure: NA Paresthesias:Yes  Musculoskeletal: Strength & Muscle Tone: decreased Gait & Station: unsteady Patient leans: N/A  Psychiatric Specialty Exam: Review of Systems  Constitutional: Negative.   HENT: Negative.   Eyes: Negative.   Respiratory: Positive for shortness of breath.   Cardiovascular: Positive for leg swelling.  Gastrointestinal: Positive for heartburn and nausea.  Musculoskeletal: Positive for back pain and myalgias.  Neurological: Positive for tingling, focal weakness and headaches.  Psychiatric/Behavioral: Positive for depression and suicidal ideas. The patient is nervous/anxious.     Blood pressure 120/72, pulse 89, height 4\' 10"  (1.473 m), weight 177 lb 3.2 oz (80.4 kg).Body mass index is 37.03 kg/m.  General Appearance: Casual and Fairly Groomed  Eye Contact:  Fair  Speech:  Clear and Coherent  Volume:  Normal  Mood:  Depressed and Dysphoric  Affect:  Congruent  Thought Process:  Coherent, Goal Directed and Descriptions of Associations: Intact  Orientation:  Full (Time, Place, and Person)  Thought Content:  Logical  Suicidal Thoughts:  Yes.  without intent/plan  Homicidal Thoughts:  No  Memory:  Immediate;   Fair  Judgement:  Fair  Insight:  Fair  Psychomotor Activity:  Normal  Concentration:  Attention Span: Fair  Recall:  [employer] of Knowledge:Good  Language: Good  Akathisia:  Negative  Handed:  Right  AIMS (if indicated):  0  Assets:  Communication Skills Desire for Improvement  ADL's:  Intact  Cognition: WNL  Sleep:  4-6 hours, circadian delay    Treatment Plan Summary: Emily Steele is a 61 year old female with longstanding major depressive disorder, worsening over the past 9 months in the setting of lack of care due to insurance issues (switching from private to medicare and briefly uninsured).  She has a history of fairly poor family and social support.  She is minimizing at times on interview and  requires further clarification.  She has 3 prior suicide attempts which she admits that she never sought help for after the attempts.  She is high risk for harm to herself, currently denies any acute SI. She agrees to participate in care as below.  1. Psychophysiological insomnia   2. Depression, unspecified depression type   3. Chronic pain syndrome   4. Sleep disorder, circadian, delayed sleep phase type    Status of current problems: gradually worsening  Labs Ordered: No orders of the defined types were placed in this encounter.  Labs Reviewed:  Lab Results  Component Value Date   TSH 2.210 01/13/2017    Collateral Obtained/Records Reviewed: reviewed PCP notes regarding thyroid management and depression treatment  Plan:  Increase cymbalta to 120 mg daily Initiate ramelteon for insomnia and circadian sleep delay Individual therapy referral with Evelena Peat  I spent 60 minutes with the patient in direct face-to-face clinical care.  Greater than 50% of this time was spent in counseling and coordination of care with the patient.   Aundra Dubin, MD 10/17/20182:43 PM

## 2017-02-07 ENCOUNTER — Telehealth (HOSPITAL_COMMUNITY): Payer: Self-pay

## 2017-02-07 DIAGNOSIS — F5104 Psychophysiologic insomnia: Secondary | ICD-10-CM

## 2017-02-07 MED ORDER — TRAZODONE HCL 50 MG PO TABS: 50.0000 mg | ORAL_TABLET | Freq: Every day | ORAL | 0 refills | Status: DC

## 2017-02-07 NOTE — Telephone Encounter (Signed)
Wow that is expensive.  We can try mirtazepine (Remeron) 15 mg nightly.  If she has already taken that, we can do trazodone 50 mg nightly.

## 2017-02-07 NOTE — Telephone Encounter (Signed)
Spoke to pt and she has already tried Remeron. She wants to try Trazodone. Sent Trazodone 50mg  at bedtime to [facility] as requested by pt. She understands it is one tab at bedtime.

## 2017-02-07 NOTE — Telephone Encounter (Signed)
Patient called, she said that even with insurance approval it will cost $280. She can not afford that and would like to know if you can prescribe something different. Please review and advise, thank you

## 2017-02-13 ENCOUNTER — Telehealth: Payer: Self-pay | Admitting: Family Medicine

## 2017-02-13 NOTE — Telephone Encounter (Signed)
[employer] needs more information on this patients condition and what she can/cannot do at work. I will place the forms in Dr Darliss Ridgel box on 02/13/17 please return to the FMLA/Disability desk in the back office at 104 within 5-7 business days. Thank you!

## 2017-02-20 NOTE — Telephone Encounter (Signed)
Form completed; will return to [facility] desk on 02/21/17.

## 2017-02-27 NOTE — Telephone Encounter (Signed)
There was a back side to these forms. I will place them back in your box on 02/27/17 just return them to my desk when you complete them. Thank you!

## 2017-03-05 DIAGNOSIS — Z0271 Encounter for disability determination: Secondary | ICD-10-CM

## 2017-03-12 ENCOUNTER — Telehealth (HOSPITAL_COMMUNITY): Payer: Self-pay

## 2017-03-12 ENCOUNTER — Other Ambulatory Visit (HOSPITAL_COMMUNITY): Payer: Self-pay | Admitting: Psychiatry

## 2017-03-12 DIAGNOSIS — F5104 Psychophysiologic insomnia: Secondary | ICD-10-CM

## 2017-03-12 MED ORDER — TRAZODONE HCL 50 MG PO TABS: 50.0000 mg | ORAL_TABLET | Freq: Every day | ORAL | 2 refills | Status: DC

## 2017-03-12 NOTE — Telephone Encounter (Signed)
Yes that is fine, I will send it right now

## 2017-03-12 NOTE — Telephone Encounter (Signed)
Have we completed the back side of these forms yet?

## 2017-03-12 NOTE — Telephone Encounter (Signed)
Medication refill request - Fax received from [facility] for a new Trazodone order for patient. Last provided for 30 days 01/31/17 and pt. does not return until 03/29/17

## 2017-03-14 DIAGNOSIS — R32 Unspecified urinary incontinence: Secondary | ICD-10-CM | POA: Diagnosis not present

## 2017-03-14 DIAGNOSIS — R300 Dysuria: Secondary | ICD-10-CM | POA: Diagnosis not present

## 2017-03-21 ENCOUNTER — Other Ambulatory Visit (HOSPITAL_COMMUNITY): Payer: Self-pay

## 2017-03-21 DIAGNOSIS — F329 Major depressive disorder, single episode, unspecified: Secondary | ICD-10-CM

## 2017-03-21 DIAGNOSIS — F5104 Psychophysiologic insomnia: Secondary | ICD-10-CM

## 2017-03-21 DIAGNOSIS — F32A Depression, unspecified: Secondary | ICD-10-CM

## 2017-03-21 MED ORDER — DULOXETINE HCL 60 MG PO CPEP: 120.0000 mg | ORAL_CAPSULE | Freq: Every day | ORAL | 0 refills | Status: DC

## 2017-03-21 MED ORDER — TRAZODONE HCL 50 MG PO TABS: 50.0000 mg | ORAL_TABLET | Freq: Every day | ORAL | 0 refills | Status: DC

## 2017-03-21 NOTE — Telephone Encounter (Signed)
Have we finished these forms yet?

## 2017-03-22 ENCOUNTER — Telehealth: Payer: Self-pay | Admitting: Family Medicine

## 2017-03-22 ENCOUNTER — Other Ambulatory Visit (HOSPITAL_COMMUNITY): Payer: Self-pay

## 2017-03-22 DIAGNOSIS — R300 Dysuria: Secondary | ICD-10-CM | POA: Diagnosis not present

## 2017-03-22 DIAGNOSIS — F5104 Psychophysiologic insomnia: Secondary | ICD-10-CM

## 2017-03-22 DIAGNOSIS — F329 Major depressive disorder, single episode, unspecified: Secondary | ICD-10-CM

## 2017-03-22 DIAGNOSIS — F32A Depression, unspecified: Secondary | ICD-10-CM

## 2017-03-22 DIAGNOSIS — R8271 Bacteriuria: Secondary | ICD-10-CM | POA: Diagnosis not present

## 2017-03-22 MED ORDER — DULOXETINE HCL 60 MG PO CPEP: 120.0000 mg | ORAL_CAPSULE | Freq: Every day | ORAL | 0 refills | Status: DC

## 2017-03-22 NOTE — Telephone Encounter (Signed)
Copied from [place] [id number]. Topic: General - Other >> Mar 22, 2017  8:33 AM Darl Householder, RMA wrote: Reason for CRM: Medication refill request for Levothyroxine 50 mcg to be sent to [facility]

## 2017-03-23 MED ORDER — LEVOTHYROXINE SODIUM 50 MCG PO TABS: 50.0000 ug | ORAL_TABLET | Freq: Every day | ORAL | 1 refills | Status: DC

## 2017-03-23 NOTE — Telephone Encounter (Signed)
Pt called and notified medication would be refilled ans sent to pharmacy.

## 2017-03-29 ENCOUNTER — Ambulatory Visit (HOSPITAL_COMMUNITY): Payer: Medicare HMO | Admitting: Psychiatry

## 2017-03-29 ENCOUNTER — Encounter (HOSPITAL_COMMUNITY): Payer: Self-pay | Admitting: Psychiatry

## 2017-03-29 VITALS — BP 142/96 | HR 84 | Temp 98.0°F | Resp 18 | Ht 59.0 in | Wt 171.4 lb

## 2017-03-29 DIAGNOSIS — F5104 Psychophysiologic insomnia: Secondary | ICD-10-CM

## 2017-03-29 DIAGNOSIS — Z813 Family history of other psychoactive substance abuse and dependence: Secondary | ICD-10-CM | POA: Diagnosis not present

## 2017-03-29 DIAGNOSIS — F329 Major depressive disorder, single episode, unspecified: Secondary | ICD-10-CM | POA: Diagnosis not present

## 2017-03-29 DIAGNOSIS — F32A Depression, unspecified: Secondary | ICD-10-CM

## 2017-03-29 MED ORDER — BUPROPION HCL ER (SR) 150 MG PO TB12: 150.0000 mg | ORAL_TABLET | Freq: Every day | ORAL | 1 refills | Status: DC

## 2017-03-29 MED ORDER — TEMAZEPAM 15 MG PO CAPS: 15.0000 mg | ORAL_CAPSULE | Freq: Every evening | ORAL | 3 refills | Status: DC | PRN

## 2017-03-29 MED ORDER — TEMAZEPAM 15 MG PO CAPS: 15.0000 mg | ORAL_CAPSULE | Freq: Every evening | ORAL | 1 refills | Status: DC | PRN

## 2017-03-29 MED ORDER — DULOXETINE HCL 60 MG PO CPEP: 120.0000 mg | ORAL_CAPSULE | Freq: Two times a day (BID) | ORAL | 1 refills | Status: DC

## 2017-03-29 NOTE — Telephone Encounter (Signed)
Paperwork scanned and faxed to [facility] on 03/29/17

## 2017-03-29 NOTE — Progress Notes (Signed)
BH MD/PA/NP OP Progress Note  03/29/2017 10:01 AM Emily Steele  MRN:  [id number]  Chief Complaint:  Chief Complaint    Follow-up     HPI: Emily Steele reports that Cymbalta 120 mg is doing fine, she takes 60 mg in the morning and 60 mg at lunch.  She reports that she has not been able to sleep with the trazodone and it caused nightmares so she stopped it.  She was not able to get the Rozerem because of cost.  She has previously been on temazepam with benefit, we agreed to restart and I educated her on the risks of long-term benzodiazepine use including falls and neurocognitive side effects.  She feels that the risk is worth the benefit of sleeping consistently.  She would also like to start Wellbutrin which she has had good luck with in the past during the winter to prevent seasonal worsening of depression.  We agreed to start 150 mg SR in the morning.  We will follow-up in 3 months, spent time discussing some self-care habits, and she continues to be excited about her art work and painting activities.  She denies any plans or intention to harm herself and reports that the suicidal thoughts have been very rare, and fleeting.  Visit Diagnosis:    ICD-10-CM   1. Psychophysiological insomnia F51.04   2. Depression, unspecified depression type F32.9 DULoxetine (CYMBALTA) 60 MG capsule    buPROPion (WELLBUTRIN SR) 150 MG 12 hr tablet    Past Psychiatric History: See intake H&P for full details. Reviewed, with no updates at this time.   Past Medical History:  Past Medical History:  Diagnosis Date  . Allergy   . Anemia   . Anxiety   . Arthritis   . Brain tumor ([place])    right temporal, not ca  . Constipation   . DDD (degenerative disc disease)   . Depression   . Eczema   . Fibromyalgia   . Fibromyalgia   . GERD (gastroesophageal reflux disease)   . Hearing loss    1 ear - mild  . Herpes simplex   . Hx of migraine headaches   . Hypertension    not on medications  . Hypothyroid    . Obesity   . Osteoarthritis   . Osteopenia   . Osteoporosis   . Plantar fasciitis   . Pneumonia 08/2012  . Rosacea   . Sleep behavior disorder, REM   . Toenail fungus   . UTI (urinary tract infection)     Past Surgical History:  Procedure Laterality Date  . ABDOMINAL HYSTERECTOMY     uterine fibroid; ovaries intact.  Marland Kitchen HAND SURGERY Bilateral    ligimant transfere to thumbs  . LAMINECTOMY  08/18/2013   L 4 L5    DR CRAM  . MAXIMUM ACCESS (MAS)POSTERIOR LUMBAR INTERBODY FUSION (PLIF) 1 LEVEL N/A 08/18/2013   Procedure: FOR MAXIMUM ACCESS (MAS) POSTERIOR LUMBAR INTERBODY FUSION (PLIF) 1 LEVEL;  Surgeon: Elaina Hoops, MD;  Location: [facility];  Service: Neurosurgery;  Laterality: N/A;  FOR MAXIMUM ACCESS (MAS) POSTERIOR LUMBAR INTERBODY FUSION (PLIF) 1 LEVEL L4-5  . SPINE SURGERY     Lamincetomy and Disectomy L5- S1  . TONSILLECTOMY AND ADENOIDECTOMY      Family Psychiatric History: See intake H&P for full details. Reviewed, with no updates at this time.   Family History:  Family History  Problem Relation Age of Onset  . Cancer Mother 66  lung cancer  . Hypertension Mother   . Cancer Sister        Breast cancer  . Heart disease Sister   . Asthma Sister   . Asthma Brother   . Drug abuse Brother   . Hypertension Brother     Social History:  Social History   Socioeconomic History  . Marital status: Single    Spouse name: None  . Number of children: 0  . Years of education: college  . Highest education level: None  Social Needs  . Financial resource strain: None  . Food insecurity - worry: None  . Food insecurity - inability: None  . Transportation needs - medical: None  . Transportation needs - non-medical: None  Occupational History    Employer: OTHER    Comment: n/a  Tobacco Use  . Smoking status: Never Smoker  . Smokeless tobacco: Never Used  Substance and Sexual Activity  . Alcohol use: No    Alcohol/week: 0.0 oz  . Drug use: No  . Sexual  activity: No  Other Topics Concern  . None  Social History Narrative   Patient lives at home alone.   Caffeine Use: 3-4 cups weekly   Single. Education: [employer].  Exercises 4x's a week.    Allergies:  Allergies  Allergen Reactions  . Doxycycline Other (See Comments)    headaches  . Sulfa Antibiotics     Headaches  . Tape Itching  . Methocarbamol Nausea Only    headaches    Metabolic Disorder Labs: Lab Results  Component Value Date   HGBA1C 5.4 07/10/2014   No results found for: PROLACTIN No results found for: CHOL, TRIG, HDL, CHOLHDL, VLDL, LDLCALC Lab Results  Component Value Date   TSH 2.210 01/13/2017   TSH 4.090 10/12/2016    Therapeutic Level Labs: No results found for: LITHIUM No results found for: VALPROATE No components found for:  CBMZ  Current Medications: Current Outpatient Medications  Medication Sig Dispense Refill  . DULoxetine (CYMBALTA) 60 MG capsule Take 2 capsules (120 mg total) by mouth 2 (two) times daily. 180 capsule 1  . levothyroxine (SYNTHROID, LEVOTHROID) 50 MCG tablet Take 1 tablet (50 mcg total) by mouth daily before breakfast. 90 tablet 1  . buPROPion (WELLBUTRIN SR) 150 MG 12 hr tablet Take 1 tablet (150 mg total) by mouth daily. 90 tablet 1  . temazepam (RESTORIL) 15 MG capsule Take 1 capsule (15 mg total) by mouth at bedtime as needed for sleep. 90 capsule 1   No current facility-administered medications for this visit.      Musculoskeletal: Strength & Muscle Tone: within normal limits Gait & Station: normal Patient leans: N/A  Psychiatric Specialty Exam: ROS  Blood pressure (!) 142/96, pulse 84, temperature 98 F (36.7 C), temperature source Oral, resp. rate 18, height 4\' 11"  (1.499 m), weight 171 lb 6.4 oz (77.7 kg), SpO2 99 %.Body mass index is 34.62 kg/m.  General Appearance: Casual and Fairly Groomed  Eye Contact:  Good  Speech:  Clear and Coherent  Volume:  Normal  Mood:  Euthymic  Affect:  Appropriate and  Congruent  Thought Process:  Coherent, Goal Directed and Descriptions of Associations: Intact  Orientation:  Full (Time, Place, and Person)  Thought Content: Logical   Suicidal Thoughts:  No  Homicidal Thoughts:  No  Memory:  Immediate;   Fair  Judgement:  Fair  Insight:  Shallow  Psychomotor Activity:  Normal  Concentration:  Concentration: Fair  Recall:  [employer]  of Knowledge: Good  Language: Good  Akathisia:  Negative  Handed:  Right  AIMS (if indicated): not done  Assets:  Communication Skills Desire for Improvement Financial Resources/Insurance Housing  ADL's:  Intact  Cognition: WNL  Sleep:  Poor   Screenings: PHQ2-9     Office Visit from 01/29/2017 in [facility] at [facility] from 01/13/2017 in [facility] at [place] from 11/01/2016 in [facility] at [facility] from 10/12/2016 in [facility] at [place] from 09/23/2016 in [place] at [facility] Total Score  0  0  0  6  0  PHQ-9 Total Score  No data  No data  No data  14  No data       Assessment and Plan:  EZMERALDA STEFANICK presents with mood generally improving with Cymbalta, some difficulty sleeping with trazodone and nightmares.   She continues to engaged fairly superficially, and tends to be focused on medications, rather than having a mindful discussion of her habits and intrinsic thought processes.  We agreed to restart Restoril which she had been on in the past with benefit for nearly 5 years.  She tends to have a little depression flare during the winter, so we agreed to start Wellbutrin to help support her mood.  No acute safety issues or suicidality at this time and we will follow-up in 3 months.   1. Psychophysiological insomnia   2. Depression, unspecified depression type     Status of current problems: stable  Labs Ordered: No orders of the defined types were placed in this encounter.   Labs Reviewed: n/a  Collateral Obtained/Records Reviewed:  n/a  Plan:  Continue Cymbalta 60 mg twice daily Trazodone discontinued given nightmares Restart Restoril 15 mg nightly Restart Wellbutrin 150 mg SR in the morning RTC 3 months  I spent 20 minutes with the patient in direct face-to-face clinical care.  Greater than 50% of this time was spent in counseling and coordination of care with the patient.    Aundra Dubin, MD 03/29/2017, 10:01 AM

## 2017-03-30 ENCOUNTER — Other Ambulatory Visit (HOSPITAL_COMMUNITY): Payer: Self-pay

## 2017-03-30 DIAGNOSIS — F32A Depression, unspecified: Secondary | ICD-10-CM

## 2017-03-30 DIAGNOSIS — F329 Major depressive disorder, single episode, unspecified: Secondary | ICD-10-CM

## 2017-03-30 MED ORDER — DULOXETINE HCL 60 MG PO CPEP: 60.0000 mg | ORAL_CAPSULE | Freq: Two times a day (BID) | ORAL | 1 refills | Status: DC

## 2017-04-26 ENCOUNTER — Telehealth (HOSPITAL_COMMUNITY): Payer: Self-pay | Admitting: *Deleted

## 2017-04-26 NOTE — Telephone Encounter (Signed)
Received prior authorization for Cymbalta. Called [phone] with ID #[id number] was told by Joy that plan expired 04/17/17 and authorization can't be complete. Will notify office staff to obtain current insurance information.

## 2017-04-30 ENCOUNTER — Other Ambulatory Visit: Payer: Self-pay | Admitting: Family Medicine

## 2017-04-30 MED ORDER — TRETINOIN 0.05 % EX CREA: TOPICAL_CREAM | Freq: Every day | CUTANEOUS | 1 refills | Status: DC

## 2017-04-30 NOTE — Progress Notes (Signed)
Received fax communication requesting refill of Tretinoin. Last refilled 11/2015.

## 2017-05-01 ENCOUNTER — Other Ambulatory Visit: Payer: Self-pay | Admitting: Family Medicine

## 2017-05-01 ENCOUNTER — Telehealth: Payer: Self-pay | Admitting: Family Medicine

## 2017-05-01 MED ORDER — CLINDAMYCIN PHOSPHATE 1 % EX LOTN: TOPICAL_LOTION | Freq: Two times a day (BID) | CUTANEOUS | 0 refills | Status: DC

## 2017-05-01 MED ORDER — LEVOTHYROXINE SODIUM 50 MCG PO TABS: 50.0000 ug | ORAL_TABLET | Freq: Every day | ORAL | 1 refills | Status: AC

## 2017-05-01 NOTE — Telephone Encounter (Signed)
[facility] pharamcy requesting rx sent on Levothyroxine, Valtrex, Clindamycin, Voltaren gel, Flector, Lidodderm, Skelaxin, Tretinoin.   Refills of Levothyroxine, Tretinoin, Clindamycin provided.  Do not prescribe other medications.

## 2017-05-15 ENCOUNTER — Ambulatory Visit: Payer: Self-pay | Admitting: *Deleted

## 2017-05-15 ENCOUNTER — Ambulatory Visit (HOSPITAL_COMMUNITY)
Admission: EM | Admit: 2017-05-15 | Discharge: 2017-05-15 | Disposition: A | Discharge status: Home / Self Care | Payer: Medicare HMO | Attending: Family Medicine | Admitting: Family Medicine

## 2017-05-15 ENCOUNTER — Other Ambulatory Visit: Payer: Self-pay

## 2017-05-15 ENCOUNTER — Encounter (HOSPITAL_COMMUNITY): Payer: Self-pay | Admitting: Emergency Medicine

## 2017-05-15 DIAGNOSIS — W540XXA Bitten by dog, initial encounter: Secondary | ICD-10-CM

## 2017-05-15 MED ORDER — FLUCONAZOLE 150 MG PO TABS: ORAL_TABLET | ORAL | 0 refills | Status: DC

## 2017-05-15 MED ORDER — AMOXICILLIN-POT CLAVULANATE 875-125 MG PO TABS: 1.0000 | ORAL_TABLET | Freq: Two times a day (BID) | ORAL | 0 refills | Status: DC

## 2017-05-15 NOTE — ED Triage Notes (Addendum)
Patient reports a neighbors dog bit her today.  Patient has broken skin to left hand, bruising, and swelling, slight pain.  Neighbor told patient shots are current.  Patient's last td was 2012

## 2017-05-15 NOTE — Telephone Encounter (Signed)
Pt called stating that she has experienced a dog bite to left hand at knuckle; no bleeding but the skin was broken at the middle finger; her index and middle fingers are swollen and bruised; nurse triage initiated and recommendations made per protocol;  and pt instructed to go to ED; pt verbalizes understanding; will route to [facility] for notification of encounter.   Reason for Disposition . [1] Any break in skin (e.g., cut, puncture or scratch) AND[2] dog, cat, or ferret at risk for RABIES (e.g., sick, stray, unprovoked bite, developing country)  Answer Assessment - Initial Assessment Questions 1. ANIMAL: "What type of animal caused the bite?" "Is the injury from a bite or a claw?" If the animal is a dog or a cat, ask: "Was it a pet or a stray?" "Was it acting ill or behaving strangely?"     Dog bite; pet was on leash; dark was barking which is not unusual 2. LOCATION: "Where is the bite located?"      Left hand index finger 3. SIZE: "How big is the bite?" "What does it look like?"      1/2 inch; looks like the dog's tooth scraped the area 4. ONSET: "When did the bite happen?" (Minutes or hours ago)      1345 05/15/16 5. CIRCUMSTANCES: "Tell me how this happened."      Dog was on leash and pt was talking to her neighbor when the dog jumped up and bit her had 6. TETANUS: "When was the last tetanus booster?"     unsure 7. PREGNANCY: "Is there any chance you are pregnant?" "When was your last menstrual period?"     no  Protocols used: ANIMAL BITE-A-AH

## 2017-05-15 NOTE — Medical Student Note (Signed)
[facility] Statistician Note For educational purposes for Medical, PA and NP students only and not part of the legal medical record.   CSN: [id number] Arrival date & time: 05/15/17  1447     History   Chief Complaint Chief Complaint  Patient presents with  . Animal Bite    HPI Emily Steele is a 62 y.o. female.  HPI 62 year old white female presents following a dog bite to her left hand earlier today. Neighbor's dog on a leash bit her while she was talking to the owner. Per owner, dog is UTD with immunizations. States bleeding was minimal and swelling has since improved dramatically. She has never had to receive rabies shots in the past. Denies blood thinner use. No fever/chills, altered sensation, weakness, excessive bleeding. Tetanus UTD 2012.   Past Medical History:  Diagnosis Date  . Allergy   . Anemia   . Anxiety   . Arthritis   . Brain tumor ([place])    right temporal, not ca  . Constipation   . DDD (degenerative disc disease)   . Depression   . Eczema   . Fibromyalgia   . Fibromyalgia   . GERD (gastroesophageal reflux disease)   . Hearing loss    1 ear - mild  . Herpes simplex   . Hx of migraine headaches   . Hypertension    not on medications  . Hypothyroid   . Obesity   . Osteoarthritis   . Osteopenia   . Osteoporosis   . Plantar fasciitis   . Pneumonia 08/2012  . Rosacea   . Sleep behavior disorder, REM   . Toenail fungus   . UTI (urinary tract infection)     Patient Active Problem List   Diagnosis Date Noted  . Degenerative disc disease, lumbar 10/20/2015  . History of encephalopathy 10/20/2015  . Family history of breast cancer in first degree relative 04/13/2015  . Anemia, iron deficiency 04/13/2015  . Vitamin D deficiency 04/13/2015  . Rosacea, acne 03/16/2015  . Meningioma ([place]) 01/07/2015  . Cognitive impairment 01/07/2015  . Spondylolisthesis at L4-L5 level 08/18/2013  . 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 H/O  vitamin D deficiency 08/13/2011  . Major Depressive Disorder 05/17/2011  . Fibromyalgia 05/17/2011  . Hypothyroidism 05/17/2011  . Sleep behavior disorder, REM 05/17/2011  . Osteopenia 05/17/2011  . Obesity 05/17/2011  . Plantar fasciitis 05/17/2011    Past Surgical History:  Procedure Laterality Date  . ABDOMINAL HYSTERECTOMY     uterine fibroid; ovaries intact.  Marland Kitchen HAND SURGERY Bilateral    ligimant transfere to thumbs  . LAMINECTOMY  08/18/2013   L 4 L5    DR CRAM  . MAXIMUM ACCESS (MAS)POSTERIOR LUMBAR INTERBODY FUSION (PLIF) 1 LEVEL N/A 08/18/2013   Procedure: FOR MAXIMUM ACCESS (MAS) POSTERIOR LUMBAR INTERBODY FUSION (PLIF) 1 LEVEL;  Surgeon: Elaina Hoops, MD;  Location: [facility];  Service: Neurosurgery;  Laterality: N/A;  FOR MAXIMUM ACCESS (MAS) POSTERIOR LUMBAR INTERBODY FUSION (PLIF) 1 LEVEL L4-5  . SPINE SURGERY     Lamincetomy and Disectomy L5- S1  . TONSILLECTOMY AND ADENOIDECTOMY      OB History    No data available       Home Medications    Prior to Admission medications   Medication Sig Start Date End Date Taking? Authorizing Provider  amoxicillin-clavulanate (AUGMENTIN) 875-125 MG tablet Take 1 tablet by mouth every 12 (twelve) hours. 05/15/17   Vanessa Kick, MD  buPROPion [facility]  SR) 150 MG 12 hr tablet Take 1 tablet (150 mg total) by mouth daily. 03/29/17 03/29/18  Aundra Dubin, MD  clindamycin (CLEOCIN T) 1 % lotion Apply topically 2 (two) times daily. 05/01/17   Wardell Honour, MD  DULoxetine (CYMBALTA) 60 MG capsule Take 1 capsule (60 mg total) by mouth 2 (two) times daily. 03/30/17   Eksir, Richard Miu, MD  levothyroxine (SYNTHROID, LEVOTHROID) 50 MCG tablet Take 1 tablet (50 mcg total) by mouth daily before breakfast. 05/01/17   Wardell Honour, MD  tretinoin (RETIN-A) 0.05 % cream Apply topically at bedtime. 04/30/17   Wardell Honour, MD    Family History Family History  Problem Relation Age of Onset  . Cancer Mother 68       lung  cancer  . Hypertension Mother   . Cancer Sister        Breast cancer  . Heart disease Sister   . Asthma Sister   . Asthma Brother   . Drug abuse Brother   . Hypertension Brother     Social History Social History   Tobacco Use  . Smoking status: Never Smoker  . Smokeless tobacco: Never Used  Substance Use Topics  . Alcohol use: No    Alcohol/week: 0.0 oz  . Drug use: No     Allergies   Doxycycline; Sulfa antibiotics; Tape; and Methocarbamol   Review of Systems Review of Systems  Constitutional: Negative for chills, diaphoresis and fever.  Musculoskeletal: Negative for arthralgias and joint swelling.  Skin: Positive for wound.  Neurological: Negative for dizziness, weakness, numbness and headaches.  Psychiatric/Behavioral: Negative for confusion. The patient is not nervous/anxious.      Physical Exam Updated Vital Signs BP (!) 129/93 (BP Location: Left Arm)   Pulse 70   Temp 97.7 F (36.5 C) (Oral)   Resp 20   SpO2 100%   Physical Exam  Constitutional: She is oriented to person, place, and time. She appears well-developed and well-nourished. No distress.  HENT:  Head: Normocephalic and atraumatic.  Eyes: Conjunctivae are normal.  Cardiovascular: Normal rate.  Pulses:      Radial pulses are 2+ on the right side, and 2+ on the left side.  Pulmonary/Chest: Effort normal.  Musculoskeletal:  Full ROM and 5/5 equal strength in bilateral upper extremities. No evidence of tendon damage to hands.  Neurological: She is alert and oriented to person, place, and time.  Bilateral upper extremities are neurovascularly intact without diminished sensation or weakness  Skin: Skin is warm. Capillary refill takes less than 2 seconds. She is not diaphoretic.  Wound to left hand: erythematous and violaceous area with mild to moderate swelling proximal to index finger over 2nd MCP joint with small puncture wound. Bleeding well controlled. No induration, warmth, drainage, or  deformity.   Psychiatric: She has a normal mood and affect. Her behavior is normal.     ED Treatments / Results  Labs (all labs ordered are listed, but only abnormal results are displayed) Labs Reviewed - No data to display  EKG  EKG Interpretation None       Radiology No results found.  Procedures Procedures (including critical care time)  Medications Ordered in ED Medications - No data to display   Initial Impression / Assessment and Plan / ED Course  I have reviewed the triage vital signs and the nursing notes.  Pertinent labs & imaging results that were available during my care of the patient were reviewed by me and considered  in my medical decision making (see chart for details).   Wound s/p dog bite Wound is well healing and appears to be superficial and has no evidence of tendon damage. Bleeding is well controlled and swelling has improved significantly since the incident. Tetanus UTD. - Start Augmentin, also given Diflucan due to patient's history of yeast infection with concurrent antibiotic use. - Ice and elevate the hand, alternate tylenol and ibuprofen for pain relief, practice good wound hygiene. - Monitor for any fever/chills, changes in pain, altered sensation, weakness, or limited range of motion and return to clinic or ED if these symptoms present.  - She has a visit with her PCP already scheduled for tomorrow and she can continue to monitor there. - Patient is going to report the dog and obtain its immunization record.   Final Clinical Impressions(s) / ED Diagnoses   Final diagnoses:  Dog bite, initial encounter    New Prescriptions New Prescriptions   AMOXICILLIN-CLAVULANATE (AUGMENTIN) 875-125 MG TABLET    Take 1 tablet by mouth every 12 (twelve) hours.

## 2017-05-16 DIAGNOSIS — N3020 Other chronic cystitis without hematuria: Secondary | ICD-10-CM | POA: Diagnosis not present

## 2017-05-16 DIAGNOSIS — R32 Unspecified urinary incontinence: Secondary | ICD-10-CM | POA: Diagnosis not present

## 2017-05-21 ENCOUNTER — Ambulatory Visit: Payer: Medicare HMO

## 2017-05-22 NOTE — Progress Notes (Signed)
Subjective:    Patient ID: Emily Steele, female    DOB: 1955-10-01, 62 y.o.   MRN: [id number]  05/23/2017  Annual Exam    HPI This 62 y.o. female presents for Complete Physical Examination and follow-up of chronic medical conditions.  Last physical:07-10-2014 Pap smear:  07-10-2014; hysterectomy. Mammogram: 2013; REFUSES. Colonoscopy:  2009; DUE AGAIN.  Received letter; will schedule.  Bone density:  2009   Recurrent UTI:  S/p urology consultation.  Diagnosed with overactive bladder.  So pleased with explanation.   Urinary incontinence; wears a pad right now.   Hypothyroidism: ran out of Levothyroxine; stayed in [place] for six weeks.  NONE in TWO WEEKS.  Bipolar disorder: referred to psychiatry; mood is so much improved with Cymbalta; followed by psychiatry; Dr. Daron Offer.  Recommended therapy.  Prescribed Wellbutrin.  Rosacea: maintained on Clindamycin and Tretinoin.  DDD lumbar: limited drive back due to worsening lower back pain.  Smoothie; milk; cottage cheese.     No exam data present  BP Readings from Last 3 Encounters:  05/23/17 128/88  05/15/17 (!) 129/93  01/13/17 138/82   Wt Readings from Last 3 Encounters:  05/23/17 168 lb (76.2 kg)  01/13/17 173 lb (78.5 kg)  11/01/16 171 lb (77.6 kg)   Immunization History  Administered Date(s) Administered  . Influenza Split 02/29/2012  . Influenza,inj,Quad PF,6+ Mos 02/07/2013, 04/08/2014, 03/12/2015  . Influenza-Unspecified 03/17/2016, 03/17/2017  . Td 04/17/2010  . Zoster Recombinat (Shingrix) 10/22/2016, 12/18/2016   Health Maintenance  Topic Date Due  . COLONOSCOPY  04/17/2017  . MAMMOGRAM  12/15/2017 (Originally 04/17/2013)  . PAP SMEAR  07/09/2017  . TETANUS/TDAP  04/17/2020  . INFLUENZA VACCINE  Completed  . Hepatitis C Screening  Completed  . HIV Screening  Completed    Review of Systems  Constitutional: Positive for fatigue. Negative for activity change, appetite change, chills, diaphoresis,  fever and unexpected weight change.  HENT: Negative for congestion, dental problem, drooling, ear discharge, ear pain, facial swelling, hearing loss, mouth sores, nosebleeds, postnasal drip, rhinorrhea, sinus pressure, sneezing, sore throat, tinnitus, trouble swallowing and voice change.   Eyes: Negative for photophobia, pain, discharge, redness, itching and visual disturbance.  Respiratory: Negative for apnea, cough, choking, chest tightness, shortness of breath, wheezing and stridor.   Cardiovascular: Negative for chest pain, palpitations and leg swelling.  Gastrointestinal: Negative for abdominal distention, abdominal pain, anal bleeding, blood in stool, constipation, diarrhea, nausea, rectal pain and vomiting.  Endocrine: Negative for cold intolerance, heat intolerance, polydipsia, polyphagia and polyuria.  Genitourinary: Positive for urgency. Negative for decreased urine volume, difficulty urinating, dyspareunia, dysuria, enuresis, flank pain, frequency, genital sores, hematuria, menstrual problem, pelvic pain, vaginal bleeding, vaginal discharge and vaginal pain.       Nocturia x 0.  Urinary incontinence mild; wears a pad.  Musculoskeletal: Positive for arthralgias, back pain and joint swelling. Negative for gait problem, myalgias, neck pain and neck stiffness.  Skin: Negative for color change, pallor, rash and wound.  Allergic/Immunologic: Negative for environmental allergies, food allergies and immunocompromised state.  Neurological: Negative for dizziness, tremors, seizures, syncope, facial asymmetry, speech difficulty, weakness, light-headedness, numbness and headaches.  Hematological: Negative for adenopathy. Does not bruise/bleed easily.  Psychiatric/Behavioral: Positive for dysphoric mood and sleep disturbance. Negative for agitation, behavioral problems, confusion, decreased concentration, hallucinations, self-injury and suicidal ideas. The patient is not nervous/anxious and is not  hyperactive.        Bedtime 1000; falls asleep later.  Wakes up 700.    Past Medical  History:  Diagnosis Date  . Allergy   . Anemia   . Anxiety   . Arthritis   . Brain tumor ([place])    right temporal, not ca  . Constipation   . DDD (degenerative disc disease)   . Depression   . Eczema   . Fibromyalgia   . Fibromyalgia   . GERD (gastroesophageal reflux disease)   . Hearing loss    1 ear - mild  . Herpes simplex   . Hx of migraine headaches   . Hypertension    not on medications  . Hypothyroid   . Obesity   . Osteoarthritis   . Osteopenia   . Osteoporosis   . Overactive bladder   . Plantar fasciitis   . Pneumonia 08/2012  . Rosacea   . Sleep behavior disorder, REM   . Toenail fungus   . UTI (urinary tract infection)    Past Surgical History:  Procedure Laterality Date  . ABDOMINAL HYSTERECTOMY     uterine fibroid; ovaries intact. DUB.  [facility] HAND SURGERY Bilateral    ligimant transfere to thumbs  . LAMINECTOMY  08/18/2013   L 4 L5    DR CRAM  . MAXIMUM ACCESS (MAS)POSTERIOR LUMBAR INTERBODY FUSION (PLIF) 1 LEVEL N/A 08/18/2013   Procedure: FOR MAXIMUM ACCESS (MAS) POSTERIOR LUMBAR INTERBODY FUSION (PLIF) 1 LEVEL;  Surgeon: Elaina Hoops, MD;  Location: [facility];  Service: Neurosurgery;  Laterality: N/A;  FOR MAXIMUM ACCESS (MAS) POSTERIOR LUMBAR INTERBODY FUSION (PLIF) 1 LEVEL L4-5  . SPINE SURGERY     Lamincetomy and Disectomy L5- S1  . TONSILLECTOMY AND ADENOIDECTOMY     Allergies  Allergen Reactions  . Doxycycline Other (See Comments)    headaches  . Sulfa Antibiotics     Headaches  . Tape Itching  . Methocarbamol Nausea Only    headaches   Current Outpatient Medications on File Prior to Visit  Medication Sig Dispense Refill  . buPROPion (WELLBUTRIN SR) 150 MG 12 hr tablet Take 1 tablet (150 mg total) by mouth daily. 90 tablet 1  . clindamycin (CLEOCIN T) 1 % lotion Apply topically 2 (two) times daily. 60 mL 0  . DULoxetine (CYMBALTA) 60 MG capsule Take 1  capsule (60 mg total) by mouth 2 (two) times daily. 180 capsule 1  . fluconazole (DIFLUCAN) 150 MG tablet Take one tablet by mouth as a single dose. 1 tablet 0  . levothyroxine (SYNTHROID, LEVOTHROID) 50 MCG tablet Take 1 tablet (50 mcg total) by mouth daily before breakfast. 90 tablet 1  . tretinoin (RETIN-A) 0.05 % cream Apply topically at bedtime. 45 g 1   No current facility-administered medications on file prior to visit.    Social History   Socioeconomic History  . Marital status: Single    Spouse name: Not on file  . Number of children: 0  . Years of education: college  . Highest education level: Not on file  Social Needs  . Financial resource strain: Not on file  . Food insecurity - worry: Not on file  . Food insecurity - inability: Not on file  . Transportation needs - medical: Not on file  . Transportation needs - non-medical: Not on file  Occupational History    Employer: OTHER    Comment: n/a  Tobacco Use  . Smoking status: Never Smoker  . Smokeless tobacco: Never Used  Substance and Sexual Activity  . Alcohol use: No    Alcohol/week: 0.0 oz  . Drug use: No  .  Sexual activity: No    Birth control/protection: Post-menopausal, Surgical  Other Topics Concern  . Not on file  Social History Narrative   Marital status: single; not dating.        Children: none      Lives: Patient lives at home alone.  Originally from [place]; moved to [place] in 2000.      Employment: disability in 2011 due to DDD lumbar.  Education officer, [employer].     Tobacco: none      Alcohol: none      Exercise:       ADLs: independent with ADLs in 2019; drives.      Advanced Directives: FULL CODE no prolonged measures.           Caffeine Use: 3-4 cups weekly   Single. Education: [employer].  Exercises 4x's a week.   Family History  Problem Relation Age of Onset  . Cancer Mother 68       lung cancer  . Hypertension Mother   . Cancer Sister        Breast cancer  . Heart disease Sister     . Asthma Sister   . Asthma Brother   . Drug abuse Brother   . Hypertension Brother        Objective:    BP 128/88   Pulse 90   Temp 98 F (36.7 C) (Oral)   Resp 16   Ht 4' 10.66" (1.49 m)   Wt 168 lb (76.2 kg)   SpO2 97%   BMI 34.33 kg/m  Physical Exam  Constitutional: She is oriented to person, place, and time. She appears well-developed and well-nourished. No distress.  HENT:  Head: Normocephalic and atraumatic.  Right Ear: Hearing, tympanic membrane, external ear and ear canal normal.  Left Ear: Hearing, tympanic membrane, external ear and ear canal normal.  Nose: Nose normal.  Mouth/Throat: Oropharynx is clear and moist.  Eyes: Conjunctivae and EOM are normal. Pupils are equal, round, and reactive to light.  Neck: Normal range of motion and full passive range of motion without pain. Neck supple. No JVD present. Carotid bruit is not present. No thyromegaly present.  Cardiovascular: Normal rate, regular rhythm, normal heart sounds and intact distal pulses. Exam reveals no gallop and no friction rub.  No murmur heard. Pulmonary/Chest: Effort normal and breath sounds normal. No respiratory distress. She has no wheezes. She has no rales. Right breast exhibits no inverted nipple, no mass, no nipple discharge, no skin change and no tenderness. Left breast exhibits no inverted nipple, no mass, no nipple discharge, no skin change and no tenderness. Breasts are symmetrical.  Abdominal: Soft. Bowel sounds are normal. She exhibits no distension and no mass. There is no tenderness. There is no rebound and no guarding.  Musculoskeletal:       Right shoulder: Normal.       Left shoulder: Normal.       Cervical back: Normal.  Lymphadenopathy:    She has no cervical adenopathy.  Neurological: She is alert and oriented to person, place, and time. She has normal reflexes. No cranial nerve deficit. She exhibits normal muscle tone. Coordination normal.  Skin: Skin is warm and dry. No rash  noted. She is not diaphoretic. No erythema. No pallor.  Psychiatric: She has a normal mood and affect. Her behavior is normal. Judgment and thought content normal.  Nursing note and vitals reviewed.  No results found. Depression screen [facility] 2/9 05/23/2017 01/29/2017 01/13/2017 11/01/2016 10/12/2016  Decreased  Interest 0 0 0 0 3  Down, Depressed, Hopeless 0 0 0 0 3  PHQ - 2 Score 0 0 0 0 6  Altered sleeping - - - - 2  Tired, decreased energy - - - - 2  Change in appetite - - - - 2  Feeling bad or failure about yourself  - - - - 2  Trouble concentrating - - - - 0  Moving slowly or fidgety/restless - - - - 0  Suicidal thoughts - - - - 0  PHQ-9 Score - - - - 14   Fall Risk  05/23/2017 01/29/2017 01/13/2017 11/01/2016 10/12/2016  Falls in the past year? No No No No No  Number falls in past yr: - - - - -        Assessment & Plan:   1. Routine physical examination   2. Hypothyroidism due to acquired atrophy of thyroid   3. Degenerative disc disease, lumbar   4. Osteopenia of both hips   5. Spondylolisthesis at L4-L5 level   6. Rosacea, acne   7. Other iron deficiency anemia   8. Cognitive impairment   9. Fibromyalgia   10. History of encephalopathy   11. Recurrent major depressive disorder, in partial remission ([place])   12. Screening, lipid   13. Screening for diabetes mellitus   14. Vitamin D deficiency     -anticipatory guidance provided --- exercise, weight loss, safe driving practices, aspirin 81mg  daily. -obtain age appropriate screening labs and labs for chronic disease management. -moderate fall risk; undergoing treatment for bipolar disorder by psychiatry; no evidence of hearing loss.  Discussed advanced directives and living will; also discussed end of life issues including code status.   Degenerative disc disease chronic: Interferes with ability to ambulate.  Maintained on disability. Osteopenia: Recommend calcium 600 mg twice daily or 3 servings of dairy daily.  Recommend 800  international units of vitamin D daily.  Recommend weightbearing exercise for 45 minutes daily. Acne rosacea: Maintained on topical clindamycin therapy and tretinoin therapy. Iron deficiency anemia: Repeat CBC today.  Continue iron rich foods and iron supplementation as indicated. History of encephalopathy: With cognitive impairment.  Independent with ADLs at this time.  Doing well. Fibromyalgia: Chronic.  Continue supportive care with regular exercise, regular stretching, medication for symptomatic relief. Vitamin D deficiency: Repeat labs today.  Continue vitamin D supplementation. PATIENT REFUSES MAMMOGRAPHY.  Orders Placed This Encounter  Procedures  . CBC with Differential/Platelet  . Comprehensive metabolic panel    Order Specific Question:   Has the patient fasted?    Answer:   No  . Lipid panel    Order Specific Question:   Has the patient fasted?    Answer:   No  . TSH  . T4, free  . VITAMIN D 25 Hydroxy (Vit-D Deficiency, Fractures)  . POCT urinalysis dipstick   No orders of the defined types were placed in this encounter.   Return in about 1 year (around 05/23/2018) for complete physical examiniation.   Jaja Switalski Elayne Guerin, M.D. Primary Care at [facility] previously [facility] [address] [place], [place]  [address] [phone] phone [phone] fax

## 2017-05-23 ENCOUNTER — Encounter: Payer: Self-pay | Admitting: Family Medicine

## 2017-05-23 ENCOUNTER — Ambulatory Visit (INDEPENDENT_AMBULATORY_CARE_PROVIDER_SITE_OTHER): Payer: Medicare HMO | Admitting: Family Medicine

## 2017-05-23 ENCOUNTER — Other Ambulatory Visit: Payer: Self-pay

## 2017-05-23 VITALS — BP 128/88 | HR 90 | Temp 98.0°F | Resp 16 | Ht 58.66 in | Wt 168.0 lb

## 2017-05-23 DIAGNOSIS — M797 Fibromyalgia: Secondary | ICD-10-CM

## 2017-05-23 DIAGNOSIS — R69 Illness, unspecified: Secondary | ICD-10-CM | POA: Diagnosis not present

## 2017-05-23 DIAGNOSIS — M4316 Spondylolisthesis, lumbar region: Secondary | ICD-10-CM | POA: Diagnosis not present

## 2017-05-23 DIAGNOSIS — Z0000 Encounter for general adult medical examination without abnormal findings: Secondary | ICD-10-CM

## 2017-05-23 DIAGNOSIS — E559 Vitamin D deficiency, unspecified: Secondary | ICD-10-CM | POA: Diagnosis not present

## 2017-05-23 DIAGNOSIS — F3341 Major depressive disorder, recurrent, in partial remission: Secondary | ICD-10-CM

## 2017-05-23 DIAGNOSIS — Z8669 Personal history of other diseases of the nervous system and sense organs: Secondary | ICD-10-CM

## 2017-05-23 DIAGNOSIS — L719 Rosacea, unspecified: Secondary | ICD-10-CM

## 2017-05-23 DIAGNOSIS — M85852 Other specified disorders of bone density and structure, left thigh: Secondary | ICD-10-CM | POA: Diagnosis not present

## 2017-05-23 DIAGNOSIS — R4189 Other symptoms and signs involving cognitive functions and awareness: Secondary | ICD-10-CM

## 2017-05-23 DIAGNOSIS — M5136 Other intervertebral disc degeneration, lumbar region: Secondary | ICD-10-CM | POA: Diagnosis not present

## 2017-05-23 DIAGNOSIS — Z131 Encounter for screening for diabetes mellitus: Secondary | ICD-10-CM | POA: Diagnosis not present

## 2017-05-23 DIAGNOSIS — D508 Other iron deficiency anemias: Secondary | ICD-10-CM

## 2017-05-23 DIAGNOSIS — Z13220 Encounter for screening for lipoid disorders: Secondary | ICD-10-CM

## 2017-05-23 DIAGNOSIS — M85851 Other specified disorders of bone density and structure, right thigh: Secondary | ICD-10-CM | POA: Diagnosis not present

## 2017-05-23 DIAGNOSIS — M51369 Other intervertebral disc degeneration, lumbar region without mention of lumbar back pain or lower extremity pain: Secondary | ICD-10-CM

## 2017-05-23 DIAGNOSIS — E034 Atrophy of thyroid (acquired): Secondary | ICD-10-CM | POA: Diagnosis not present

## 2017-05-23 LAB — POCT URINALYSIS DIP (MANUAL ENTRY)
BILIRUBIN UA: NEGATIVE
Blood, UA: NEGATIVE
Glucose, UA: NEGATIVE mg/dL
NITRITE UA: NEGATIVE
PH UA: 7 (ref 5.0–8.0)
Protein Ur, POC: 30 mg/dL — AB
Spec Grav, UA: 1.02 (ref 1.010–1.025)
Urobilinogen, UA: 0.2 E.U./dL

## 2017-05-23 NOTE — Patient Instructions (Addendum)
IF you received an x-ray today, you will receive an invoice from [facility]. Please contact [facility] at [phone] with questions or concerns regarding your invoice.   IF you received labwork today, you will receive an invoice from [facility]. Please contact [facility] at [phone] with questions or concerns regarding your invoice.   Our billing staff will not be able to assist you with questions regarding bills from these companies.  You will be contacted with the lab results as soon as they are available. The fastest way to get your results is to activate your My Chart account. Instructions are located on the last page of this paperwork. If you have not heard from [place] regarding the results in 2 weeks, please contact this office.      Preventive Care 40-64 Years, Female Preventive care refers to lifestyle choices and visits with your health care provider that can promote health and wellness. What does preventive care include?  A yearly physical exam. This is also called an annual well check.  Dental exams once or twice a year.  Routine eye exams. Ask your health care provider how often you should have your eyes checked.  Personal lifestyle choices, including: ? Daily care of your teeth and gums. ? Regular physical activity. ? Eating a healthy diet. ? Avoiding tobacco and drug use. ? Limiting alcohol use. ? Practicing safe sex. ? Taking low-dose aspirin daily starting at age 34. ? Taking vitamin and mineral supplements as recommended by your health care provider. What happens during an annual well check? The services and screenings done by your health care provider during your annual well check will depend on your age, overall health, lifestyle risk factors, and family history of disease. Counseling Your health care provider may ask you questions about your:  Alcohol use.  Tobacco use.  Drug use.  Emotional well-being.  Home and relationship  well-being.  Sexual activity.  Eating habits.  Work and work Statistician.  Method of birth control.  Menstrual cycle.  Pregnancy history.  Screening You may have the following tests or measurements:  Height, weight, and BMI.  Blood pressure.  Lipid and cholesterol levels. These may be checked every 5 years, or more frequently if you are over 65 years old.  Skin check.  Lung cancer screening. You may have this screening every year starting at age 102 if you have a 30-pack-year history of smoking and currently smoke or have quit within the past 15 years.  Fecal occult blood test (FOBT) of the stool. You may have this test every year starting at age 33.  Flexible sigmoidoscopy or colonoscopy. You may have a sigmoidoscopy every 5 years or a colonoscopy every 10 years starting at age 23.  Hepatitis C blood test.  Hepatitis B blood test.  Sexually transmitted disease (STD) testing.  Diabetes screening. This is done by checking your blood sugar (glucose) after you have not eaten for a while (fasting). You may have this done every 1-3 years.  Mammogram. This may be done every 1-2 years. Talk to your health care provider about when you should start having regular mammograms. This may depend on whether you have a family history of breast cancer.  BRCA-related cancer screening. This may be done if you have a family history of breast, ovarian, tubal, or peritoneal cancers.  Pelvic exam and Pap test. This may be done every 3 years starting at age 3. Starting at age 55, this may be done every 5 years if  you have a Pap test in combination with an HPV test.  Bone density scan. This is done to screen for osteoporosis. You may have this scan if you are at high risk for osteoporosis.  Discuss your test results, treatment options, and if necessary, the need for more tests with your health care provider. Vaccines Your health care provider may recommend certain vaccines, such  as:  Influenza vaccine. This is recommended every year.  Tetanus, diphtheria, and acellular pertussis (Tdap, Td) vaccine. You may need a Td booster every 10 years.  Varicella vaccine. You may need this if you have not been vaccinated.  Zoster vaccine. You may need this after age 77.  Measles, mumps, and rubella (MMR) vaccine. You may need at least one dose of MMR if you were born in 1957 or later. You may also need a second dose.  Pneumococcal 13-valent conjugate (PCV13) vaccine. You may need this if you have certain conditions and were not previously vaccinated.  Pneumococcal polysaccharide (PPSV23) vaccine. You may need one or two doses if you smoke cigarettes or if you have certain conditions.  Meningococcal vaccine. You may need this if you have certain conditions.  Hepatitis A vaccine. You may need this if you have certain conditions or if you travel or work in places where you may be exposed to hepatitis A.  Hepatitis B vaccine. You may need this if you have certain conditions or if you travel or work in places where you may be exposed to hepatitis B.  Haemophilus influenzae type b (Hib) vaccine. You may need this if you have certain conditions.  Talk to your health care provider about which screenings and vaccines you need and how often you need them. This information is not intended to replace advice given to you by your health care provider. Make sure you discuss any questions you have with your health care provider. Document Released: 04/30/2015 Document Revised: 12/22/2015 Document Reviewed: 02/02/2015 Elsevier Interactive Patient Education  [employer].

## 2017-05-24 LAB — COMPREHENSIVE METABOLIC PANEL
ALBUMIN: 4.5 g/dL (ref 3.6–4.8)
ALK PHOS: 125 IU/L — AB (ref 39–117)
ALT: 25 IU/L (ref 0–32)
AST: 21 IU/L (ref 0–40)
Albumin/Globulin Ratio: 2.3 — ABNORMAL HIGH (ref 1.2–2.2)
BILIRUBIN TOTAL: 0.4 mg/dL (ref 0.0–1.2)
BUN/Creatinine Ratio: 22 (ref 12–28)
BUN: 14 mg/dL (ref 8–27)
CHLORIDE: 102 mmol/L (ref 96–106)
CO2: 21 mmol/L (ref 20–29)
Calcium: 9.4 mg/dL (ref 8.7–10.3)
Creatinine, Ser: 0.64 mg/dL (ref 0.57–1.00)
GFR calc Af Amer: 112 mL/min/{1.73_m2} (ref >59)
GFR calc non Af Amer: 97 mL/min/{1.73_m2} (ref >59)
GLOBULIN, TOTAL: 2 g/dL (ref 1.5–4.5)
GLUCOSE: 85 mg/dL (ref 65–99)
Potassium: 4.7 mmol/L (ref 3.5–5.2)
SODIUM: 140 mmol/L (ref 134–144)
Total Protein: 6.5 g/dL (ref 6.0–8.5)

## 2017-05-24 LAB — CBC WITH DIFFERENTIAL/PLATELET
Basophils Absolute: 0 10*3/uL (ref 0.0–0.2)
Basos: 1 %
EOS (ABSOLUTE): 0.2 10*3/uL (ref 0.0–0.4)
Eos: 4 %
HEMOGLOBIN: 15.4 g/dL (ref 11.1–15.9)
Hematocrit: 46.8 % — ABNORMAL HIGH (ref 34.0–46.6)
Immature Grans (Abs): 0 10*3/uL (ref 0.0–0.1)
Immature Granulocytes: 0 %
LYMPHS ABS: 1.3 10*3/uL (ref 0.7–3.1)
Lymphs: 33 %
MCH: 28.5 pg (ref 26.6–33.0)
MCHC: 32.9 g/dL (ref 31.5–35.7)
MCV: 87 fL (ref 79–97)
MONOCYTES: 8 %
MONOS ABS: 0.3 10*3/uL (ref 0.1–0.9)
NEUTROS ABS: 2.1 10*3/uL (ref 1.4–7.0)
Neutrophils: 54 %
Platelets: 209 10*3/uL (ref 150–379)
RBC: 5.41 x10E6/uL — ABNORMAL HIGH (ref 3.77–5.28)
RDW: 14.3 % (ref 12.3–15.4)
WBC: 3.9 10*3/uL (ref 3.4–10.8)

## 2017-05-24 LAB — VITAMIN D 25 HYDROXY (VIT D DEFICIENCY, FRACTURES): VIT D 25 HYDROXY: 20.2 ng/mL — AB (ref 30.0–100.0)

## 2017-05-24 LAB — LIPID PANEL
CHOLESTEROL TOTAL: 233 mg/dL — AB (ref 100–199)
Chol/HDL Ratio: 3.8 ratio (ref 0.0–4.4)
HDL: 61 mg/dL (ref >39)
LDL Calculated: 151 mg/dL — ABNORMAL HIGH (ref 0–99)
Triglycerides: 104 mg/dL (ref 0–149)
VLDL Cholesterol Cal: 21 mg/dL (ref 5–40)

## 2017-05-24 LAB — TSH: TSH: 4.7 u[IU]/mL — ABNORMAL HIGH (ref 0.450–4.500)

## 2017-05-24 LAB — T4, FREE: FREE T4: 1.16 ng/dL (ref 0.82–1.77)

## 2017-06-12 ENCOUNTER — Telehealth (HOSPITAL_COMMUNITY): Payer: Self-pay

## 2017-06-12 NOTE — Telephone Encounter (Signed)
Patient is calling, she said she has not been able to take the Temazepam because it is not brand and she can not take the generic. Patient has not been sleeping and due to that her anxiety has increased. Please review and advise, thank you

## 2017-06-12 NOTE — Telephone Encounter (Signed)
Im confused - she has been receiving the brand name for the past nearly 2 months, and suddenly its not working? Or is there an issue with it being covered?

## 2017-06-14 NOTE — Telephone Encounter (Signed)
So she should try it then, and let us know how it goes

## 2017-06-14 NOTE — Telephone Encounter (Signed)
I called and she said that the day she picked up the prescription she was leaving for [place] and did not notice it was generic until she got there. She never took them at all.

## 2017-06-27 ENCOUNTER — Ambulatory Visit (HOSPITAL_COMMUNITY): Payer: Medicare HMO | Admitting: Psychiatry

## 2017-06-27 ENCOUNTER — Encounter (HOSPITAL_COMMUNITY): Payer: Self-pay | Admitting: Psychiatry

## 2017-06-27 VITALS — BP 120/78 | HR 85 | Ht <= 58 in | Wt 166.0 lb

## 2017-06-27 DIAGNOSIS — G894 Chronic pain syndrome: Secondary | ICD-10-CM

## 2017-06-27 DIAGNOSIS — F329 Major depressive disorder, single episode, unspecified: Secondary | ICD-10-CM | POA: Diagnosis not present

## 2017-06-27 DIAGNOSIS — F5104 Psychophysiologic insomnia: Secondary | ICD-10-CM | POA: Diagnosis not present

## 2017-06-27 DIAGNOSIS — F419 Anxiety disorder, unspecified: Secondary | ICD-10-CM

## 2017-06-27 DIAGNOSIS — F32A Depression, unspecified: Secondary | ICD-10-CM

## 2017-06-27 DIAGNOSIS — Z813 Family history of other psychoactive substance abuse and dependence: Secondary | ICD-10-CM | POA: Diagnosis not present

## 2017-06-27 DIAGNOSIS — R69 Illness, unspecified: Secondary | ICD-10-CM | POA: Diagnosis not present

## 2017-06-27 MED ORDER — BUPROPION HCL ER (SR) 150 MG PO TB12: 150.0000 mg | ORAL_TABLET | Freq: Two times a day (BID) | ORAL | 1 refills | Status: AC

## 2017-06-27 MED ORDER — DULOXETINE HCL 60 MG PO CPEP: 60.0000 mg | ORAL_CAPSULE | Freq: Two times a day (BID) | ORAL | 1 refills | Status: AC

## 2017-06-27 MED ORDER — TEMAZEPAM 15 MG PO CAPS: 15.0000 mg | ORAL_CAPSULE | Freq: Every evening | ORAL | 3 refills | Status: DC | PRN

## 2017-06-27 NOTE — Progress Notes (Signed)
BH MD/PA/NP OP Progress Note  06/27/2017 11:17 AM Emily Steele  MRN:  [id number]  Chief Complaint: med management HPI: Emily Steele reports that her anxiety and depression have been fairly stable, but her sleep has not been addressed yet.  She reports that she had requested brand name Restoril, because generic Restoril gives her nightmares.  We agreed to resend of the Restoril for brand name request, but it is unclear if insurance will cover this.  With regard to her Cymbalta and Wellbutrin, she continues Cymbalta 120 mg daily and Wellbutrin SR 150 mg twice a day.  No acute suicidality or unsafe thoughts.  She had a nice trip to [place] to visit with family.  Visit Diagnosis:    ICD-10-CM   1. Depression, unspecified depression type F32.9 temazepam (RESTORIL) 15 MG capsule    DULoxetine (CYMBALTA) 60 MG capsule    buPROPion (WELLBUTRIN SR) 150 MG 12 hr tablet  2. Psychophysiological insomnia F51.04 temazepam (RESTORIL) 15 MG capsule  3. Chronic pain syndrome G89.4 temazepam (RESTORIL) 15 MG capsule    Past Psychiatric History: See intake H&P for full details. Reviewed, with no updates at this time.\   Past Medical History:  Past Medical History:  Diagnosis Date  . Allergy   . Anemia   . Anxiety   . Arthritis   . Brain tumor ([place])    right temporal, not ca  . Constipation   . DDD (degenerative disc disease)   . Depression   . Eczema   . Fibromyalgia   . Fibromyalgia   . GERD (gastroesophageal reflux disease)   . Hearing loss    1 ear - mild  . Herpes simplex   . Hx of migraine headaches   . Hypertension    not on medications  . Hypothyroid   . Obesity   . Osteoarthritis   . Osteopenia   . Osteoporosis   . Overactive bladder   . Plantar fasciitis   . Pneumonia 08/2012  . Rosacea   . Sleep behavior disorder, REM   . Toenail fungus   . UTI (urinary tract infection)     Past Surgical History:  Procedure Laterality Date  . ABDOMINAL HYSTERECTOMY     uterine fibroid; ovaries intact. DUB.  Marland Kitchen HAND SURGERY Bilateral    ligimant transfere to thumbs  . LAMINECTOMY  08/18/2013   L 4 L5    DR CRAM  . MAXIMUM ACCESS (MAS)POSTERIOR LUMBAR INTERBODY FUSION (PLIF) 1 LEVEL N/A 08/18/2013   Procedure: FOR MAXIMUM ACCESS (MAS) POSTERIOR LUMBAR INTERBODY FUSION (PLIF) 1 LEVEL;  Surgeon: Elaina Hoops, MD;  Location: [facility];  Service: Neurosurgery;  Laterality: N/A;  FOR MAXIMUM ACCESS (MAS) POSTERIOR LUMBAR INTERBODY FUSION (PLIF) 1 LEVEL L4-5  . SPINE SURGERY     Lamincetomy and Disectomy L5- S1  . TONSILLECTOMY AND ADENOIDECTOMY      Family Psychiatric History: See intake H&P for full details. Reviewed, with no updates at this time.   Family History:  Family History  Problem Relation Age of Onset  . Cancer Mother 74       lung cancer  . Hypertension Mother   . Cancer Sister        Breast cancer  . Heart disease Sister   . Asthma Sister   . Asthma Brother   . Drug abuse Brother   . Hypertension Brother     Social History:  Social History   Socioeconomic History  . Marital status: Single  Spouse name: None  . Number of children: 0  . Years of education: college  . Highest education level: None  Social Needs  . Financial resource strain: None  . Food insecurity - worry: None  . Food insecurity - inability: None  . Transportation needs - medical: None  . Transportation needs - non-medical: None  Occupational History    Employer: OTHER    Comment: n/a  Tobacco Use  . Smoking status: Never Smoker  . Smokeless tobacco: Never Used  Substance and Sexual Activity  . Alcohol use: No    Alcohol/week: 0.0 oz  . Drug use: No  . Sexual activity: No    Birth control/protection: Post-menopausal, Surgical  Other Topics Concern  . None  Social History Narrative   Marital status: single; not dating.        Children: none      Lives: Patient lives at home alone.  Originally from [place]; moved to [place] in 2000.      Employment:  disability in 2011 due to DDD lumbar.  Education officer, [employer].     Tobacco: none      Alcohol: none      Exercise:       ADLs: independent with ADLs in 2019; drives.      Advanced Directives: FULL CODE no prolonged measures.           Caffeine Use: 3-4 cups weekly   Single. Education: [employer].  Exercises 4x's a week.    Allergies:  Allergies  Allergen Reactions  . Doxycycline Other (See Comments)    headaches  . Sulfa Antibiotics     Headaches  . Tape Itching  . Temazepam Other (See Comments)    Experiences bad dreams with generic form  . Methocarbamol Nausea Only    headaches    Metabolic Disorder Labs: Lab Results  Component Value Date   HGBA1C 5.4 07/10/2014   No results found for: PROLACTIN Lab Results  Component Value Date   CHOL 233 (H) 05/23/2017   TRIG 104 05/23/2017   HDL 61 05/23/2017   CHOLHDL 3.8 05/23/2017   LDLCALC 151 (H) 05/23/2017   Lab Results  Component Value Date   TSH 4.700 (H) 05/23/2017   TSH 2.210 01/13/2017    Therapeutic Level Labs: No results found for: LITHIUM No results found for: VALPROATE No components found for:  CBMZ  Current Medications: Current Outpatient Medications  Medication Sig Dispense Refill  . buPROPion (WELLBUTRIN SR) 150 MG 12 hr tablet Take 1 tablet (150 mg total) by mouth 2 (two) times daily. 180 tablet 1  . clindamycin (CLEOCIN T) 1 % lotion Apply topically 2 (two) times daily. 60 mL 0  . DULoxetine (CYMBALTA) 60 MG capsule Take 1 capsule (60 mg total) by mouth 2 (two) times daily. 180 capsule 1  . levothyroxine (SYNTHROID, LEVOTHROID) 50 MCG tablet Take 1 tablet (50 mcg total) by mouth daily before breakfast. 90 tablet 1  . Omega-3 Fatty Acids (SEA-OMEGA 70 PO) Take by mouth daily.    Marland Kitchen tretinoin (RETIN-A) 0.05 % cream Apply topically at bedtime. 45 g 1  . temazepam (RESTORIL) 15 MG capsule Take 1 capsule (15 mg total) by mouth at bedtime as needed for sleep. 30 capsule 3   No current facility-administered  medications for this visit.      Musculoskeletal: Strength & Muscle Tone: within normal limits Gait & Station: normal Patient leans: N/A  Psychiatric Specialty Exam: ROS  Blood pressure 120/78, pulse 85, height 4'  10" (1.473 m), weight 166 lb (75.3 kg), SpO2 95 %.Body mass index is 34.69 kg/m.  General Appearance: Casual and Fairly Groomed  Eye Contact:  Fair  Speech:  Clear and Coherent and Normal Rate  Volume:  Normal  Mood:  Euthymic  Affect:  Appropriate and Congruent  Thought Process:  Coherent, Goal Directed and Descriptions of Associations: Intact  Orientation:  Full (Time, Place, and Person)  Thought Content: Logical   Suicidal Thoughts:  No  Homicidal Thoughts:  No  Memory:  Immediate;   Fair  Judgement:  Fair  Insight:  Fair  Psychomotor Activity:  Normal  Concentration:  Attention Span: Good  Recall:  Good  Fund of Knowledge: Good  Language: Good  Akathisia:  Negative  Handed:  Right  AIMS (if indicated): not done  Assets:  Communication Skills Desire for Improvement Financial Resources/Insurance Housing  ADL's:  Intact  Cognition: WNL  Sleep:  Poor   Screenings: PHQ2-9     Office Visit from 05/23/2017 in [facility] from 01/29/2017 in [facility] from 01/13/2017 in [facility] from 11/01/2016 in [facility] at [facility] from 10/12/2016 in [facility] Total Score  0  0  0  0  6  PHQ-9 Total Score  No data  No data  No data  No data  14       Assessment and Plan:  Emily Steele presents with interval improvement in her depression with the addition of Wellbutrin.  She continues to struggle with sleep onset insomnia and difficulty maintaining sleep.  She has a negative response to generic Restoril so we agreed to resent the prescription for brand name only.  No acute safety issues and we will follow-up in 3 months.  1. Depression, unspecified depression type    2. Psychophysiological insomnia   3. Chronic pain syndrome     Status of current problems: gradually improving  Labs Ordered: No orders of the defined types were placed in this encounter.   Labs Reviewed: N/A  Collateral Obtained/Records Reviewed: N/A  Plan:  Wellbutrin, Cymbalta as prescribed Restoril brand name only  I spent 20 minutes with the patient in direct face-to-face clinical care.  Greater than 50% of this time was spent in counseling and coordination of care with the patient.    Aundra Dubin, MD 06/27/2017, 11:17 AM

## 2017-07-10 ENCOUNTER — Ambulatory Visit: Payer: Medicare HMO

## 2017-07-10 ENCOUNTER — Ambulatory Visit (INDEPENDENT_AMBULATORY_CARE_PROVIDER_SITE_OTHER): Payer: Medicare HMO

## 2017-07-10 ENCOUNTER — Telehealth: Payer: Self-pay

## 2017-07-10 VITALS — BP 100/74 | HR 95 | Ht <= 58 in | Wt 162.2 lb

## 2017-07-10 DIAGNOSIS — Z0000 Encounter for general adult medical examination without abnormal findings: Secondary | ICD-10-CM | POA: Diagnosis not present

## 2017-07-10 DIAGNOSIS — E2839 Other primary ovarian failure: Secondary | ICD-10-CM

## 2017-07-10 NOTE — Patient Instructions (Addendum)
Emily Steele , Thank you for taking time to come for your Medicare Wellness Visit. I appreciate your ongoing commitment to your health goals. Please review the following plan we discussed and let me know if I can assist you in the future.   Screening recommendations/referrals: Colonoscopy: declined, You state you will set this up on your own.  Mammogram: declined Bone Density: You state you want to have a dexa scan. Will send message requesting this from your PCP since you are under the age of 61. Provider will contact you about her recommendations. Recommended yearly ophthalmology/optometry visit for glaucoma screening and checkup Recommended yearly dental visit for hygiene and checkup  Vaccinations: Influenza vaccine: up to date  Pneumococcal vaccine: Prevnar 62 at age 61  Tdap vaccine: up to date, next due 05/25/2020 Shingles vaccine: up to date     Advanced directives: copy in chart   Conditions/risks identified:  Try to start exercising more and join Silver sneakers.   Next appointment: schedule follow up visit with PCP, next AWV in 1 year  Preventive Care 40-64 Years, Female Preventive care refers to lifestyle choices and visits with your health care provider that can promote health and wellness. What does preventive care include?  A yearly physical exam. This is also called an annual well check.  Dental exams once or twice a year.  Routine eye exams. Ask your health care provider how often you should have your eyes checked.  Personal lifestyle choices, including:  Daily care of your teeth and gums.  Regular physical activity.  Eating a healthy diet.  Avoiding tobacco and drug use.  Limiting alcohol use.  Practicing safe sex.  Taking low-dose aspirin daily starting at age 73.  Taking vitamin and mineral supplements as recommended by your health care provider. What happens during an annual well check? The services and screenings done by your health care provider  during your annual well check will depend on your age, overall health, lifestyle risk factors, and family history of disease. Counseling  Your health care provider may ask you questions about your:  Alcohol use.  Tobacco use.  Drug use.  Emotional well-being.  Home and relationship well-being.  Sexual activity.  Eating habits.  Work and work Statistician.  Method of birth control.  Menstrual cycle.  Pregnancy history. Screening  You may have the following tests or measurements:  Height, weight, and BMI.  Blood pressure.  Lipid and cholesterol levels. These may be checked every 5 years, or more frequently if you are over 47 years old.  Skin check.  Lung cancer screening. You may have this screening every year starting at age 104 if you have a 30-pack-year history of smoking and currently smoke or have quit within the past 15 years.  Fecal occult blood test (FOBT) of the stool. You may have this test every year starting at age 63.  Flexible sigmoidoscopy or colonoscopy. You may have a sigmoidoscopy every 5 years or a colonoscopy every 10 years starting at age 40.  Hepatitis C blood test.  Hepatitis B blood test.  Sexually transmitted disease (STD) testing.  Diabetes screening. This is done by checking your blood sugar (glucose) after you have not eaten for a while (fasting). You may have this done every 1-3 years.  Mammogram. This may be done every 1-2 years. Talk to your health care provider about when you should start having regular mammograms. This may depend on whether you have a family history of breast cancer.  BRCA-related cancer screening.  This may be done if you have a family history of breast, ovarian, tubal, or peritoneal cancers.  Pelvic exam and Pap test. This may be done every 3 years starting at age 58. Starting at age 33, this may be done every 5 years if you have a Pap test in combination with an HPV test.  Bone density scan. This is done to screen  for osteoporosis. You may have this scan if you are at high risk for osteoporosis. Discuss your test results, treatment options, and if necessary, the need for more tests with your health care provider. Vaccines  Your health care provider may recommend certain vaccines, such as:  Influenza vaccine. This is recommended every year.  Tetanus, diphtheria, and acellular pertussis (Tdap, Td) vaccine. You may need a Td booster every 10 years.  Zoster vaccine. You may need this after age 75.  Pneumococcal 13-valent conjugate (PCV13) vaccine. You may need this if you have certain conditions and were not previously vaccinated.  Pneumococcal polysaccharide (PPSV23) vaccine. You may need one or two doses if you smoke cigarettes or if you have certain conditions. Talk to your health care provider about which screenings and vaccines you need and how often you need them. This information is not intended to replace advice given to you by your health care provider. Make sure you discuss any questions you have with your health care provider. Document Released: 04/30/2015 Document Revised: 12/22/2015 Document Reviewed: 02/02/2015 Elsevier Interactive Patient Education  2017 [employer] Prevention in the Home Falls can cause injuries. They can happen to people of all ages. There are many things you can do to make your home safe and to help prevent falls. What can I do on the outside of my home?  Regularly fix the edges of walkways and driveways and fix any cracks.  Remove anything that might make you trip as you walk through a door, such as a raised step or threshold.  Trim any bushes or trees on the path to your home.  Use bright outdoor lighting.  Clear any walking paths of anything that might make someone trip, such as rocks or tools.  Regularly check to see if handrails are loose or broken. Make sure that both sides of any steps have handrails.  Any raised decks and porches should  have guardrails on the edges.  Have any leaves, snow, or ice cleared regularly.  Use sand or salt on walking paths during winter.  Clean up any spills in your garage right away. This includes oil or grease spills. What can I do in the bathroom?  Use night lights.  Install grab bars by the toilet and in the tub and shower. Do not use towel bars as grab bars.  Use non-skid mats or decals in the tub or shower.  If you need to sit down in the shower, use a plastic, non-slip stool.  Keep the floor dry. Clean up any water that spills on the floor as soon as it happens.  Remove soap buildup in the tub or shower regularly.  Attach bath mats securely with double-sided non-slip rug tape.  Do not have throw rugs and other things on the floor that can make you trip. What can I do in the bedroom?  Use night lights.  Make sure that you have a light by your bed that is easy to reach.  Do not use any sheets or blankets that are too big for your bed. They should not hang  down onto the floor.  Have a firm chair that has side arms. You can use this for support while you get dressed.  Do not have throw rugs and other things on the floor that can make you trip. What can I do in the kitchen?  Clean up any spills right away.  Avoid walking on wet floors.  Keep items that you use a lot in easy-to-reach places.  If you need to reach something above you, use a strong step stool that has a grab bar.  Keep electrical cords out of the way.  Do not use floor polish or wax that makes floors slippery. If you must use wax, use non-skid floor wax.  Do not have throw rugs and other things on the floor that can make you trip. What can I do with my stairs?  Do not leave any items on the stairs.  Make sure that there are handrails on both sides of the stairs and use them. Fix handrails that are broken or loose. Make sure that handrails are as long as the stairways.  Check any carpeting to make sure  that it is firmly attached to the stairs. Fix any carpet that is loose or worn.  Avoid having throw rugs at the top or bottom of the stairs. If you do have throw rugs, attach them to the floor with carpet tape.  Make sure that you have a light switch at the top of the stairs and the bottom of the stairs. If you do not have them, ask someone to add them for you. What else can I do to help prevent falls?  Wear shoes that:  Do not have high heels.  Have rubber bottoms.  Are comfortable and fit you well.  Are closed at the toe. Do not wear sandals.  If you use a stepladder:  Make sure that it is fully opened. Do not climb a closed stepladder.  Make sure that both sides of the stepladder are locked into place.  Ask someone to hold it for you, if possible.  Clearly mark and make sure that you can see:  Any grab bars or handrails.  First and last steps.  Where the edge of each step is.  Use tools that help you move around (mobility aids) if they are needed. These include:  Canes.  Walkers.  Scooters.  Crutches.  Turn on the lights when you go into a dark area. Replace any light bulbs as soon as they burn out.  Set up your furniture so you have a clear path. Avoid moving your furniture around.  If any of your floors are uneven, fix them.  If there are any pets around you, be aware of where they are.  Review your medicines with your doctor. Some medicines can make you feel dizzy. This can increase your chance of falling. Ask your doctor what other things that you can do to help prevent falls. This information is not intended to replace advice given to you by your health care provider. Make sure you discuss any questions you have with your health care provider. Document Released: 01/28/2009 Document Revised: 09/09/2015 Document Reviewed: 05/08/2014 Elsevier Interactive Patient Education  2017 [employer].

## 2017-07-10 NOTE — Telephone Encounter (Signed)
Patient would like to have a Dexa scan ordered. She is concerned about her bone health. Advised patient I would send a message to her provider to have this ordered because my standing orders allow me to order for 65 and older. Patient is 62 years old.

## 2017-07-10 NOTE — Progress Notes (Signed)
Subjective:   Emily Steele is a 62 y.o. female who presents for an Initial Medicare Annual Wellness Visit.  Review of Systems    N/A  Cardiac Risk Factors include: obesity (BMI >30kg/m2)     Objective:    Today's Vitals   07/10/17 1052 07/10/17 1104  BP: 100/74   Pulse: 95   SpO2: 96%   Weight: 162 lb 4 oz (73.6 kg)   Height: 4\' 10"  (1.473 m)   PainSc:  2    Body mass index is 33.91 kg/m.  Advanced Directives 07/10/2017 08/18/2013 08/12/2013 08/12/2013 08/18/2012 08/17/2012  Does Patient Have a Medical Advance Directive? Yes Patient has advance directive, copy in chart Patient has advance directive, copy not in chart Patient has advance directive, copy not in chart Patient does not have advance directive Patient does not have advance directive  Type of Advance Directive [place];Living will - [place];Living will - - -  Copy of [place] in Chart? Yes - Copy requested from family - - -  Pre-existing out of facility DNR order (yellow form or pink MOST form) - - - - No -  Some encounter information is confidential and restricted. Go to Review Flowsheets activity to see all data.    Current Medications (verified) Outpatient Encounter Medications as of 07/10/2017  Medication Sig  . buPROPion (WELLBUTRIN SR) 150 MG 12 hr tablet Take 1 tablet (150 mg total) by mouth 2 (two) times daily.  . clindamycin (CLEOCIN T) 1 % lotion Apply topically 2 (two) times daily.  . DULoxetine (CYMBALTA) 60 MG capsule Take 1 capsule (60 mg total) by mouth 2 (two) times daily.  Marland Kitchen levothyroxine (SYNTHROID, LEVOTHROID) 50 MCG tablet Take 1 tablet (50 mcg total) by mouth daily before breakfast.  . Omega-3 Fatty Acids (SEA-OMEGA 70 PO) Take by mouth daily.  Marland Kitchen OVER THE COUNTER MEDICATION GRS Ultra Cell Defense 2 capsules daily  . OVER THE COUNTER MEDICATION Glucose Defense 2 capsules daily  . OVER THE COUNTER MEDICATION Ultra Omega Burn 1 capsule daily    . Probiotic Product (PA PROBIOTIC COMPLEX PO) Take 1 capsule by mouth daily.  Marland Kitchen tretinoin (RETIN-A) 0.05 % cream Apply topically at bedtime.  . temazepam (RESTORIL) 15 MG capsule Take 1 capsule (15 mg total) by mouth at bedtime as needed for sleep. (Patient not taking: Reported on 07/10/2017)   No facility-administered encounter medications on file as of 07/10/2017.     Allergies (verified) Doxycycline; Sulfa antibiotics; Tape; Temazepam; and Methocarbamol   History: Past Medical History:  Diagnosis Date  . Allergy   . Anemia   . Anxiety   . Arthritis   . Brain tumor ([place])    right temporal, not ca  . Constipation   . DDD (degenerative disc disease)   . Depression   . Eczema   . Fibromyalgia   . Fibromyalgia   . GERD (gastroesophageal reflux disease)   . Hearing loss    1 ear - mild  . Herpes simplex   . Hx of migraine headaches   . Hypertension    not on medications  . Hypothyroid   . Obesity   . Osteoarthritis   . Osteopenia   . Osteoporosis   . Overactive bladder   . Plantar fasciitis   . Pneumonia 08/2012  . Rosacea   . Sleep behavior disorder, REM   . Toenail fungus   . UTI (urinary tract infection)    Past Surgical History:  Procedure Laterality Date  . ABDOMINAL HYSTERECTOMY     uterine fibroid; ovaries intact. DUB.  Marland Kitchen HAND SURGERY Bilateral    ligimant transfere to thumbs  . LAMINECTOMY  08/18/2013   L 4 L5    DR CRAM  . MAXIMUM ACCESS (MAS)POSTERIOR LUMBAR INTERBODY FUSION (PLIF) 1 LEVEL N/A 08/18/2013   Procedure: FOR MAXIMUM ACCESS (MAS) POSTERIOR LUMBAR INTERBODY FUSION (PLIF) 1 LEVEL;  Surgeon: Elaina Hoops, MD;  Location: [facility];  Service: Neurosurgery;  Laterality: N/A;  FOR MAXIMUM ACCESS (MAS) POSTERIOR LUMBAR INTERBODY FUSION (PLIF) 1 LEVEL L4-5  . SPINE SURGERY     Lamincetomy and Disectomy L5- S1  . TONSILLECTOMY AND ADENOIDECTOMY     Family History  Problem Relation Age of Onset  . Cancer Mother 75       lung cancer  . Hypertension  Mother   . Cancer Sister        Breast cancer  . Heart disease Sister   . Asthma Sister   . Asthma Brother   . Drug abuse Brother   . Hypertension Brother    Social History   Socioeconomic History  . Marital status: Single    Spouse name: Not on file  . Number of children: 0  . Years of education: college  . Highest education level: Not on file  Occupational History    Employer: OTHER    Comment: n/a  Social Needs  . Financial resource strain: Not hard at all  . Food insecurity:    Worry: Never true    Inability: Never true  . Transportation needs:    Medical: No    Non-medical: No  Tobacco Use  . Smoking status: Never Smoker  . Smokeless tobacco: Never Used  Substance and Sexual Activity  . Alcohol use: No    Alcohol/week: 0.0 oz  . Drug use: No  . Sexual activity: Never    Birth control/protection: Post-menopausal, Surgical  Lifestyle  . Physical activity:    Days per week: 0 days    Minutes per session: 0 min  . Stress: Rather much  Relationships  . Social connections:    Talks on phone: Never    Gets together: Never    Attends religious service: Never    Active member of club or organization: No    Attends meetings of clubs or organizations: Never    Relationship status: Never married  Other Topics Concern  . Not on file  Social History Narrative   Marital status: single; not dating.        Children: none      Lives: Patient lives at home alone.  Originally from [place]; moved to [place] in 2000.      Employment: disability in 2011 due to DDD lumbar.  Education officer, [employer] for [employer].     Tobacco: none      Alcohol: none      Exercise:       ADLs: independent with ADLs in 2019; drives.      Advanced Directives: FULL CODE no prolonged measures.           Caffeine Use: 3-4 cups weekly   Single. Education: [employer].  Exercises 4x's a week.    Tobacco Counseling Counseling given: Not Answered   Clinical Intake:  Pre-visit preparation completed:  Yes  Pain : 0-10 Pain Score: 2  Pain Type: Chronic pain Pain Location: Buttocks Pain Orientation: Right Pain Descriptors / Indicators: Cramping Pain Onset: More than a month ago Pain Frequency:  Intermittent     Nutritional Status: BMI > 30  Obese Nutritional Risks: None Diabetes: No  How often do you need to have someone help you when you read instructions, pamphlets, or other written materials from your doctor or pharmacy?: 1 - Never What is the last grade level you completed in school?: Masters Degree  Interpreter Needed?: No  Information entered by :: Andrez Grime, LPN   Activities of Daily Living In your present state of health, do you have any difficulty performing the following activities: 07/10/2017  Hearing? N  Vision? N  Difficulty concentrating or making decisions? Y  Comment short term memory issues   Walking or climbing stairs? Y  Comment hard to climb stairs   Dressing or bathing? N  Doing errands, shopping? N  Preparing Food and eating ? N  Using the Toilet? N  In the past six months, have you accidently leaked urine? Y  Comment Patient wears a pad sometimes  Do you have problems with loss of bowel control? N  Managing your Medications? N  Managing your Finances? N  Housekeeping or managing your Housekeeping? N  Some recent data might be hidden     Immunizations and Health Maintenance Immunization History  Administered Date(s) Administered  . Influenza Split 02/29/2012  . Influenza,inj,Quad PF,6+ Mos 02/07/2013, 04/08/2014, 03/12/2015  . Influenza-Unspecified 03/17/2016, 03/17/2017  . Td 04/17/2010  . Zoster Recombinat (Shingrix) 10/22/2016, 12/18/2016   Health Maintenance Due  Topic Date Due  . PAP SMEAR  07/09/2017    Patient Care Team: Wardell Honour, MD as PCP - General (Family Medicine) Kary Kos, MD as Consulting Physician (Neurosurgery) Daron Offer, Richard Miu, MD as Consulting Physician (Psychiatry)  Indicate any recent  Medical Services you may have received from other than [facility] providers in the past year (date may be approximate).     Assessment:   This is a routine wellness examination for Jaylon.  Hearing/Vision screen Hearing Screening Comments: Patient had a hearing exam done back in 1994.  Vision Screening Comments: Patient does not see an eye doctor currently but plans on going soon.   Dietary issues and exercise activities discussed: Current Exercise Habits: The patient does not participate in regular exercise at present, Exercise limited by: None identified  Goals    . Exercise 3x per week (30 min per time)     Patient wants to try to start exercising more and join Silver sneakers.       Depression Screen PHQ 2/9 Scores 07/10/2017 05/23/2017 01/29/2017 01/13/2017 11/01/2016 10/12/2016 09/23/2016  PHQ - 2 Score 4 0 0 0 0 6 0  PHQ- 9 Score 10 - - - - 14 -    Fall Risk Fall Risk  07/10/2017 05/23/2017 01/29/2017 01/13/2017 11/01/2016  Falls in the past year? Yes No No No No  Number falls in past yr: 2 or more - - - -  Injury with Fall? No - - - -  Risk for fall due to : (No Data) - - - -  Risk for fall due to: Comment tripped over something both times - - - -  Follow up Falls prevention discussed - - - -    Is the patient's home free of loose throw rugs in walkways, pet beds, electrical cords, etc?   yes      Grab bars in the bathroom? yes      Handrails on the stairs?   yes      Adequate lighting?   yes  Timed Get  Up and Go Performed: yes, completed within 30 seconds   Cognitive Function: MMSE - Mini Mental State Exam 11/05/2015  Not completed: Unable to complete   [facility] Cognitive Assessment  01/07/2015  Visuospatial/ Executive (0/5) 4  Naming (0/3) 3  Attention: Read list of digits (0/2) 2  Attention: Read list of letters (0/1) 1  Attention: Serial 7 subtraction starting at 100 (0/3) 3  Language: Repeat phrase (0/2) 2  Language : Fluency (0/1) 1  Abstraction (0/2) 2  Delayed Recall  (0/5) 4  Orientation (0/6) 5  Total 27  Adjusted Score (based on education) 27   6CIT Screen 07/10/2017  What Year? 0 points  What month? 0 points  What time? 0 points  Count back from 20 0 points  Months in reverse 0 points  Repeat phrase 0 points  Total Score 0    Screening Tests Health Maintenance  Topic Date Due  . PAP SMEAR  07/09/2017  . COLONOSCOPY  09/09/2017 (Originally 04/17/2017)  . MAMMOGRAM  12/15/2017 (Originally 04/17/2013)  . TETANUS/TDAP  04/17/2020  . INFLUENZA VACCINE  Completed  . Hepatitis C Screening  Completed  . HIV Screening  Completed    Qualifies for Shingles Vaccine: Up to date  Cancer Screenings: Lung: Low Dose CT Chest recommended if Age 24-80 years, 30 pack-year currently smoking OR have quit w/in 15years. Patient does not qualify. Breast: Up to date on Mammogram? No, Patient declined at this time Up to date of Bone Density/Dexa? Patient wants Dexa scan. Advised patient message sent to provider about ordering this for her since she is under the age of 37.  Colorectal: declined at this time, Patient states that she will call and have this setup herself at a later date.   Additional Screenings:  Hepatitis B/HIV/Syphillis: HIV completed 08/18/12, Hep B and Syphillis not indicated Hepatitis C Screening: completed 03/12/15    Plan:   I have personally reviewed and noted the following in the patient's chart:   . Medical and social history . Use of alcohol, tobacco or illicit drugs  . Current medications and supplements . Functional ability and status . Nutritional status . Physical activity . Advanced directives . List of other physicians . Hospitalizations, surgeries, and ER visits in previous 12 months . Vitals . Screenings to include cognitive, depression, and falls . Referrals and appointments  In addition, I have reviewed and discussed with patient certain preventive protocols, quality metrics, and best practice recommendations. A written  personalized care plan for preventive services as well as general preventive health recommendations were provided to patient.    1. Encounter for Medicare annual wellness exam    Andrez Grime, LPN   [id number]

## 2017-07-12 ENCOUNTER — Encounter: Payer: Self-pay | Admitting: Family Medicine

## 2017-07-12 ENCOUNTER — Other Ambulatory Visit (HOSPITAL_COMMUNITY): Payer: Self-pay | Admitting: Psychiatry

## 2017-07-12 ENCOUNTER — Encounter (HOSPITAL_COMMUNITY): Payer: Self-pay | Admitting: Psychiatry

## 2017-07-12 DIAGNOSIS — F32A Depression, unspecified: Secondary | ICD-10-CM

## 2017-07-12 DIAGNOSIS — F329 Major depressive disorder, single episode, unspecified: Secondary | ICD-10-CM

## 2017-07-12 DIAGNOSIS — F5104 Psychophysiologic insomnia: Secondary | ICD-10-CM

## 2017-07-12 DIAGNOSIS — G894 Chronic pain syndrome: Secondary | ICD-10-CM

## 2017-07-12 MED ORDER — TEMAZEPAM 15 MG PO CAPS: 15.0000 mg | ORAL_CAPSULE | Freq: Every evening | ORAL | 1 refills | Status: DC | PRN

## 2017-08-15 DIAGNOSIS — R32 Unspecified urinary incontinence: Secondary | ICD-10-CM | POA: Diagnosis not present

## 2017-09-10 ENCOUNTER — Encounter: Payer: Self-pay | Admitting: Family Medicine

## 2017-09-19 ENCOUNTER — Ambulatory Visit (HOSPITAL_COMMUNITY): Payer: Medicare HMO | Admitting: Psychiatry

## 2017-09-19 ENCOUNTER — Encounter (HOSPITAL_COMMUNITY): Payer: Self-pay | Admitting: Psychiatry

## 2017-09-19 DIAGNOSIS — F5104 Psychophysiologic insomnia: Secondary | ICD-10-CM

## 2017-09-19 DIAGNOSIS — F329 Major depressive disorder, single episode, unspecified: Secondary | ICD-10-CM

## 2017-09-19 DIAGNOSIS — G4721 Circadian rhythm sleep disorder, delayed sleep phase type: Secondary | ICD-10-CM | POA: Diagnosis not present

## 2017-09-19 DIAGNOSIS — Z813 Family history of other psychoactive substance abuse and dependence: Secondary | ICD-10-CM | POA: Diagnosis not present

## 2017-09-19 DIAGNOSIS — R69 Illness, unspecified: Secondary | ICD-10-CM | POA: Diagnosis not present

## 2017-09-19 DIAGNOSIS — F32A Depression, unspecified: Secondary | ICD-10-CM

## 2017-09-19 MED ORDER — LORAZEPAM 0.5 MG PO TABS: ORAL_TABLET | ORAL | 0 refills | Status: AC

## 2017-09-19 NOTE — Progress Notes (Signed)
[facility] MD/PA/NP OP Progress Note  09/19/2017 3:32 PM Emily Steele  MRN:  [id number]  Chief Complaint: med check HPI: Emily Steele reports that things are going well overall, taking Cymbalta and Wellbutrin consistently.  Denies any acute suicidality or safety concerns.  Not taking the Restoril because of the cost, but melatonin 3 mg has been very helpful and effective.  Agrees to continue the current regimen and follow-up in 3 months or sooner if needed  Disclosed to patient that this Probation officer is leaving this practice at the end of August 2019, and patients always has the right to choose their provider. Reassured patient that office will work to provide smooth transition of care whether they wish to remain at this office, or to continue with this provider, or seek alternative care options in community.  They expressed understanding.   Visit Diagnosis:    ICD-10-CM   1. Psychophysiological insomnia F51.04   2. Depression, unspecified depression type F32.9   3. Sleep disorder, circadian, delayed sleep phase type G47.21     Past Psychiatric History: See intake H&P for full details. Reviewed, with no updates at this time.   Past Medical History:  Past Medical History:  Diagnosis Date  . Allergy   . Anemia   . Anxiety   . Arthritis   . Brain tumor ([place])    right temporal, not ca  . Constipation   . DDD (degenerative disc disease)   . Depression   . Eczema   . Fibromyalgia   . Fibromyalgia   . GERD (gastroesophageal reflux disease)   . Hearing loss    1 ear - mild  . Herpes simplex   . Hx of migraine headaches   . Hypertension    not on medications  . Hypothyroid   . Obesity   . Osteoarthritis   . Osteopenia   . Osteoporosis   . Overactive bladder   . Plantar fasciitis   . Pneumonia 08/2012  . Rosacea   . Sleep behavior disorder, REM   . Toenail fungus   . UTI (urinary tract infection)     Past Surgical History:  Procedure Laterality Date  . ABDOMINAL HYSTERECTOMY     uterine fibroid; ovaries intact. DUB.  Marland Kitchen HAND SURGERY Bilateral    ligimant transfere to thumbs  . LAMINECTOMY  08/18/2013   L 4 L5    DR CRAM  . MAXIMUM ACCESS (MAS)POSTERIOR LUMBAR INTERBODY FUSION (PLIF) 1 LEVEL N/A 08/18/2013   Procedure: FOR MAXIMUM ACCESS (MAS) POSTERIOR LUMBAR INTERBODY FUSION (PLIF) 1 LEVEL;  Surgeon: Elaina Hoops, MD;  Location: [facility];  Service: Neurosurgery;  Laterality: N/A;  FOR MAXIMUM ACCESS (MAS) POSTERIOR LUMBAR INTERBODY FUSION (PLIF) 1 LEVEL L4-5  . SPINE SURGERY     Lamincetomy and Disectomy L5- S1  . TONSILLECTOMY AND ADENOIDECTOMY      Family Psychiatric History: See intake H&P for full details. Reviewed, with no updates at this time.   Family History:  Family History  Problem Relation Age of Onset  . Cancer Mother 55       lung cancer  . Hypertension Mother   . Cancer Sister        Breast cancer  . Heart disease Sister   . Asthma Sister   . Asthma Brother   . Drug abuse Brother   . Hypertension Brother     Social History:  Social History   Socioeconomic History  . Marital status: Single    Spouse name: Not  on file  . Number of children: 0  . Years of education: college  . Highest education level: Not on file  Occupational History    Employer: OTHER    Comment: n/a  Social Needs  . Financial resource strain: Not hard at all  . Food insecurity:    Worry: Never true    Inability: Never true  . Transportation needs:    Medical: No    Non-medical: No  Tobacco Use  . Smoking status: Never Smoker  . Smokeless tobacco: Never Used  Substance and Sexual Activity  . Alcohol use: No    Alcohol/week: 0.0 oz  . Drug use: No  . Sexual activity: Never    Birth control/protection: Post-menopausal, Surgical  Lifestyle  . Physical activity:    Days per week: 0 days    Minutes per session: 0 min  . Stress: Rather much  Relationships  . Social connections:    Talks on phone: Never    Gets together: Never    Attends religious  service: Never    Active member of club or organization: No    Attends meetings of clubs or organizations: Never    Relationship status: Never married  Other Topics Concern  . Not on file  Social History Narrative   Marital status: single; not dating.        Children: none      Lives: Patient lives at home alone.  Originally from [place]; moved to [place] in 2000.      Employment: disability in 2011 due to DDD lumbar.  Education officer, [employer].     Tobacco: none      Alcohol: none      Exercise:       ADLs: independent with ADLs in 2019; drives.      Advanced Directives: FULL CODE no prolonged measures.           Caffeine Use: 3-4 cups weekly   Single. Education: [employer].  Exercises 4x's a week.    Allergies:  Allergies  Allergen Reactions  . Doxycycline Other (See Comments)    headaches  . Sulfa Antibiotics     Headaches  . Tape Itching  . Temazepam Other (See Comments)    Experiences bad dreams with generic form  . Methocarbamol Nausea Only    headaches    Metabolic Disorder Labs: Lab Results  Component Value Date   HGBA1C 5.4 07/10/2014   No results found for: PROLACTIN Lab Results  Component Value Date   CHOL 233 (H) 05/23/2017   TRIG 104 05/23/2017   HDL 61 05/23/2017   CHOLHDL 3.8 05/23/2017   LDLCALC 151 (H) 05/23/2017   Lab Results  Component Value Date   TSH 4.700 (H) 05/23/2017   TSH 2.210 01/13/2017    Therapeutic Level Labs: No results found for: LITHIUM No results found for: VALPROATE No components found for:  CBMZ  Current Medications: Current Outpatient Medications  Medication Sig Dispense Refill  . buPROPion (WELLBUTRIN SR) 150 MG 12 hr tablet Take 1 tablet (150 mg total) by mouth 2 (two) times daily. 180 tablet 1  . clindamycin (CLEOCIN T) 1 % lotion Apply topically 2 (two) times daily. 60 mL 0  . DULoxetine (CYMBALTA) 60 MG capsule Take 1 capsule (60 mg total) by mouth 2 (two) times daily. 180 capsule 1  . levothyroxine (SYNTHROID,  LEVOTHROID) 50 MCG tablet Take 1 tablet (50 mcg total) by mouth daily before breakfast. 90 tablet 1  . LORazepam (ATIVAN) 0.5 MG  tablet Okay to take 2-3 times per month for anxiety attacks 30 tablet 0  . Omega-3 Fatty Acids (SEA-OMEGA 70 PO) Take by mouth daily.    Marland Kitchen OVER THE COUNTER MEDICATION GRS Ultra Cell Defense 2 capsules daily    . OVER THE COUNTER MEDICATION Glucose Defense 2 capsules daily    . OVER THE COUNTER MEDICATION Ultra Omega Burn 1 capsule daily    . Probiotic Product (PA PROBIOTIC COMPLEX PO) Take 1 capsule by mouth daily.    Marland Kitchen tretinoin (RETIN-A) 0.05 % cream Apply topically at bedtime. 45 g 1   No current facility-administered medications for this visit.      Musculoskeletal: Strength & Muscle Tone: within normal limits Gait & Station: normal Patient leans: N/A  Psychiatric Specialty Exam: ROS  There were no vitals taken for this visit.There is no height or weight on file to calculate BMI.  General Appearance: Casual and Well Groomed  Eye Contact:  Good  Speech:  Clear and Coherent and Normal Rate  Volume:  Normal  Mood:  Euthymic  Affect:  Appropriate and Congruent  Thought Process:  Coherent and Descriptions of Associations: Intact  Orientation:  Full (Time, Place, and Person)  Thought Content: Logical   Suicidal Thoughts:  No  Homicidal Thoughts:  No  Memory:  Immediate;   Good  Judgement:  Good  Insight:  Good  Psychomotor Activity:  Normal  Concentration:  Concentration: Good  Recall:  Good  Fund of Knowledge: Good  Language: Good  Akathisia:  Negative  Handed:  Right  AIMS (if indicated): not done  Assets:  Communication Skills Desire for Improvement Financial Resources/Insurance Housing  ADL's:  Intact  Cognition: WNL  Sleep:  Good   Screenings: PHQ2-9     Clinical Support from 07/10/2017 in [facility] at [facility] from 05/23/2017 in [facility] at [facility] from 01/29/2017 in [facility] at [facility] from  01/13/2017 in [facility] [place] from 11/01/2016 in [facility]  PHQ-2 Total Score  4  0  0  0  0  PHQ-9 Total Score  10  -  -  -  -       Assessment and Plan:  ALEASHA FREGEAU presents with overall stable depressive symptoms on Cymbalta 60 mg twice daily and Wellbutrin 150 mg twice daily.  She is sleeping well with melatonin and no longer using Restoril.  She has lorazepam tablets available for acute anxiety as needed, which she requires very sparingly.  She has had some recent travels and visits with her friends to [place].  She seems to keep active with hobbies and social outings.  No acute safety issues, we will follow-up in 3 months.  1. Psychophysiological insomnia   2. Depression, unspecified depression type   3. Sleep disorder, circadian, delayed sleep phase type     Status of current problems: stable  Labs Ordered: No orders of the defined types were placed in this encounter.   Labs Reviewed: n/a  Collateral Obtained/Records Reviewed: n/a  Plan:  Continue Cymbalta 60 mg twice a day Continue Wellbutrin 150 mg twice a day Ativan 0.5 mg 2-3 times per month as needed for anxiety Melatonin 3-5 mg nightly  Aundra Dubin, MD 09/19/2017, 3:32 PM

## 2017-09-21 ENCOUNTER — Other Ambulatory Visit: Payer: Self-pay

## 2017-10-25 ENCOUNTER — Encounter (HOSPITAL_COMMUNITY): Payer: Self-pay | Admitting: Psychiatry

## 2017-11-05 ENCOUNTER — Encounter (HOSPITAL_COMMUNITY): Payer: Self-pay | Admitting: Psychiatry

## 2017-11-27 ENCOUNTER — Encounter (HOSPITAL_COMMUNITY): Payer: Self-pay | Admitting: Psychiatry

## 2017-11-27 ENCOUNTER — Ambulatory Visit (HOSPITAL_COMMUNITY): Payer: Medicare HMO | Admitting: Psychiatry

## 2017-11-27 VITALS — BP 128/84 | HR 78 | Ht <= 58 in | Wt 159.0 lb

## 2017-11-27 DIAGNOSIS — F329 Major depressive disorder, single episode, unspecified: Secondary | ICD-10-CM | POA: Diagnosis not present

## 2017-11-27 DIAGNOSIS — G4721 Circadian rhythm sleep disorder, delayed sleep phase type: Secondary | ICD-10-CM

## 2017-11-27 DIAGNOSIS — F32A Depression, unspecified: Secondary | ICD-10-CM

## 2017-11-27 DIAGNOSIS — Z813 Family history of other psychoactive substance abuse and dependence: Secondary | ICD-10-CM | POA: Diagnosis not present

## 2017-11-27 DIAGNOSIS — R69 Illness, unspecified: Secondary | ICD-10-CM | POA: Diagnosis not present

## 2017-11-27 MED ORDER — TRAZODONE HCL 50 MG PO TABS: 50.0000 mg | ORAL_TABLET | Freq: Every day | ORAL | 0 refills | Status: AC

## 2017-11-27 NOTE — Progress Notes (Signed)
[place] [facility] MD/PA/NP OP Progress Note  11/27/2017 10:53 AM Emily Steele  MRN:  [id number]  Chief Complaint: med check  HPI: Emily Steele reports that she is having some issues with grief and we discussed some group therapies and interventions that may help with her processing her ongoing grief related to the loss of her mom.  She is quite receptive to this.  We had a nice discussion about her mother and some of the things she misses about her mom.  She reports that she is sleeping much better with the melatonin and this has been quite effective for her.  We agreed to augment with trazodone given some ongoing difficulties with waking up around 3-3:30 every night.  No acute safety concerns and we will follow-up in 8 weeks at the new practice as she has elected to continue care with this Probation officer.  Visit Diagnosis:    ICD-10-CM   1. Depression, unspecified depression type F32.9 traZODone (DESYREL) 50 MG tablet  2. Sleep disorder, circadian, delayed sleep phase type G47.21 traZODone (DESYREL) 50 MG tablet    Past Psychiatric History: See intake H&P for full details. Reviewed, with no updates at this time.   Past Medical History:  Past Medical History:  Diagnosis Date  . Allergy   . Anemia   . Anxiety   . Arthritis   . Brain tumor ([place])    right temporal, not ca  . Constipation   . DDD (degenerative disc disease)   . Depression   . Eczema   . Fibromyalgia   . Fibromyalgia   . GERD (gastroesophageal reflux disease)   . Hearing loss    1 ear - mild  . Herpes simplex   . Hx of migraine headaches   . Hypertension    not on medications  . Hypothyroid   . Obesity   . Osteoarthritis   . Osteopenia   . Osteoporosis   . Overactive bladder   . Plantar fasciitis   . Pneumonia 08/2012  . Rosacea   . Sleep behavior disorder, REM   . Toenail fungus   . UTI (urinary tract infection)     Past Surgical History:  Procedure Laterality Date  . ABDOMINAL HYSTERECTOMY     uterine fibroid; ovaries  intact. DUB.  Marland Kitchen HAND SURGERY Bilateral    ligimant transfere to thumbs  . LAMINECTOMY  08/18/2013   L 4 L5    DR CRAM  . MAXIMUM ACCESS (MAS)POSTERIOR LUMBAR INTERBODY FUSION (PLIF) 1 LEVEL N/A 08/18/2013   Procedure: FOR MAXIMUM ACCESS (MAS) POSTERIOR LUMBAR INTERBODY FUSION (PLIF) 1 LEVEL;  Surgeon: Elaina Hoops, MD;  Location: [facility];  Service: Neurosurgery;  Laterality: N/A;  FOR MAXIMUM ACCESS (MAS) POSTERIOR LUMBAR INTERBODY FUSION (PLIF) 1 LEVEL L4-5  . SPINE SURGERY     Lamincetomy and Disectomy L5- S1  . TONSILLECTOMY AND ADENOIDECTOMY      Family Psychiatric History: See intake H&P for full details. Reviewed, with no updates at this time.   Family History:  Family History  Problem Relation Age of Onset  . Cancer Mother 4       lung cancer  . Hypertension Mother   . Cancer Sister        Breast cancer  . Heart disease Sister   . Asthma Sister   . Asthma Brother   . Drug abuse Brother   . Hypertension Brother     Social History:  Social History   Socioeconomic History  . Marital status:  Single    Spouse name: Not on file  . Number of children: 0  . Years of education: college  . Highest education level: Not on file  Occupational History    Employer: OTHER    Comment: n/a  Social Needs  . Financial resource strain: Not hard at all  . Food insecurity:    Worry: Never true    Inability: Never true  . Transportation needs:    Medical: No    Non-medical: No  Tobacco Use  . Smoking status: Never Smoker  . Smokeless tobacco: Never Used  Substance and Sexual Activity  . Alcohol use: No    Alcohol/week: 0.0 standard drinks  . Drug use: No  . Sexual activity: Never    Birth control/protection: Post-menopausal, Surgical  Lifestyle  . Physical activity:    Days per week: 0 days    Minutes per session: 0 min  . Stress: Rather much  Relationships  . Social connections:    Talks on phone: Never    Gets together: Never    Attends religious service: Never     Active member of club or organization: No    Attends meetings of clubs or organizations: Never    Relationship status: Never married  Other Topics Concern  . Not on file  Social History Narrative   Marital status: single; not dating.        Children: none      Lives: Patient lives at home alone.  Originally from [place]; moved to [place] in 2000.      Employment: disability in 2011 due to DDD lumbar.  Education officer, [employer].     Tobacco: none      Alcohol: none      Exercise:       ADLs: independent with ADLs in 2019; drives.      Advanced Directives: FULL CODE no prolonged measures.           Caffeine Use: 3-4 cups weekly   Single. Education: [employer].  Exercises 4x's a week.    Allergies:  Allergies  Allergen Reactions  . Doxycycline Other (See Comments)    headaches  . Sulfa Antibiotics     Headaches  . Tape Itching  . Temazepam Other (See Comments)    Experiences bad dreams with generic form  . Methocarbamol Nausea Only    headaches    Metabolic Disorder Labs: Lab Results  Component Value Date   HGBA1C 5.4 07/10/2014   No results found for: PROLACTIN Lab Results  Component Value Date   CHOL 233 (H) 05/23/2017   TRIG 104 05/23/2017   HDL 61 05/23/2017   CHOLHDL 3.8 05/23/2017   LDLCALC 151 (H) 05/23/2017   Lab Results  Component Value Date   TSH 4.700 (H) 05/23/2017   TSH 2.210 01/13/2017    Therapeutic Level Labs: No results found for: LITHIUM No results found for: VALPROATE No components found for:  CBMZ  Current Medications: Current Outpatient Medications  Medication Sig Dispense Refill  . buPROPion (WELLBUTRIN SR) 150 MG 12 hr tablet Take 1 tablet (150 mg total) by mouth 2 (two) times daily. 180 tablet 1  . clindamycin (CLEOCIN T) 1 % lotion Apply topically 2 (two) times daily. 60 mL 0  . DULoxetine (CYMBALTA) 60 MG capsule Take 1 capsule (60 mg total) by mouth 2 (two) times daily. 180 capsule 1  . levothyroxine (SYNTHROID, LEVOTHROID) 50  MCG tablet Take 1 tablet (50 mcg total) by mouth daily before breakfast. 90  tablet 1  . Melatonin 10 MG TABS Take by mouth.    Marland Kitchen OVER THE COUNTER MEDICATION GRS Ultra Cell Defense 2 capsules daily    . OVER THE COUNTER MEDICATION Ultra Omega Burn 1 capsule daily    . OVER THE COUNTER MEDICATION     . Probiotic Product (PA PROBIOTIC COMPLEX PO) Take 1 capsule by mouth daily.    Marland Kitchen tretinoin (RETIN-A) 0.05 % cream Apply topically at bedtime. 45 g 1  . LORazepam (ATIVAN) 0.5 MG tablet Okay to take 2-3 times per month for anxiety attacks (Patient not taking: Reported on 11/27/2017) 30 tablet 0  . Omega-3 Fatty Acids (SEA-OMEGA 70 PO) Take by mouth daily.    Marland Kitchen OVER THE COUNTER MEDICATION Glucose Defense 2 capsules daily    . traZODone (DESYREL) 50 MG tablet Take 1 tablet (50 mg total) by mouth at bedtime. 90 tablet 0   No current facility-administered medications for this visit.      Musculoskeletal: Strength & Muscle Tone: within normal limits Gait & Station: normal Patient leans: N/A  Psychiatric Specialty Exam: ROS  Blood pressure 128/84, pulse 78, height 4\' 10"  (1.473 m), weight 159 lb (72.1 kg), SpO2 98 %.Body mass index is 33.23 kg/m.  General Appearance: Casual and Well Groomed  Eye Contact:  Good  Speech:  Clear and Coherent and Normal Rate  Volume:  Normal  Mood:  Dysphoric and grieving  Affect:  Appropriate and Congruent  Thought Process:  Coherent and Descriptions of Associations: Intact  Orientation:  Full (Time, Place, and Person)  Thought Content: Logical   Suicidal Thoughts:  No  Homicidal Thoughts:  No  Memory:  Immediate;   Good  Judgement:  Good  Insight:  Good  Psychomotor Activity:  Normal  Concentration:  Concentration: Good  Recall:  Good  Fund of Knowledge: Good  Language: Good  Akathisia:  Negative  Handed:  Right  AIMS (if indicated): not done  Assets:  Communication Skills Desire for Improvement Financial Resources/Insurance Housing  ADL's:   Intact  Cognition: WNL  Sleep:  better   Screenings: PHQ2-9     Clinical Support from 07/10/2017 in [facility] at [facility] from 05/23/2017 in [facility] at [facility] from 01/29/2017 in [facility] from 01/13/2017 in [facility] from 11/01/2016 in [facility] at [facility]  PHQ-2 Total Score  4  0  0  0  0  PHQ-9 Total Score  10  -  -  -  -       Assessment and Plan:  Emily Steele presents with improving sleep on melatonin, would benefit from augmentation with trazodone.  She is now dealing with some grief and some regrets from her past, working on trying to process some of her relationships.  She is receptive to starting some group therapies and would like to find a grief group locally.  No acute safety concerns and we will follow-up in 8 weeks.  1. Depression, unspecified depression type   2. Sleep disorder, circadian, delayed sleep phase type     Status of current problems: stable  Labs Ordered: No orders of the defined types were placed in this encounter.   Labs Reviewed: n/a  Collateral Obtained/Records Reviewed: n/a  Plan:  Continue Cymbalta 60 mg twice a day Continue Wellbutrin 150 mg twice a day Ativan 0.5 mg 2-3 times per month as needed for anxiety Melatonin 3-5 mg nightly + trazodone 25-50 mg  nightly  Aundra Dubin, MD 11/27/2017, 10:53 AM

## 2017-12-03 ENCOUNTER — Other Ambulatory Visit: Payer: Self-pay | Admitting: Family Medicine

## 2017-12-26 ENCOUNTER — Other Ambulatory Visit (HOSPITAL_COMMUNITY): Payer: Self-pay | Admitting: Psychiatry

## 2017-12-26 DIAGNOSIS — F32A Depression, unspecified: Secondary | ICD-10-CM

## 2017-12-26 DIAGNOSIS — F329 Major depressive disorder, single episode, unspecified: Secondary | ICD-10-CM

## 2018-01-21 DIAGNOSIS — H5210 Myopia, unspecified eye: Secondary | ICD-10-CM | POA: Diagnosis not present

## 2018-01-21 DIAGNOSIS — Z0100 Encounter for examination of eyes and vision without abnormal findings: Secondary | ICD-10-CM | POA: Diagnosis not present

## 2018-01-30 DIAGNOSIS — F5101 Primary insomnia: Secondary | ICD-10-CM | POA: Diagnosis not present

## 2018-01-30 DIAGNOSIS — F4312 Post-traumatic stress disorder, chronic: Secondary | ICD-10-CM | POA: Diagnosis not present

## 2018-01-30 DIAGNOSIS — R69 Illness, unspecified: Secondary | ICD-10-CM | POA: Diagnosis not present

## 2018-03-21 DIAGNOSIS — R69 Illness, unspecified: Secondary | ICD-10-CM | POA: Diagnosis not present

## 2018-04-01 DIAGNOSIS — R69 Illness, unspecified: Secondary | ICD-10-CM | POA: Diagnosis not present

## 2018-04-26 DIAGNOSIS — F332 Major depressive disorder, recurrent severe without psychotic features: Secondary | ICD-10-CM | POA: Diagnosis not present

## 2018-04-26 DIAGNOSIS — F5101 Primary insomnia: Secondary | ICD-10-CM | POA: Diagnosis not present

## 2018-04-26 DIAGNOSIS — F4312 Post-traumatic stress disorder, chronic: Secondary | ICD-10-CM | POA: Diagnosis not present

## 2018-05-09 DIAGNOSIS — R030 Elevated blood-pressure reading, without diagnosis of hypertension: Secondary | ICD-10-CM | POA: Diagnosis not present

## 2018-05-09 DIAGNOSIS — Z6832 Body mass index (BMI) 32.0-32.9, adult: Secondary | ICD-10-CM | POA: Diagnosis not present

## 2018-05-09 DIAGNOSIS — M4316 Spondylolisthesis, lumbar region: Secondary | ICD-10-CM | POA: Diagnosis not present

## 2018-05-13 DIAGNOSIS — F5101 Primary insomnia: Secondary | ICD-10-CM | POA: Diagnosis not present

## 2018-05-13 DIAGNOSIS — F4312 Post-traumatic stress disorder, chronic: Secondary | ICD-10-CM | POA: Diagnosis not present

## 2018-05-13 DIAGNOSIS — F332 Major depressive disorder, recurrent severe without psychotic features: Secondary | ICD-10-CM | POA: Diagnosis not present

## 2018-05-13 DIAGNOSIS — R5382 Chronic fatigue, unspecified: Secondary | ICD-10-CM | POA: Diagnosis not present

## 2018-05-13 DIAGNOSIS — Z5181 Encounter for therapeutic drug level monitoring: Secondary | ICD-10-CM | POA: Diagnosis not present

## 2018-05-23 ENCOUNTER — Other Ambulatory Visit: Payer: Self-pay | Admitting: Family Medicine

## 2018-05-23 NOTE — Telephone Encounter (Signed)
Patient called and advised she will need to establish with a new provider in order to receive refills on medication, patient verbalized understanding. Physical appointment scheduled for Friday, 07/05/18 at 55 with Dr. Pamella Pert, advised a refill until the appointment will be sent in.

## 2018-05-24 DIAGNOSIS — F5101 Primary insomnia: Secondary | ICD-10-CM | POA: Diagnosis not present

## 2018-05-24 DIAGNOSIS — F4312 Post-traumatic stress disorder, chronic: Secondary | ICD-10-CM | POA: Diagnosis not present

## 2018-05-24 DIAGNOSIS — F332 Major depressive disorder, recurrent severe without psychotic features: Secondary | ICD-10-CM | POA: Diagnosis not present

## 2018-07-01 ENCOUNTER — Telehealth: Payer: Self-pay | Admitting: Family Medicine

## 2018-07-01 NOTE — Telephone Encounter (Signed)
Called patient in regards to a bill she received. Pt states she received a $15 bill from [place]. Pt does not have a bill at our office. Pt did not give permission to leave a VM per dpr.

## 2018-07-05 ENCOUNTER — Encounter: Payer: Self-pay | Admitting: Family Medicine

## 2018-07-05 DIAGNOSIS — F332 Major depressive disorder, recurrent severe without psychotic features: Secondary | ICD-10-CM | POA: Diagnosis not present

## 2018-07-05 DIAGNOSIS — F4312 Post-traumatic stress disorder, chronic: Secondary | ICD-10-CM | POA: Diagnosis not present

## 2018-07-05 DIAGNOSIS — F5101 Primary insomnia: Secondary | ICD-10-CM | POA: Diagnosis not present

## 2018-08-29 DIAGNOSIS — F5101 Primary insomnia: Secondary | ICD-10-CM | POA: Diagnosis not present

## 2018-08-29 DIAGNOSIS — F4312 Post-traumatic stress disorder, chronic: Secondary | ICD-10-CM | POA: Diagnosis not present

## 2018-08-29 DIAGNOSIS — F332 Major depressive disorder, recurrent severe without psychotic features: Secondary | ICD-10-CM | POA: Diagnosis not present

## 2018-09-11 ENCOUNTER — Telehealth: Payer: Self-pay | Admitting: *Deleted

## 2018-09-11 NOTE — Telephone Encounter (Signed)
Schedule AWV.  

## 2018-09-23 DIAGNOSIS — H52223 Regular astigmatism, bilateral: Secondary | ICD-10-CM | POA: Diagnosis not present

## 2018-09-23 DIAGNOSIS — H524 Presbyopia: Secondary | ICD-10-CM | POA: Diagnosis not present

## 2018-10-18 DIAGNOSIS — F5101 Primary insomnia: Secondary | ICD-10-CM | POA: Diagnosis not present

## 2018-10-18 DIAGNOSIS — F4312 Post-traumatic stress disorder, chronic: Secondary | ICD-10-CM | POA: Diagnosis not present

## 2018-10-18 DIAGNOSIS — F332 Major depressive disorder, recurrent severe without psychotic features: Secondary | ICD-10-CM | POA: Diagnosis not present

## 2019-01-18 ENCOUNTER — Encounter: Payer: Self-pay | Admitting: Orthopedic Surgery

## 2019-01-27 ENCOUNTER — Ambulatory Visit: Payer: Medicare HMO | Admitting: Orthopedic Surgery

## 2019-02-12 ENCOUNTER — Other Ambulatory Visit: Payer: Self-pay

## 2019-02-12 DIAGNOSIS — Z20822 Contact with and (suspected) exposure to covid-19: Secondary | ICD-10-CM

## 2019-02-14 LAB — NOVEL CORONAVIRUS, NAA: SARS-CoV-2, NAA: NOT DETECTED

## 2019-02-24 ENCOUNTER — Ambulatory Visit (INDEPENDENT_AMBULATORY_CARE_PROVIDER_SITE_OTHER): Payer: Medicare HMO

## 2019-02-24 ENCOUNTER — Ambulatory Visit (INDEPENDENT_AMBULATORY_CARE_PROVIDER_SITE_OTHER): Payer: Medicare HMO | Admitting: Orthopedic Surgery

## 2019-02-24 ENCOUNTER — Other Ambulatory Visit: Payer: Self-pay

## 2019-02-24 DIAGNOSIS — M79604 Pain in right leg: Secondary | ICD-10-CM

## 2019-02-28 ENCOUNTER — Encounter: Payer: Self-pay | Admitting: Orthopedic Surgery

## 2019-02-28 NOTE — Progress Notes (Signed)
Office Visit Note   Patient: Emily Steele           Date of Birth: June 25, 1955           MRN: [id number] Visit Date: 02/24/2019 Requested by: No referring provider defined for this encounter. PCP: System, Pcp Not In  Subjective: Chief Complaint  Patient presents with  . Right Leg - Pain    HPI: Emily Steele is a patient with right sided buttock pain.  Occasionally has radiating leg pain.  Denies any numbness and tingling and she does not wake from pain.  She has a history of degenerative disc disease and arthritis in her back.  She has had previous treatment for bursitis.  She has a history of spinal fusion in 2015.  Reports pain primarily in the middle of her buttock down the back of her leg but denies any groin pain.              ROS: All systems reviewed are negative as they relate to the chief complaint within the history of present illness.  Patient denies  fevers or chills.   Assessment & Plan: Visit Diagnoses:  1. Pain in right leg     Plan: Impression is mild back pain and posterior buttock pain.  Could be trochanteric issue.  I do not think this is a hip joint issue itself.  Most likely this is coming from the back.  Nonetheless plan MRI pelvis to rule out bursitis or any type of muscular strain pull or tear.  If that is negative then we will proceed with further work-up and referral for spinal evaluation.  Follow-Up Instructions: Return for after MRI.   Orders:  Orders Placed This Encounter  Procedures  . XR Lumbar Spine 2-3 Views  . XR HIP UNILAT W OR W/O PELVIS 2-3 VIEWS RIGHT  . MR Pelvis w/o contrast   No orders of the defined types were placed in this encounter.     Procedures: No procedures performed   Clinical Data: No additional findings.  Objective: Vital Signs: There were no vitals taken for this visit.  Physical Exam:   Constitutional: Patient appears well-developed HEENT:  Head: Normocephalic Eyes:EOM are normal Neck: Normal range of motion  Cardiovascular: Normal rate Pulmonary/chest: Effort normal Neurologic: Patient is alert Skin: Skin is warm Psychiatric: Patient has normal mood and affect    Ortho Exam: Ortho exam demonstrates no nerve root tension signs.  Does have trochanteric tenderness on the right compared to the left.  No groin pain with internal X rotation of the leg.  Reflexes symmetric bilateral patella and Achilles.  No definite paresthesias L1 S1 bilaterally.  Well-healed surgical incision on the back.  Specialty Comments:  No specialty comments available.  Imaging: No results found.   PMFS History: Patient Active Problem List   Diagnosis Date Noted  . Degenerative disc disease, lumbar 10/20/2015  . History of encephalopathy 10/20/2015  . Family history of breast cancer in first degree relative 04/13/2015  . Anemia, iron deficiency 04/13/2015  . Vitamin D deficiency 04/13/2015  . Rosacea, acne 03/16/2015  . Meningioma ([place]) 01/07/2015  . Cognitive impairment 01/07/2015  . Spondylolisthesis at L4-L5 level 08/18/2013  . 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 H/O vitamin D deficiency 08/13/2011  . Major Depressive Disorder 05/17/2011  . Fibromyalgia 05/17/2011  . Hypothyroidism 05/17/2011  . Sleep behavior disorder, REM 05/17/2011  . Osteopenia 05/17/2011  . Obesity 05/17/2011  . Plantar fasciitis 05/17/2011   Past Medical History:  Diagnosis Date  .  Allergy   . Anemia   . Anxiety   . Arthritis   . Brain tumor ([place])    right temporal, not ca  . Constipation   . DDD (degenerative disc disease)   . Depression   . Eczema   . Fibromyalgia   . Fibromyalgia   . GERD (gastroesophageal reflux disease)   . Hearing loss    1 ear - mild  . Herpes simplex   . Hx of migraine headaches   . Hypertension    not on medications  . Hypothyroid   . Obesity   . Osteoarthritis   . Osteopenia   . Osteoporosis   . Overactive bladder   . Plantar fasciitis   . Pneumonia 08/2012  . Rosacea   . Sleep  behavior disorder, REM   . Toenail fungus   . UTI (urinary tract infection)     Family History  Problem Relation Age of Onset  . Cancer Mother 40       lung cancer  . Hypertension Mother   . Cancer Sister        Breast cancer  . Heart disease Sister   . Asthma Sister   . Asthma Brother   . Drug abuse Brother   . Hypertension Brother     Past Surgical History:  Procedure Laterality Date  . ABDOMINAL HYSTERECTOMY     uterine fibroid; ovaries intact. DUB.  Marland Kitchen HAND SURGERY Bilateral    ligimant transfere to thumbs  . LAMINECTOMY  08/18/2013   L 4 L5    DR CRAM  . MAXIMUM ACCESS (MAS)POSTERIOR LUMBAR INTERBODY FUSION (PLIF) 1 LEVEL N/A 08/18/2013   Procedure: FOR MAXIMUM ACCESS (MAS) POSTERIOR LUMBAR INTERBODY FUSION (PLIF) 1 LEVEL;  Surgeon: Elaina Hoops, MD;  Location: [facility];  Service: Neurosurgery;  Laterality: N/A;  FOR MAXIMUM ACCESS (MAS) POSTERIOR LUMBAR INTERBODY FUSION (PLIF) 1 LEVEL L4-5  . SPINE SURGERY     Lamincetomy and Disectomy L5- S1  . TONSILLECTOMY AND ADENOIDECTOMY     Social History   Occupational History    Employer: OTHER    Comment: n/a  Tobacco Use  . Smoking status: Never Smoker  . Smokeless tobacco: Never Used  Substance and Sexual Activity  . Alcohol use: No    Alcohol/week: 0.0 standard drinks  . Drug use: No  . Sexual activity: Never    Birth control/protection: Post-menopausal, Surgical

## 2019-03-17 ENCOUNTER — Encounter: Payer: Self-pay | Admitting: *Deleted

## 2019-03-28 ENCOUNTER — Other Ambulatory Visit: Payer: Medicare HMO

## 2019-04-02 NOTE — H&P (Signed)
Emily Steele is an 63 y.o. female.   Chief Complaint: LEFT WRIST INJURY  HPI: The patient is a 63 y/o right hand dominant female who fell on 03/21/19 and tried to catch herself causing injury to the left wrist. She originally tried to treat it conservatively with rest, ice, elevation, an ace bandage, and antiinflammatories. After it was not improving, she came to our office.  She continued to have swelling, ecchymosis, weakness, stiffness, and pain. We discussed the fracture and the reason and rationale for surgery. She was given a wrist brace and has been using it as if it were a cast.  She is here today for surgery.  She denies chest pain, shortness of breath, fever, chills, nausea, vomiting, or diarrhea.   Past Medical History:  Diagnosis Date  . Allergy   . Anemia   . Anxiety   . Arthritis   . Brain tumor ([place])    right temporal, not ca  . Constipation   . DDD (degenerative disc disease)   . Depression   . Eczema   . Fibromyalgia   . Fibromyalgia   . GERD (gastroesophageal reflux disease)   . Hearing loss    1 ear - mild  . Herpes simplex   . Hx of migraine headaches   . Hypertension    not on medications  . Hypothyroid   . Obesity   . Osteoarthritis   . Osteopenia   . Osteoporosis   . Overactive bladder   . Plantar fasciitis   . Pneumonia 08/2012  . Rosacea   . Sleep behavior disorder, REM   . Toenail fungus   . UTI (urinary tract infection)     Past Surgical History:  Procedure Laterality Date  . ABDOMINAL HYSTERECTOMY     uterine fibroid; ovaries intact. DUB.  Marland Kitchen HAND SURGERY Bilateral    ligimant transfere to thumbs  . LAMINECTOMY  08/18/2013   L 4 L5    DR CRAM  . MAXIMUM ACCESS (MAS)POSTERIOR LUMBAR INTERBODY FUSION (PLIF) 1 LEVEL N/A 08/18/2013   Procedure: FOR MAXIMUM ACCESS (MAS) POSTERIOR LUMBAR INTERBODY FUSION (PLIF) 1 LEVEL;  Surgeon: Emily Hoops, MD;  Location: [facility];  Service: Neurosurgery;  Laterality: N/A;  FOR MAXIMUM ACCESS (MAS)  POSTERIOR LUMBAR INTERBODY FUSION (PLIF) 1 LEVEL L4-5  . SPINE SURGERY     Lamincetomy and Disectomy L5- S1  . TONSILLECTOMY AND ADENOIDECTOMY      Family History  Problem Relation Age of Onset  . Cancer Mother 68       lung cancer  . Hypertension Mother   . Cancer Sister        Breast cancer  . Heart disease Sister   . Asthma Sister   . Asthma Brother   . Drug abuse Brother   . Hypertension Brother    Social History:  reports that she has never smoked. She has never used smokeless tobacco. She reports that she does not drink alcohol or use drugs.  Allergies:  Allergies  Allergen Reactions  . Doxycycline Other (See Comments)    headaches  . Sulfa Antibiotics     Headaches  . Tape Itching  . Temazepam Other (See Comments)    Experiences bad dreams with generic form  . Methocarbamol Nausea Only    headaches    No medications prior to admission.    No results found for this or any previous visit (from the past 48 hour(s)). No results found.  ROS NO RECENT ILLNESSES  OR HOSPITALIZATIONS  There were no vitals taken for this visit. Physical Exam  General Appearance:  Alert, cooperative, no distress, appears stated age  Head:  Normocephalic, without obvious abnormality, atraumatic  Eyes:  Pupils equal, conjunctiva/corneas clear,         Throat: Lips, mucosa, and tongue normal; teeth and gums normal  Neck: No visible masses     Lungs:   respirations unlabored  Chest Wall:  No tenderness or deformity  Heart:  Regular rate and rhythm,  Abdomen:   Soft, non-tender,         Extremities: LUE - SWELLING OF THE LEFT HAND AND WRIST WITH ECCHYMOSIS. NO OPEN WOUNDS OR ERYTHEMA. CAPILLARY REFILL LESS THAN 2 SECONDS. SENSATION INTACT TO LIGHT TOUCH DISTALLY. ABLE TO WIGGLE ALL DIGITS WITH MILD DIFFICULTY. LIMITED MOVEMENT OF THE WRIST IN ALL DIRECTIONS. TENDERNESS TO PALPATION OF THE DISTAL RADIUS.   Pulses: 2+ and symmetric  Skin: Skin color, texture, turgor normal, no rashes  or lesions     Neurologic: Normal    Assessment LEFT WRIST DISTAL RADIUS FRACTURE   Plan LEFT WRIST DISTAL RADIUS OPEN REDUCTION AND INTERNAL FIXATION WITH REPAIR AS INDICATED  R/B/A DISCUSSED WITH PT IN OFFICE.  PT VOICED UNDERSTANDING OF PLAN CONSENT SIGNED DAY OF SURGERY PT SEEN AND EXAMINED PRIOR TO OPERATIVE PROCEDURE/DAY OF SURGERY SITE MARKED. QUESTIONS ANSWERED WILL GO HOME FOLLOWING SURGERY   WE ARE PLANNING SURGERY FOR YOUR UPPER EXTREMITY. THE RISKS AND BENEFITS OF SURGERY INCLUDE BUT NOT LIMITED TO BLEEDING INFECTION, DAMAGE TO NEARBY NERVES ARTERIES TENDONS, FAILURE OF SURGERY TO ACCOMPLISH ITS INTENDED GOALS, PERSISTENT SYMPTOMS AND NEED FOR FURTHER SURGICAL INTERVENTION. WITH THIS IN MIND WE WILL PROCEED. I HAVE DISCUSSED WITH THE PATIENT THE PRE AND POSTOPERATIVE REGIMEN AND THE DOS AND DON'TS. PT VOICED UNDERSTANDING AND INFORMED CONSENT SIGNED.  Emily Planas MD 04/07/19   Emily Steele 04/02/2019, 6:02 PM

## 2019-04-03 ENCOUNTER — Ambulatory Visit: Payer: Medicare HMO | Admitting: Orthopedic Surgery

## 2019-04-03 ENCOUNTER — Other Ambulatory Visit (HOSPITAL_COMMUNITY)
Admission: RE | Admit: 2019-04-03 | Discharge: 2019-04-03 | Disposition: A | Discharge status: Home / Self Care | Payer: Medicare HMO | Source: Ambulatory Visit | Attending: Orthopedic Surgery | Admitting: Orthopedic Surgery

## 2019-04-03 ENCOUNTER — Encounter (HOSPITAL_BASED_OUTPATIENT_CLINIC_OR_DEPARTMENT_OTHER): Payer: Self-pay | Admitting: Orthopedic Surgery

## 2019-04-03 ENCOUNTER — Other Ambulatory Visit: Payer: Self-pay

## 2019-04-03 DIAGNOSIS — Z01812 Encounter for preprocedural laboratory examination: Secondary | ICD-10-CM | POA: Diagnosis present

## 2019-04-03 DIAGNOSIS — Z20828 Contact with and (suspected) exposure to other viral communicable diseases: Secondary | ICD-10-CM | POA: Insufficient documentation

## 2019-04-04 LAB — NOVEL CORONAVIRUS, NAA (HOSP ORDER, SEND-OUT TO REF LAB; TAT 18-24 HRS): SARS-CoV-2, NAA: NOT DETECTED

## 2019-04-07 ENCOUNTER — Other Ambulatory Visit: Payer: Self-pay

## 2019-04-07 ENCOUNTER — Ambulatory Visit (HOSPITAL_BASED_OUTPATIENT_CLINIC_OR_DEPARTMENT_OTHER): Payer: Medicare HMO | Admitting: Anesthesiology

## 2019-04-07 ENCOUNTER — Encounter (HOSPITAL_BASED_OUTPATIENT_CLINIC_OR_DEPARTMENT_OTHER)
Admission: RE | Disposition: A | Discharge status: Home / Self Care | Payer: Self-pay | Source: Home / Self Care | Attending: Orthopedic Surgery

## 2019-04-07 ENCOUNTER — Encounter (HOSPITAL_BASED_OUTPATIENT_CLINIC_OR_DEPARTMENT_OTHER): Payer: Self-pay | Admitting: Orthopedic Surgery

## 2019-04-07 ENCOUNTER — Ambulatory Visit (HOSPITAL_BASED_OUTPATIENT_CLINIC_OR_DEPARTMENT_OTHER)
Admission: RE | Admit: 2019-04-07 | Discharge: 2019-04-08 | Disposition: A | Discharge status: Home / Self Care | Payer: Medicare HMO | Attending: Orthopedic Surgery | Admitting: Orthopedic Surgery

## 2019-04-07 DIAGNOSIS — Z79899 Other long term (current) drug therapy: Secondary | ICD-10-CM | POA: Insufficient documentation

## 2019-04-07 DIAGNOSIS — K219 Gastro-esophageal reflux disease without esophagitis: Secondary | ICD-10-CM | POA: Diagnosis not present

## 2019-04-07 DIAGNOSIS — Z79890 Hormone replacement therapy (postmenopausal): Secondary | ICD-10-CM | POA: Insufficient documentation

## 2019-04-07 DIAGNOSIS — E039 Hypothyroidism, unspecified: Secondary | ICD-10-CM | POA: Diagnosis not present

## 2019-04-07 DIAGNOSIS — X58XXXA Exposure to other specified factors, initial encounter: Secondary | ICD-10-CM | POA: Insufficient documentation

## 2019-04-07 DIAGNOSIS — F329 Major depressive disorder, single episode, unspecified: Secondary | ICD-10-CM | POA: Diagnosis not present

## 2019-04-07 DIAGNOSIS — Z8249 Family history of ischemic heart disease and other diseases of the circulatory system: Secondary | ICD-10-CM | POA: Insufficient documentation

## 2019-04-07 DIAGNOSIS — M810 Age-related osteoporosis without current pathological fracture: Secondary | ICD-10-CM | POA: Diagnosis not present

## 2019-04-07 DIAGNOSIS — E669 Obesity, unspecified: Secondary | ICD-10-CM | POA: Insufficient documentation

## 2019-04-07 DIAGNOSIS — F419 Anxiety disorder, unspecified: Secondary | ICD-10-CM | POA: Diagnosis not present

## 2019-04-07 DIAGNOSIS — Z981 Arthrodesis status: Secondary | ICD-10-CM | POA: Diagnosis not present

## 2019-04-07 DIAGNOSIS — Z6832 Body mass index (BMI) 32.0-32.9, adult: Secondary | ICD-10-CM | POA: Diagnosis not present

## 2019-04-07 DIAGNOSIS — M797 Fibromyalgia: Secondary | ICD-10-CM | POA: Insufficient documentation

## 2019-04-07 DIAGNOSIS — M8580 Other specified disorders of bone density and structure, unspecified site: Secondary | ICD-10-CM | POA: Diagnosis not present

## 2019-04-07 DIAGNOSIS — S52572A Other intraarticular fracture of lower end of left radius, initial encounter for closed fracture: Secondary | ICD-10-CM | POA: Diagnosis present

## 2019-04-07 DIAGNOSIS — M1990 Unspecified osteoarthritis, unspecified site: Secondary | ICD-10-CM | POA: Diagnosis not present

## 2019-04-07 DIAGNOSIS — D649 Anemia, unspecified: Secondary | ICD-10-CM | POA: Diagnosis not present

## 2019-04-07 DIAGNOSIS — I10 Essential (primary) hypertension: Secondary | ICD-10-CM | POA: Diagnosis not present

## 2019-04-07 HISTORY — PX: OPEN REDUCTION INTERNAL FIXATION (ORIF) DISTAL RADIAL FRACTURE: SHX5989

## 2019-04-07 HISTORY — DX: Fracture of unspecified carpal bone, left wrist, initial encounter for closed fracture: S62.102A

## 2019-04-07 SURGERY — OPEN REDUCTION INTERNAL FIXATION (ORIF) DISTAL RADIUS FRACTURE
Anesthesia: Monitor Anesthesia Care | Site: Wrist | Laterality: Left

## 2019-04-07 MED ORDER — CEFAZOLIN SODIUM-DEXTROSE 2-4 GM/100ML-% IV SOLN
INTRAVENOUS | Status: AC
  Filled 2019-04-07: qty 100

## 2019-04-07 MED ORDER — ONDANSETRON HCL 4 MG/2ML IJ SOLN
INTRAMUSCULAR | Status: DC | PRN
  Administered 2019-04-07: 4 mg via INTRAVENOUS

## 2019-04-07 MED ORDER — ACETAMINOPHEN 500 MG PO TABS
500.0000 mg | ORAL_TABLET | Freq: Four times a day (QID) | ORAL | Status: DC
  Administered 2019-04-07: 500 mg via ORAL
  Filled 2019-04-07: qty 1

## 2019-04-07 MED ORDER — FENTANYL CITRATE (PF) 100 MCG/2ML IJ SOLN
INTRAMUSCULAR | Status: AC
  Filled 2019-04-07: qty 2

## 2019-04-07 MED ORDER — CEFAZOLIN SODIUM-DEXTROSE 2-4 GM/100ML-% IV SOLN
2.0000 g | INTRAVENOUS | Status: AC
  Administered 2019-04-07: 2 g via INTRAVENOUS

## 2019-04-07 MED ORDER — ROPIVACAINE HCL 5 MG/ML IJ SOLN
INTRAMUSCULAR | Status: DC | PRN
  Administered 2019-04-07: 30 mL via PERINEURAL

## 2019-04-07 MED ORDER — FENTANYL CITRATE (PF) 100 MCG/2ML IJ SOLN: 25.0000 ug | INTRAMUSCULAR | Status: DC | PRN

## 2019-04-07 MED ORDER — LACTATED RINGERS IV SOLN: INTRAVENOUS | Status: DC

## 2019-04-07 MED ORDER — MIDAZOLAM HCL 2 MG/2ML IJ SOLN
INTRAMUSCULAR | Status: AC
  Filled 2019-04-07: qty 2

## 2019-04-07 MED ORDER — HYDROCODONE-ACETAMINOPHEN 5-325 MG PO TABS
1.0000 | ORAL_TABLET | ORAL | Status: DC | PRN
  Administered 2019-04-08 (×2): 1 via ORAL
  Filled 2019-04-07 (×2): qty 1

## 2019-04-07 MED ORDER — ONDANSETRON HCL 4 MG PO TABS
4.0000 mg | ORAL_TABLET | Freq: Four times a day (QID) | ORAL | Status: DC | PRN
  Filled 2019-04-07: qty 1

## 2019-04-07 MED ORDER — METHOCARBAMOL 500 MG PO TABS: 500.0000 mg | ORAL_TABLET | Freq: Four times a day (QID) | ORAL | Status: DC | PRN

## 2019-04-07 MED ORDER — CEFAZOLIN SODIUM-DEXTROSE 1-4 GM/50ML-% IV SOLN
1.0000 g | Freq: Three times a day (TID) | INTRAVENOUS | Status: DC
  Administered 2019-04-07 – 2019-04-08 (×2): 1 g via INTRAVENOUS
  Filled 2019-04-07 (×2): qty 50

## 2019-04-07 MED ORDER — KCL IN DEXTROSE-NACL 20-5-0.45 MEQ/L-%-% IV SOLN
INTRAVENOUS | Status: DC
  Filled 2019-04-07: qty 1000

## 2019-04-07 MED ORDER — PROPOFOL 500 MG/50ML IV EMUL
INTRAVENOUS | Status: DC | PRN
  Administered 2019-04-07: 75 ug/kg/min via INTRAVENOUS
  Administered 2019-04-07: 40 mg via INTRAVENOUS
  Administered 2019-04-07: 20 mg via INTRAVENOUS

## 2019-04-07 MED ORDER — 0.9 % SODIUM CHLORIDE (POUR BTL) OPTIME
TOPICAL | Status: DC | PRN
  Administered 2019-04-07: 120 mL

## 2019-04-07 MED ORDER — MIDAZOLAM HCL 2 MG/2ML IJ SOLN
1.0000 mg | INTRAMUSCULAR | Status: DC | PRN
  Administered 2019-04-07: 1 mg via INTRAVENOUS

## 2019-04-07 MED ORDER — HYDROCODONE-ACETAMINOPHEN 7.5-325 MG PO TABS: 1.0000 | ORAL_TABLET | ORAL | Status: DC | PRN

## 2019-04-07 MED ORDER — LIDOCAINE HCL (CARDIAC) PF 100 MG/5ML IV SOSY
PREFILLED_SYRINGE | INTRAVENOUS | Status: DC | PRN
  Administered 2019-04-07: 40 mg via INTRAVENOUS

## 2019-04-07 MED ORDER — METHOCARBAMOL 1000 MG/10ML IJ SOLN: 500.0000 mg | Freq: Four times a day (QID) | INTRAVENOUS | Status: DC | PRN

## 2019-04-07 MED ORDER — CEFAZOLIN SODIUM-DEXTROSE 1-4 GM/50ML-% IV SOLN: 1.0000 g | INTRAVENOUS | Status: AC

## 2019-04-07 MED ORDER — FENTANYL CITRATE (PF) 100 MCG/2ML IJ SOLN
50.0000 ug | INTRAMUSCULAR | Status: DC | PRN
  Administered 2019-04-07: 50 ug via INTRAVENOUS

## 2019-04-07 MED ORDER — CHLORHEXIDINE GLUCONATE 4 % EX LIQD: 60.0000 mL | Freq: Once | CUTANEOUS | Status: DC

## 2019-04-07 MED ORDER — DIPHENHYDRAMINE HCL 25 MG PO CAPS: 25.0000 mg | ORAL_CAPSULE | Freq: Four times a day (QID) | ORAL | Status: DC | PRN

## 2019-04-07 MED ORDER — ONDANSETRON HCL 4 MG/2ML IJ SOLN: 4.0000 mg | Freq: Once | INTRAMUSCULAR | Status: DC | PRN

## 2019-04-07 MED ORDER — MEPERIDINE HCL 25 MG/ML IJ SOLN: 6.2500 mg | INTRAMUSCULAR | Status: DC | PRN

## 2019-04-07 MED ORDER — MORPHINE SULFATE (PF) 4 MG/ML IV SOLN
0.5000 mg | INTRAVENOUS | Status: DC | PRN
  Administered 2019-04-08: 1 mg via INTRAVENOUS
  Filled 2019-04-07: qty 1

## 2019-04-07 MED ORDER — ACETAMINOPHEN 325 MG PO TABS: 325.0000 mg | ORAL_TABLET | Freq: Four times a day (QID) | ORAL | Status: DC | PRN

## 2019-04-07 MED ORDER — ONDANSETRON HCL 4 MG/2ML IJ SOLN: 4.0000 mg | Freq: Four times a day (QID) | INTRAMUSCULAR | Status: DC | PRN

## 2019-04-07 SURGICAL SUPPLY — 83 items
BIT DRILL 2.2 SS TIBIAL (BIT) ×2 IMPLANT
BLADE SURG 15 STRL LF DISP TIS (BLADE) ×1 IMPLANT
BLADE SURG 15 STRL SS (BLADE) ×1
BNDG ELASTIC 3X5.8 VLCR STR LF (GAUZE/BANDAGES/DRESSINGS) ×2 IMPLANT
BNDG ELASTIC 4X5.8 VLCR STR LF (GAUZE/BANDAGES/DRESSINGS) ×2 IMPLANT
BNDG ESMARK 4X9 LF (GAUZE/BANDAGES/DRESSINGS) ×2 IMPLANT
BNDG GAUZE ELAST 4 BULKY (GAUZE/BANDAGES/DRESSINGS) ×2 IMPLANT
CANISTER SUCT 1200ML W/VALVE (MISCELLANEOUS) ×2 IMPLANT
CORD BIPOLAR FORCEPS 12FT (ELECTRODE) ×2 IMPLANT
COVER BACK TABLE REUSABLE LG (DRAPES) ×2 IMPLANT
COVER WAND RF STERILE (DRAPES) IMPLANT
CUFF TOURN SGL QUICK 18X4 (TOURNIQUET CUFF) ×2 IMPLANT
DECANTER SPIKE VIAL GLASS SM (MISCELLANEOUS) IMPLANT
DRAPE EXTREMITY T 121X128X90 (DISPOSABLE) ×2 IMPLANT
DRAPE OEC MINIVIEW 54X84 (DRAPES) ×2 IMPLANT
DRSG EMULSION OIL 3X3 NADH (GAUZE/BANDAGES/DRESSINGS) ×2 IMPLANT
GAUZE SPONGE 4X4 12PLY STRL (GAUZE/BANDAGES/DRESSINGS) ×2 IMPLANT
GLOVE BIOGEL M STRL SZ7.5 (GLOVE) ×2 IMPLANT
GLOVE BIOGEL PI IND STRL 6.5 (GLOVE) ×1 IMPLANT
GLOVE BIOGEL PI IND STRL 7.0 (GLOVE) ×1 IMPLANT
GLOVE BIOGEL PI IND STRL 8 (GLOVE) ×1 IMPLANT
GLOVE BIOGEL PI IND STRL 8.5 (GLOVE) ×1 IMPLANT
GLOVE BIOGEL PI INDICATOR 6.5 (GLOVE) ×1
GLOVE BIOGEL PI INDICATOR 7.0 (GLOVE) ×1
GLOVE BIOGEL PI INDICATOR 8 (GLOVE) ×1
GLOVE BIOGEL PI INDICATOR 8.5 (GLOVE) ×1
GLOVE ECLIPSE 6.5 STRL STRAW (GLOVE) ×2 IMPLANT
GLOVE SURG ORTHO 8.0 STRL STRW (GLOVE) ×2 IMPLANT
GOWN STRL REUS W/ TWL XL LVL3 (GOWN DISPOSABLE) ×2 IMPLANT
GOWN STRL REUS W/TWL XL LVL3 (GOWN DISPOSABLE) ×2
K-WIRE 1.6 (WIRE) ×2
K-WIRE FX5X1.6XNS BN SS (WIRE) ×2
KWIRE FX5X1.6XNS BN SS (WIRE) ×2 IMPLANT
NEEDLE HYPO 25X1 1.5 SAFETY (NEEDLE) IMPLANT
NS IRRIG 1000ML POUR BTL (IV SOLUTION) ×2 IMPLANT
PACK BASIN DAY SURGERY FS (CUSTOM PROCEDURE TRAY) ×2 IMPLANT
PAD CAST 3X4 CTTN HI CHSV (CAST SUPPLIES) ×1 IMPLANT
PAD CAST 4YDX4 CTTN HI CHSV (CAST SUPPLIES) ×1 IMPLANT
PADDING CAST ABS 4INX4YD NS (CAST SUPPLIES)
PADDING CAST ABS COTTON 4X4 ST (CAST SUPPLIES) IMPLANT
PADDING CAST COTTON 3X4 STRL (CAST SUPPLIES) ×1
PADDING CAST COTTON 4X4 STRL (CAST SUPPLIES) ×1
PEG LOCKING SMOOTH 2.2X16 (Screw) ×2 IMPLANT
PEG LOCKING SMOOTH 2.2X18 (Peg) ×4 IMPLANT
PEG LOCKING SMOOTH 2.2X22 (Screw) ×2 IMPLANT
PLATE NARROW DVR LEFT (Plate) ×2 IMPLANT
SCREW LOCK 12X2.7X 3 LD (Screw) ×2 IMPLANT
SCREW LOCK 14X2.7X 3 LD TPR (Screw) ×2 IMPLANT
SCREW LOCK 16X2.7X 3 LD TPR (Screw) ×1 IMPLANT
SCREW LOCKING 2.7X12MM (Screw) ×2 IMPLANT
SCREW LOCKING 2.7X13MM (Screw) ×2 IMPLANT
SCREW LOCKING 2.7X14 (Screw) ×2 IMPLANT
SCREW LOCKING 2.7X16 (Screw) ×1 IMPLANT
SCREW LP NL 2.7X22MM (Screw) ×2 IMPLANT
SCREW MULTI DIRECTIONAL 2.7X15 (Screw) ×2 IMPLANT
SCREW MULTI DIRECTIONAL 2.7X16 (Screw) ×4 IMPLANT
SCREW MULTI DIRECTIONAL 2.7X22 (Screw) ×2 IMPLANT
SCREW NLOCK 24X2.7 3 LD (Screw) ×1 IMPLANT
SCREW NONLOCK 2.7X24 (Screw) ×1 IMPLANT
SLEEVE SCD COMPRESS KNEE MED (MISCELLANEOUS) ×2 IMPLANT
SLING ARM FOAM STRAP LRG (SOFTGOODS) IMPLANT
SLING ARM FOAM STRAP MED (SOFTGOODS) ×2 IMPLANT
SPLINT FIBERGLASS 3X35 (CAST SUPPLIES) ×2 IMPLANT
SPLINT FIBERGLASS 4X30 (CAST SUPPLIES) IMPLANT
STOCKINETTE 4X48 STRL (DRAPES) ×2 IMPLANT
SUCTION FRAZIER HANDLE 10FR (MISCELLANEOUS) ×1
SUCTION TUBE FRAZIER 10FR DISP (MISCELLANEOUS) ×1 IMPLANT
SUT MNCRL AB 3-0 PS2 18 (SUTURE) IMPLANT
SUT MON AB 3-0 SH 27 (SUTURE)
SUT MON AB 3-0 SH27 (SUTURE) IMPLANT
SUT PROLENE 3 0 PS 1 (SUTURE) IMPLANT
SUT PROLENE 4 0 PS 2 18 (SUTURE) ×2 IMPLANT
SUT VIC AB 0 CT1 27 (SUTURE)
SUT VIC AB 0 CT1 27XBRD ANBCTR (SUTURE) IMPLANT
SUT VIC AB 2-0 PS2 27 (SUTURE) ×2 IMPLANT
SUT VIC AB 2-0 SH 27 (SUTURE)
SUT VIC AB 2-0 SH 27XBRD (SUTURE) IMPLANT
SUT VICRYL 4-0 PS2 18IN ABS (SUTURE) ×2 IMPLANT
SYR BULB 3OZ (MISCELLANEOUS) ×2 IMPLANT
SYR CONTROL 10ML LL (SYRINGE) IMPLANT
TOWEL GREEN STERILE FF (TOWEL DISPOSABLE) ×2 IMPLANT
TUBE CONNECTING 20X1/4 (TUBING) ×2 IMPLANT
UNDERPAD 30X36 HEAVY ABSORB (UNDERPADS AND DIAPERS) ×2 IMPLANT

## 2019-04-07 NOTE — Discharge Instructions (Signed)
KEEP BANDAGE CLEAN AND DRY CALL OFFICE FOR F/U APPT [phone] IN 14 DAYS RX SENT TO [facility] ON [address] KEEP HAND ELEVATED ABOVE HEART OK TO APPLY ICE TO OPERATIVE AREA CONTACT OFFICE IF ANY WORSENING PAIN OR CONCERNS.

## 2019-04-07 NOTE — Anesthesia Procedure Notes (Signed)
Anesthesia Regional Block: Supraclavicular block   Pre-Anesthetic Checklist: ,, timeout performed, Correct Patient, Correct Site, Correct Laterality, Correct Procedure, Correct Position, site marked, Risks and benefits discussed,  Surgical consent,  Pre-op evaluation,  At surgeon's request and post-op pain management  Laterality: Left  Prep: chloraprep       Needles:  Injection technique: Single-shot  Needle Type: Echogenic Stimulator Needle     Needle Length: 9cm  Needle Gauge: 21   Needle insertion depth: 5 cm   Additional Needles:   Procedures:,,,, ultrasound used (permanent image in chart),,,,  Narrative:  Start time: 04/07/2019 1:30 PM End time: 04/07/2019 1:35 PM Injection made incrementally with aspirations every 5 mL.  Performed by: Personally  Anesthesiologist: Josephine Igo, MD  Additional Notes: Timeout performed. Patient sedated. Relevant anatomy ID'd using [place]. Incremental 2-43ml injection of LA with frequent aspiration. Patient tolerated procedure well.        Left Supraclavicular Block

## 2019-04-07 NOTE — Anesthesia Preprocedure Evaluation (Addendum)
Anesthesia Evaluation  Patient identified by MRN, date of birth, ID band Patient awake    Reviewed: Allergy & Precautions, NPO status , Patient's Chart, lab work & pertinent test results  Airway Mallampati: II  TM Distance: >3 FB Neck ROM: Full    Dental no notable dental hx. (+) Teeth Intact   Pulmonary pneumonia, resolved,    Pulmonary exam normal breath sounds clear to auscultation       Cardiovascular hypertension, Pt. on medications Normal cardiovascular exam Rhythm:Regular Rate:Normal     Neuro/Psych PSYCHIATRIC DISORDERS Anxiety Depression Mild cognitive impairmentHx/o HSV meningitis  Neuromuscular disease    GI/Hepatic negative GI ROS, Neg liver ROS,   Endo/Other  Hypothyroidism Obesity  Renal/GU negative Renal ROS  negative genitourinary   Musculoskeletal  (+) Arthritis , Osteoarthritis,  Fibromyalgia -Fx right distal radius   Abdominal (+) + obese,   Peds  Hematology  (+) anemia ,   Anesthesia Other Findings   Reproductive/Obstetrics                             Anesthesia Physical Anesthesia Plan  ASA: III  Anesthesia Plan: MAC and Regional   Post-op Pain Management:  Regional for Post-op pain   Induction: Intravenous  PONV Risk Score and Plan: 3  Airway Management Planned: Natural Airway, Nasal Cannula and Simple Face Mask  Additional Equipment:   Intra-op Plan:   Post-operative Plan:   Informed Consent: I have reviewed the patients History and Physical, chart, labs and discussed the procedure including the risks, benefits and alternatives for the proposed anesthesia with the patient or authorized representative who has indicated his/her understanding and acceptance.     Dental advisory given  Plan Discussed with: CRNA and Surgeon  Anesthesia Plan Comments:        Anesthesia Quick Evaluation

## 2019-04-07 NOTE — Anesthesia Postprocedure Evaluation (Signed)
Anesthesia Post Note  Patient: Emily Steele  Procedure(s) Performed: OPEN REDUCTION INTERNAL FIXATION (ORIF) DISTAL RADIAL FRACTURE (Left Wrist)     Patient location during evaluation: PACU Anesthesia Type: Regional and MAC Level of consciousness: awake and alert and oriented Pain management: pain level controlled Vital Signs Assessment: post-procedure vital signs reviewed and stable Respiratory status: spontaneous breathing, nonlabored ventilation and respiratory function stable Cardiovascular status: stable and blood pressure returned to baseline Postop Assessment: no apparent nausea or vomiting Anesthetic complications: no    Last Vitals:  Vitals:   04/07/19 1600 04/07/19 1615  BP: (!) 118/57 134/75  Pulse: 67 61  Resp: 19 (!) 23  Temp:    SpO2: 98% 96%    Last Pain:  Vitals:   04/07/19 1600  TempSrc:   PainSc: 0-No pain                 Jasmarie Coppock A.

## 2019-04-07 NOTE — Progress Notes (Signed)
Assisted Dr. Foster with left, ultrasound guided, supraclavicular block. Side rails up, monitors on throughout procedure. See vital signs in flow sheet. Tolerated Procedure well. ?

## 2019-04-07 NOTE — Op Note (Signed)
PREOPERATIVE DIAGNOSIS: Left wrist intra-articular distal radius fracture three more fragments  POSTOPERATIVE DIAGNOSIS: Same  ATTENDING SURGEON: Dr. Iran Planas who scrubbed and present for the entire procedure  ASSISTANT SURGEON: Gertie Fey, PA-C was scrubbed and necessary for aid in reduction application of the internal fixation closure splinting in a timely fashion  ANESTHESIA: Regional with IV sedation  OPERATIVE PROCEDURE: #1: Open treatment of left wrist intra-articular distal radius fracture three more fragments #2: Left wrist brachial radialis tendon release, tendon tenotomy #3: Radiographs three views left wrist  IMPLANTS: [employer] DVR narrow cross lock  RADIOGRAPHIC INTERPRETATION: AP lateral oblique views of the wrist do show the volar plate fixation place with good restoration of the length in neutral alignment on the lateral.  SURGICAL INDICATIONS: Patient is a right-hand-dominant female who sustained the intra-articular distal radius fracture several weeks ago.  Patient was seen evaluate in the office and given the displacement and angulation is recommended that she undergo the above procedure.  The risks of surgery include but not limited to bleeding infection damage nearby nerves arteries or tendons loss of motion of the wrist and digits incomplete relief of symptoms and need for further surgical intervention.  SURGICAL TECHNIQUE: Patient was palpated via the preoperative holding area marked apart a marker made on left wrist indicate the correct operative site.  Patient brought back operating placed supine on anesthesia table where the regional anesthetic had been administered.  Patient tolerated this well.  A well-padded tourniquet was then placed in the left brachium seal with the appropriate drape.  Left upper extremities then prepped and draped in normal sterile fashion.  A timeout was called the correct site was identified the procedure then begun.  Several centimeter  incision made directly over the FCR sheath.  Dissection carried down through the skin and subcutaneous tissue.  The FCR sheath was opened proximally distally.  Going through the floor the FCR sheath the FPL was then swept all the way the pronator quadratus was then elevated in an L-shaped fashion.  In order to reduce the radial column of the intra-articular fracture the brachial radialis was then carefully released and tendon tenotomy was done of the brachial radialis.  Once this was carried out in a separate fashion attention was then turned to stabilization of the fracture.  Takedown of the nascent malunion was then carried out.  This was done with small osteotomes rongeurs and instrumentation to recreate the fracture line.  Once this was carried out because the fracture was several weeks in age the osteoclasis was able to be performed.  Following this the volar plate was then applied distally.  It was held in place with distal screws and then fixed to the shaft proximally in order to try to restore the volar tilt.  After this was carried out an oblong screw hole was then used proximally.  Plate height was then adjusted for length.  The radius was brought out to what appeared to be of reasonable length.  Following this fixation was then carried out distally with a combination multidirectional screws.  These were confirmed using the mini C arm.  After screws were then placed distally shaft fixation was then carried out combination of locking and nonlocking screws.  Final radiographs were then obtained.  The wound was then thoroughly irrigated.  Pronator quadratus was closed with 2-0 Vicryl.  Subcutaneous tissues closed with 4-0 Vicryl and skin closed with simple 4-0 Prolene.  Adaptic dressing a sterile compressive bandage then applied the patient then placed  in a long-arm volar splint taken recovery in good condition.  POSTOPERATIVE PLAN: Patient be admitted overnight for IV antibiotics and pain control.   Discharged in the morning.  See him back the office in 2 weeks for wound check suture removal x-rays out of the splint application of a short arm cast begin a therapy regimen the 4-week mark.  Radiographs at each visit.  Placed the therapy order the first postoperative visit.

## 2019-04-07 NOTE — Transfer of Care (Signed)
Immediate Anesthesia Transfer of Care Note  Patient: Emily Steele  Procedure(s) Performed: OPEN REDUCTION INTERNAL FIXATION (ORIF) DISTAL RADIAL FRACTURE (Left Wrist)  Patient Location: PACU  Anesthesia Type:MAC combined with regional for post-op pain  Level of Consciousness: awake, alert , oriented and patient cooperative  Airway & Oxygen Therapy: Patient Spontanous Breathing and Patient connected to nasal cannula oxygen  Post-op Assessment: Report given to RN and Post -op Vital signs reviewed and stable  Post vital signs: Reviewed and stable  Last Vitals:  Vitals Value Taken Time  BP    Temp    Pulse    Resp    SpO2      Last Pain:  Vitals:   04/07/19 1259  TempSrc: Tympanic  PainSc: 2       Patients Stated Pain Goal: 5 (09/73/53 2992)  Complications: No apparent anesthesia complications

## 2019-04-08 DIAGNOSIS — S52572A Other intraarticular fracture of lower end of left radius, initial encounter for closed fracture: Secondary | ICD-10-CM | POA: Diagnosis not present

## 2019-04-08 MED ORDER — CYCLOBENZAPRINE HCL 10 MG PO TABS
10.0000 mg | ORAL_TABLET | Freq: Three times a day (TID) | ORAL | Status: DC | PRN
  Filled 2019-04-08: qty 1

## 2019-04-08 MED ORDER — OXYCODONE-ACETAMINOPHEN 5-325 MG PO TABS
ORAL_TABLET | ORAL | Status: AC
  Filled 2019-04-08: qty 1

## 2019-04-08 MED ORDER — OXYCODONE-ACETAMINOPHEN 5-325 MG PO TABS
1.0000 | ORAL_TABLET | ORAL | Status: DC | PRN
  Administered 2019-04-08 (×2): 1 via ORAL
  Filled 2019-04-08: qty 1

## 2019-04-08 MED ORDER — CYCLOBENZAPRINE HCL 5 MG PO TABS
5.0000 mg | ORAL_TABLET | Freq: Three times a day (TID) | ORAL | Status: DC | PRN
  Administered 2019-04-08: 5 mg via ORAL

## 2019-04-23 ENCOUNTER — Telehealth: Payer: Self-pay | Admitting: Family Medicine

## 2019-04-23 NOTE — Telephone Encounter (Signed)
Pt states she needs a referral backdated to 04/17/2018 for her psychiatrist Dr Lulu Riding [facility] Fax [phone]   Pt did not see a dr with [facility] in 2020.  Her pcp was [facility] Pt changed insurance companies in 2020 to [facility]  ID:   [id number]  Pt states she was referred by Steffanie Dunn in 2019.  When she changed insurance companies, she did not realize she needed a new referral.  So her dr has not been paid all of last year. Advised pt I would send a message, but unsure if another dr will put in referral.

## 2019-04-28 NOTE — Telephone Encounter (Signed)
Patient was advised that we can not back date her records and she have not been seen here in 2 years. Patient stated she understood and she what else she will need to do.

## 2019-04-29 ENCOUNTER — Other Ambulatory Visit: Payer: Self-pay

## 2019-04-29 ENCOUNTER — Ambulatory Visit
Admission: RE | Admit: 2019-04-29 | Discharge: 2019-04-29 | Disposition: A | Discharge status: Home / Self Care | Payer: Medicare Other | Source: Ambulatory Visit | Attending: Orthopedic Surgery | Admitting: Orthopedic Surgery

## 2019-04-29 DIAGNOSIS — M79604 Pain in right leg: Secondary | ICD-10-CM

## 2019-05-07 ENCOUNTER — Ambulatory Visit (INDEPENDENT_AMBULATORY_CARE_PROVIDER_SITE_OTHER): Payer: Medicare Other | Admitting: Orthopedic Surgery

## 2019-05-07 ENCOUNTER — Encounter: Payer: Self-pay | Admitting: Orthopedic Surgery

## 2019-05-07 ENCOUNTER — Other Ambulatory Visit: Payer: Self-pay

## 2019-05-07 VITALS — Ht 59.0 in | Wt 160.0 lb

## 2019-05-07 DIAGNOSIS — M533 Sacrococcygeal disorders, not elsewhere classified: Secondary | ICD-10-CM

## 2019-05-07 DIAGNOSIS — Z9889 Other specified postprocedural states: Secondary | ICD-10-CM

## 2019-05-07 DIAGNOSIS — M549 Dorsalgia, unspecified: Secondary | ICD-10-CM | POA: Diagnosis not present

## 2019-05-08 ENCOUNTER — Encounter: Payer: Self-pay | Admitting: *Deleted

## 2019-05-09 ENCOUNTER — Encounter: Payer: Self-pay | Admitting: Orthopedic Surgery

## 2019-05-09 NOTE — Progress Notes (Signed)
Office Visit Note   Patient: Emily Steele           Date of Birth: [birth year]           MRN: [id number] Visit Date: 05/07/2019 Requested by: Nickola Major, MD [id number] [place],  [place] [id number] PCP: Nickola Major, MD  Subjective: Chief Complaint  Patient presents with  . Pelvis - Follow-up, Pain    MRI Pelvis Review     HPI: Emily Steele is a 64 y.o. female who presents to the office complaining of right buttocks pain.  Patient returns to discuss MRI results.  She notes no change in symptoms since her last office visit.  She describes right buttocks pain that radiates down the back of her leg.  This pain wakes her up at night and is worse when sitting.  She does not have any issues bending over.  Patient denies any axial lumbar spine pain or groin pain.  She has a history of weakness in her legs.  She has had 2 back surgeries, and L5-S1 laminectomy in 2003 and an L4-L5 fusion in 2014.  She takes occasional Tylenol and Advil for her symptoms.  She requests physical therapy with trigger point therapy specifically.  MRI of the pelvis revealed mild bilateral hip joint degenerative changes without stress fracture/AVN, chronically fused SI joints, left greater than the right, mild bilateral peritendinitis without trochanteric bursitis, chronic lumbar fusion changes..                ROS:  All systems reviewed are negative as they relate to the chief complaint within the history of present illness.  Patient denies fevers or chills.  Assessment & Plan: Visit Diagnoses:  1. Back pain with history of spinal surgery   2. SI (sacroiliac) joint dysfunction     Plan: Patient is a 64 year old female who presents complaining of right buttocks pain that radiates down the back of her leg.  She has a history of back surgeries with the last one being in 2014.  Her symptoms have not changed since her last office visit and her MRI has revealed findings as noted in the HPI.  Patient is  taking occasional Tylenol and Advil for her pain but this is not providing her significant relief.  We discussed options available to the patient.  She wants to avoid any sort of cortisone injections for now.  She wants to try physical therapy and trigger point therapy as well.  Provided prescription for physical therapy with trigger point therapy to patient and she will follow-up with the office as needed.  Follow-Up Instructions: No follow-ups on file.   Orders:  Orders Placed This Encounter  Procedures  . Ambulatory referral to Physical Therapy   No orders of the defined types were placed in this encounter.     Procedures: No procedures performed   Clinical Data: No additional findings.  Objective: Vital Signs: Ht 4\' 11"  (1.499 m)   Wt 160 lb (72.6 kg)   BMI 32.32 kg/m   Physical Exam:  Constitutional: Patient appears well-developed HEENT:  Head: Normocephalic Eyes:EOM are normal Neck: Normal range of motion Cardiovascular: Normal rate Pulmonary/chest: Effort normal Neurologic: Patient is alert Skin: Skin is warm Psychiatric: Patient has normal mood and affect  Ortho Exam:  Tender to palpation over the right SI joint.  Nontender over the left.  No significant tenderness throughout the axial lumbar spine or paraspinal musculature.  Negative straight leg raise bilaterally.  Mild tenderness palpation over the trochanteric bursa on the right.  No pain with internal rotation/external rotation of bilateral hip joints.  Specialty Comments:  No specialty comments available.  Imaging: No results found.   PMFS History: Patient Active Problem List   Diagnosis Date Noted  . Degenerative disc disease, lumbar 10/20/2015  . History of encephalopathy 10/20/2015  . Family history of breast cancer in first degree relative 04/13/2015  . Anemia, iron deficiency 04/13/2015  . Vitamin D deficiency 04/13/2015  . Rosacea, acne 03/16/2015  . Meningioma ([place]) 01/07/2015  .  Cognitive impairment 01/07/2015  . Spondylolisthesis at L4-L5 level 08/18/2013  . 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 H/O vitamin D deficiency 08/13/2011  . Major Depressive Disorder 05/17/2011  . Fibromyalgia 05/17/2011  . Hypothyroidism 05/17/2011  . Sleep behavior disorder, REM 05/17/2011  . Osteopenia 05/17/2011  . Obesity 05/17/2011  . Plantar fasciitis 05/17/2011   Past Medical History:  Diagnosis Date  . Allergy   . Anemia   . Anxiety   . Arthritis   . Brain tumor ([place])    right temporal, not ca  . Constipation   . DDD (degenerative disc disease)   . Depression   . Eczema   . Fibromyalgia   . Fibromyalgia   . Hearing loss    1 ear - mild  . Herpes simplex   . Hx of migraine headaches   . Hypertension    not on medications  . Hypothyroid   . Left wrist fracture   . Obesity   . Osteoarthritis   . Osteopenia   . Osteoporosis   . Overactive bladder   . Plantar fasciitis   . Pneumonia 08/2012  . Rosacea   . Sleep behavior disorder, REM   . Toenail fungus   . UTI (urinary tract infection)     Family History  Problem Relation Age of Onset  . Cancer Mother 23       lung cancer  . Hypertension Mother   . Cancer Sister        Breast cancer  . Heart disease Sister   . Asthma Sister   . Asthma Brother   . Drug abuse Brother   . Hypertension Brother     Past Surgical History:  Procedure Laterality Date  . ABDOMINAL HYSTERECTOMY     uterine fibroid; ovaries intact. DUB.  Marland Kitchen HAND SURGERY Bilateral    ligimant transfere to thumbs  . LAMINECTOMY  08/18/2013   L 4 L5    DR CRAM  . MAXIMUM ACCESS (MAS)POSTERIOR LUMBAR INTERBODY FUSION (PLIF) 1 LEVEL N/A 08/18/2013   Procedure: FOR MAXIMUM ACCESS (MAS) POSTERIOR LUMBAR INTERBODY FUSION (PLIF) 1 LEVEL;  Surgeon: Elaina Hoops, MD;  Location: [facility];  Service: Neurosurgery;  Laterality: N/A;  FOR MAXIMUM ACCESS (MAS) POSTERIOR LUMBAR INTERBODY FUSION (PLIF) 1 LEVEL L4-5  . OPEN REDUCTION INTERNAL FIXATION (ORIF)  DISTAL RADIAL FRACTURE Left 04/07/2019   Procedure: OPEN REDUCTION INTERNAL FIXATION (ORIF) DISTAL RADIAL FRACTURE;  Surgeon: Iran Planas, MD;  Location: [facility];  Service: Orthopedics;  Laterality: Left;  with IV sedation  . SPINE SURGERY     Lamincetomy and Disectomy L5- S1  . TONSILLECTOMY AND ADENOIDECTOMY     Social History   Occupational History    Employer: OTHER    Comment: n/a  Tobacco Use  . Smoking status: Never Smoker  . Smokeless tobacco: Never Used  Substance and Sexual Activity  . Alcohol use: Yes    Alcohol/week: 0.0  standard drinks    Comment: 2-3 weekly  . Drug use: No  . Sexual activity: Never    Birth control/protection: Post-menopausal, Surgical

## 2019-07-24 ENCOUNTER — Ambulatory Visit: Payer: Medicare Other | Attending: Internal Medicine

## 2019-07-24 DIAGNOSIS — Z23 Encounter for immunization: Secondary | ICD-10-CM

## 2019-07-24 NOTE — Progress Notes (Signed)
   Covid-19 Vaccination Clinic  Name:  BETHENY CRAHAN    MRN: [id number] DOB: Jan 25, 1956  07/24/2019  Ms. Siddiqi was observed post Covid-19 immunization for 15 minutes without incident. She was provided with Vaccine Information Sheet and instruction to access the V-Safe system.   Ms. Heidel was instructed to call 911 with any severe reactions post vaccine: Marland Kitchen Difficulty breathing  . Swelling of face and throat  . A fast heartbeat  . A bad rash all over body  . Dizziness and weakness   Immunizations Administered    Name Date Dose VIS Date Route   Pfizer COVID-19 Vaccine 07/24/2019  1:32 PM 0.3 mL 03/28/2019 Intramuscular   Manufacturer: [place]   Lot: [id number]   [place]: [id number]

## 2019-08-18 ENCOUNTER — Ambulatory Visit: Payer: Medicare Other | Attending: Internal Medicine

## 2019-08-18 DIAGNOSIS — Z23 Encounter for immunization: Secondary | ICD-10-CM

## 2019-08-18 NOTE — Progress Notes (Signed)
   Covid-19 Vaccination Clinic  Name:  Emily Steele    MRN: [id number] DOB: 1955-05-17  08/18/2019  Emily Steele was observed post Covid-19 immunization for 15 minutes without incident. She was provided with Vaccine Information Sheet and instruction to access the V-Safe system.   Emily Steele was instructed to call 911 with any severe reactions post vaccine: Marland Kitchen Difficulty breathing  . Swelling of face and throat  . A fast heartbeat  . A bad rash all over body  . Dizziness and weakness   Immunizations Administered    Name Date Dose VIS Date Route   Pfizer COVID-19 Vaccine 08/18/2019  1:18 PM 0.3 mL 06/11/2018 Intramuscular   Manufacturer: [place]   Lot: [id number]   [place]: [id number]

## 2019-09-14 ENCOUNTER — Other Ambulatory Visit: Payer: Self-pay

## 2019-09-14 ENCOUNTER — Emergency Department (HOSPITAL_COMMUNITY): Payer: Medicare Other

## 2019-09-14 ENCOUNTER — Encounter (HOSPITAL_COMMUNITY): Payer: Self-pay | Admitting: Emergency Medicine

## 2019-09-14 ENCOUNTER — Emergency Department (HOSPITAL_COMMUNITY)
Admission: EM | Admit: 2019-09-14 | Discharge: 2019-09-15 | Disposition: A | Discharge status: Home / Self Care | Payer: Medicare Other | Attending: Emergency Medicine | Admitting: Emergency Medicine

## 2019-09-14 DIAGNOSIS — I10 Essential (primary) hypertension: Secondary | ICD-10-CM | POA: Insufficient documentation

## 2019-09-14 DIAGNOSIS — N201 Calculus of ureter: Secondary | ICD-10-CM

## 2019-09-14 DIAGNOSIS — Z79899 Other long term (current) drug therapy: Secondary | ICD-10-CM | POA: Insufficient documentation

## 2019-09-14 DIAGNOSIS — R1032 Left lower quadrant pain: Secondary | ICD-10-CM | POA: Diagnosis present

## 2019-09-14 LAB — COMPREHENSIVE METABOLIC PANEL
ALT: 25 U/L (ref 0–44)
AST: 28 U/L (ref 15–41)
Albumin: 4.1 g/dL (ref 3.5–5.0)
Alkaline Phosphatase: 73 U/L (ref 38–126)
Anion gap: 13 (ref 5–15)
BUN: 19 mg/dL (ref 8–23)
CO2: 22 mmol/L (ref 22–32)
Calcium: 9.6 mg/dL (ref 8.9–10.3)
Chloride: 106 mmol/L (ref 98–111)
Creatinine, Ser: 0.84 mg/dL (ref 0.44–1.00)
GFR calc Af Amer: >60 mL/min (ref >60)
GFR calc non Af Amer: >60 mL/min (ref >60)
Glucose, Bld: 125 mg/dL — ABNORMAL HIGH (ref 70–99)
Potassium: 4 mmol/L (ref 3.5–5.1)
Sodium: 141 mmol/L (ref 135–145)
Total Bilirubin: 0.8 mg/dL (ref 0.3–1.2)
Total Protein: 6.4 g/dL — ABNORMAL LOW (ref 6.5–8.1)

## 2019-09-14 LAB — CBC
HCT: 46.3 % — ABNORMAL HIGH (ref 36.0–46.0)
Hemoglobin: 15.1 g/dL — ABNORMAL HIGH (ref 12.0–15.0)
MCH: 28.5 pg (ref 26.0–34.0)
MCHC: 32.6 g/dL (ref 30.0–36.0)
MCV: 87.4 fL (ref 80.0–100.0)
Platelets: 176 10*3/uL (ref 150–400)
RBC: 5.3 MIL/uL — ABNORMAL HIGH (ref 3.87–5.11)
RDW: 13.3 % (ref 11.5–15.5)
WBC: 5.4 10*3/uL (ref 4.0–10.5)
nRBC: 0 % (ref 0.0–0.2)

## 2019-09-14 LAB — LIPASE, BLOOD: Lipase: 103 U/L — ABNORMAL HIGH (ref 11–51)

## 2019-09-14 MED ORDER — IBUPROFEN 400 MG PO TABS
400.0000 mg | ORAL_TABLET | Freq: Once | ORAL | Status: AC | PRN
  Administered 2019-09-14: 400 mg via ORAL

## 2019-09-14 MED ORDER — SODIUM CHLORIDE 0.9% FLUSH: 3.0000 mL | Freq: Once | INTRAVENOUS | Status: DC

## 2019-09-14 NOTE — ED Triage Notes (Signed)
Pt brought to ED by [facility] from home for c/o LUQ abd pain, no nausea or vomiting. Last BM today.

## 2019-09-14 NOTE — ED Notes (Signed)
Pt refuses to have Ibuprofen while in triage.

## 2019-09-15 ENCOUNTER — Emergency Department (HOSPITAL_COMMUNITY): Payer: Medicare Other

## 2019-09-15 LAB — URINALYSIS, ROUTINE W REFLEX MICROSCOPIC
Bilirubin Urine: NEGATIVE
Glucose, UA: NEGATIVE mg/dL
Hgb urine dipstick: NEGATIVE
Ketones, ur: 5 mg/dL — AB
Leukocytes,Ua: NEGATIVE
Nitrite: NEGATIVE
Protein, ur: NEGATIVE mg/dL
Specific Gravity, Urine: >1.046 — ABNORMAL HIGH (ref 1.005–1.030)
pH: 7 (ref 5.0–8.0)

## 2019-09-15 MED ORDER — SODIUM CHLORIDE 0.9 % IV BOLUS (SEPSIS)
1000.0000 mL | Freq: Once | INTRAVENOUS | Status: AC
  Administered 2019-09-15: 1000 mL via INTRAVENOUS

## 2019-09-15 MED ORDER — SODIUM CHLORIDE 0.9 % IV SOLN
1000.0000 mL | INTRAVENOUS | Status: DC
  Administered 2019-09-15: 1000 mL via INTRAVENOUS

## 2019-09-15 MED ORDER — IOHEXOL 300 MG/ML  SOLN
100.0000 mL | Freq: Once | INTRAMUSCULAR | Status: AC | PRN
  Administered 2019-09-15: 100 mL via INTRAVENOUS

## 2019-09-15 MED ORDER — KETOROLAC TROMETHAMINE 15 MG/ML IJ SOLN
15.0000 mg | Freq: Once | INTRAMUSCULAR | Status: AC
  Administered 2019-09-15: 15 mg via INTRAVENOUS
  Filled 2019-09-15: qty 1

## 2019-09-15 MED ORDER — IBUPROFEN 600 MG PO TABS
600.0000 mg | ORAL_TABLET | Freq: Four times a day (QID) | ORAL | 0 refills | Status: AC | PRN
Start: 2019-09-15 — End: ?

## 2019-09-15 MED ORDER — HYDROMORPHONE HCL 1 MG/ML IJ SOLN
0.5000 mg | Freq: Once | INTRAMUSCULAR | Status: AC
  Administered 2019-09-15: 0.5 mg via INTRAVENOUS
  Filled 2019-09-15: qty 1

## 2019-09-15 MED ORDER — TAMSULOSIN HCL 0.4 MG PO CAPS: 0.4000 mg | ORAL_CAPSULE | Freq: Every day | ORAL | 0 refills | Status: AC

## 2019-09-15 NOTE — ED Provider Notes (Signed)
[facility] EMERGENCY DEPARTMENT Provider Note  CSN: [id number] Arrival date & time: 09/14/19 2229  Chief Complaint(s) Abdominal Pain  HPI Emily Steele is a 64 y.o. female   HPI CC: flank pain  Onset/Duration: 5 hrs, sudden Timing: constant Location: left Quality: aching Severity: moderate to severe Modifying Factors:  Improved by: nothing  Worsened by: movement Associated Signs/Symptoms:  Pertinent (+): belching, constipation,   Pertinent (-): n/v/d, chest pain, sob, fever, chills, dysuria, hematuria Context:   Currently taking medicine for toe fungus.   Past Medical History Past Medical History:  Diagnosis Date  . Allergy   . Anemia   . Anxiety   . Arthritis   . Brain tumor ([place])    right temporal, not ca  . Constipation   . DDD (degenerative disc disease)   . Depression   . Eczema   . Fibromyalgia   . Fibromyalgia   . Hearing loss    1 ear - mild  . Herpes simplex   . Hx of migraine headaches   . Hypertension    not on medications  . Hypothyroid   . Left wrist fracture   . Obesity   . Osteoarthritis   . Osteopenia   . Osteoporosis   . Overactive bladder   . Plantar fasciitis   . Pneumonia 08/2012  . Rosacea   . Sleep behavior disorder, REM   . Toenail fungus   . UTI (urinary tract infection)    Patient Active Problem List   Diagnosis Date Noted  . Degenerative disc disease, lumbar 10/20/2015  . History of encephalopathy 10/20/2015  . Family history of breast cancer in first degree relative 04/13/2015  . Anemia, iron deficiency 04/13/2015  . Vitamin D deficiency 04/13/2015  . Rosacea, acne 03/16/2015  . Meningioma ([place]) 01/07/2015  . Cognitive impairment 01/07/2015  . Spondylolisthesis at L4-L5 level 08/18/2013  . 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 H/O vitamin D deficiency 08/13/2011  . Major Depressive Disorder 05/17/2011  . Fibromyalgia 05/17/2011  . Hypothyroidism 05/17/2011  . Sleep behavior disorder, REM  05/17/2011  . Osteopenia 05/17/2011  . Obesity 05/17/2011  . Plantar fasciitis 05/17/2011   Home Medication(s) Prior to Admission medications   Medication Sig Start Date End Date Taking? Authorizing Provider  buPROPion (WELLBUTRIN SR) 150 MG 12 hr tablet Take 1 tablet (150 mg total) by mouth 2 (two) times daily. Patient taking differently: Take 150 mg by mouth daily.  06/27/17 09/14/28 Yes Eksir, Richard Miu, MD  DULoxetine (CYMBALTA) 60 MG capsule Take 1 capsule (60 mg total) by mouth 2 (two) times daily. 06/27/17  Yes Eksir, Richard Miu, MD  fluticasone [facility]) 50 MCG/ACT nasal spray Place 1 spray into both nostrils at bedtime.   Yes [provider]  levothyroxine (SYNTHROID, LEVOTHROID) 50 MCG tablet Take 1 tablet (50 mcg total) by mouth daily before breakfast. 05/01/17  Yes Wardell Honour, MD  sodium chloride (OCEAN) 0.65 % SOLN nasal spray Place 1 spray into both nostrils in the morning and at bedtime.   Yes [provider]  temazepam (RESTORIL) 15 MG capsule Take 15 mg by mouth at bedtime as needed for sleep.   Yes [provider]  terbinafine (LAMISIL) 250 MG tablet Take 250 mg by mouth daily. 08/26/19  Yes [provider]  traZODone (DESYREL) 50 MG tablet Take 1 tablet (50 mg total) by mouth at bedtime. 11/27/17  Yes Eksir, Richard Miu, MD  ibuprofen (ADVIL) 600 MG tablet Take 1 tablet (600 mg total)  by mouth every 6 (six) hours as needed. 09/15/19   Fatima Blank, MD  LORazepam (ATIVAN) 0.5 MG tablet Okay to take 2-3 times per month for anxiety attacks Patient not taking: Reported on 09/15/2019 09/19/17   Aundra Dubin, MD  tamsulosin (FLOMAX) 0.4 MG CAPS capsule Take 1 capsule (0.4 mg total) by mouth daily for 7 days. 09/15/19 09/22/19  Fatima Blank, MD                                                                                                                                    Past Surgical History Past Surgical  History:  Procedure Laterality Date  . ABDOMINAL HYSTERECTOMY     uterine fibroid; ovaries intact. DUB.  [facility] HAND SURGERY Bilateral    ligimant transfere to thumbs  . LAMINECTOMY  08/18/2013   L 4 L5    DR CRAM  . MAXIMUM ACCESS (MAS)POSTERIOR LUMBAR INTERBODY FUSION (PLIF) 1 LEVEL N/A 08/18/2013   Procedure: FOR MAXIMUM ACCESS (MAS) POSTERIOR LUMBAR INTERBODY FUSION (PLIF) 1 LEVEL;  Surgeon: Elaina Hoops, MD;  Location: [facility];  Service: Neurosurgery;  Laterality: N/A;  FOR MAXIMUM ACCESS (MAS) POSTERIOR LUMBAR INTERBODY FUSION (PLIF) 1 LEVEL L4-5  . OPEN REDUCTION INTERNAL FIXATION (ORIF) DISTAL RADIAL FRACTURE Left 04/07/2019   Procedure: OPEN REDUCTION INTERNAL FIXATION (ORIF) DISTAL RADIAL FRACTURE;  Surgeon: Iran Planas, MD;  Location: [place];  Service: Orthopedics;  Laterality: Left;  with IV sedation  . SPINE SURGERY     Lamincetomy and Disectomy L5- S1  . TONSILLECTOMY AND ADENOIDECTOMY     Family History Family History  Problem Relation Age of Onset  . Cancer Mother 54       lung cancer  . Hypertension Mother   . Cancer Sister        Breast cancer  . Heart disease Sister   . Asthma Sister   . Asthma Brother   . Drug abuse Brother   . Hypertension Brother     Social History Social History   Tobacco Use  . Smoking status: Never Smoker  . Smokeless tobacco: Never Used  Substance Use Topics  . Alcohol use: Yes    Alcohol/week: 0.0 standard drinks    Comment: 2-3 weekly  . Drug use: No   Allergies Doxycycline, Sulfa antibiotics, Tape, and Methocarbamol  Review of Systems Review of Systems All other systems are reviewed and are negative for acute change except as noted in the HPI  Physical Exam Vital Signs  I have reviewed the triage vital signs BP 139/79   Pulse 72   Temp (!) 96.2 F (35.7 C) (Axillary)   Resp 15   Ht 4\' 11"  (1.499 m) Comment: Simultaneous filing. User may not have seen previous data.  Wt 73.9 kg Comment:  Simultaneous filing. User may not have seen previous data.  SpO2 96%   BMI 32.92 kg/m  Physical Exam Vitals reviewed.  Constitutional:      General: She is not in acute distress.    Appearance: She is well-developed. She is not diaphoretic.  HENT:     Head: Normocephalic and atraumatic.     Right Ear: External ear normal.     Left Ear: External ear normal.     Nose: Nose normal.  Eyes:     General: No scleral icterus.    Conjunctiva/sclera: Conjunctivae normal.  Neck:     Trachea: Phonation normal.  Cardiovascular:     Rate and Rhythm: Normal rate and regular rhythm.  Pulmonary:     Effort: Pulmonary effort is normal. No respiratory distress.     Breath sounds: No stridor.  Abdominal:     General: There is no distension.     Tenderness: There is abdominal tenderness in the left upper quadrant. There is no guarding or rebound. Negative signs include Murphy's sign and McBurney's sign.  Musculoskeletal:        General: Normal range of motion.     Cervical back: Normal range of motion.  Neurological:     Mental Status: She is alert and oriented to person, place, and time.  Psychiatric:        Behavior: Behavior normal.     ED Results and Treatments Labs (all labs ordered are listed, but only abnormal results are displayed) Labs Reviewed  LIPASE, BLOOD - Abnormal; Notable for the following components:      Result Value   Lipase 103 (*)    All other components within normal limits  COMPREHENSIVE METABOLIC PANEL - Abnormal; Notable for the following components:   Glucose, Bld 125 (*)    Total Protein 6.4 (*)    All other components within normal limits  CBC - Abnormal; Notable for the following components:   RBC 5.30 (*)    Hemoglobin 15.1 (*)    HCT 46.3 (*)    All other components within normal limits  URINALYSIS, ROUTINE W REFLEX MICROSCOPIC - Abnormal; Notable for the following components:   Specific Gravity, Urine >1.046 (*)    Ketones, ur 5 (*)    All other  components within normal limits                                                                                                                         EKG  EKG Interpretation  Date/Time:    Ventricular Rate:    PR Interval:    QRS Duration:   QT Interval:    QTC Calculation:   R Axis:     Text Interpretation:        Radiology CT ABDOMEN PELVIS W CONTRAST  Result Date: 09/15/2019 CLINICAL DATA:  Upper abdominal pain EXAM: CT ABDOMEN AND PELVIS WITH CONTRAST TECHNIQUE: Multidetector CT imaging of the abdomen and pelvis was performed using the standard protocol following bolus administration of intravenous contrast. CONTRAST:  169mL OMNIPAQUE IOHEXOL 300 MG/ML  SOLN COMPARISON:  None. FINDINGS:  Lower chest: No acute abnormality. Hepatobiliary: Scattered cysts are noted within the liver. No focal mass is seen. No gallstones, gallbladder wall thickening, or biliary dilatation. Pancreas: Unremarkable. No pancreatic ductal dilatation or surrounding inflammatory changes. Spleen: Normal in size without focal abnormality. Adrenals/Urinary Tract: Adrenal glands are within normal limits. Kidneys demonstrate a normal enhancement pattern bilaterally. Normal excretion of contrast from the right kidney is noted with opacification of the right ureter. There is fullness of the left renal collecting system which extends distally with some inflammatory changes surrounding the left ureter. The left ureter insertion is somewhat inferior with a small 2-3 mm stone causing obstructive change. Bladder is partially distended. Stomach/Bowel: The appendix is within normal limits. No obstructive or inflammatory changes of the colon are seen. Small bowel and stomach are within normal limits. Multiple tablets are seen within the stomach. Vascular/Lymphatic: Aortic atherosclerosis. No enlarged abdominal or pelvic lymph nodes. Reproductive: Status post hysterectomy. No adnexal masses. Other: No abdominal wall hernia or  abnormality. No abdominopelvic ascites. Musculoskeletal: Postsurgical changes and degenerative changes of lumbar spine are noted. Stable anterolisthesis of L4 on L5 is noted. IMPRESSION: 2-3 mm distal left ureteral stone causing obstructive change with inflammatory edema surrounding the left ureter. Possibility of underlying UTI deserves consideration. Normal-appearing pancreas No other focal abnormality is noted. Electronically Signed   By: Inez Catalina M.D.   On: 09/15/2019 02:00   DG Abdomen Acute W/Chest  Result Date: 09/14/2019 CLINICAL DATA:  64 year old female with left lower quadrant abdominal pain. EXAM: DG ABDOMEN ACUTE W/ 1V CHEST COMPARISON:  Chest radiograph dated 08/12/2013. FINDINGS: No focal consolidation, pleural effusion, pneumothorax. The cardiac silhouette is within normal limits. Moderate stool throughout the colon. No bowel dilatation or evidence of obstruction. No free air or radiopaque calculi. Lower lumbar fusion. No acute osseous pathology. IMPRESSION: 1. No acute cardiopulmonary process. 2. No bowel obstruction. Electronically Signed   By: Anner Crete M.D.   On: 09/14/2019 23:48    Pertinent labs & imaging results that were available during my care of the patient were reviewed by me and considered in my medical decision making (see chart for details).  Medications Ordered in ED Medications  sodium chloride flush (NS) 0.9 % injection 3 mL (has no administration in time range)  sodium chloride 0.9 % bolus 1,000 mL (0 mLs Intravenous Stopped 09/15/19 0306)    Followed by  0.9 %  sodium chloride infusion (0 mLs Intravenous Stopped 09/15/19 0435)  ibuprofen (ADVIL) tablet 400 mg (400 mg Oral Given 09/14/19 2321)  HYDROmorphone (DILAUDID) injection 0.5 mg (0.5 mg Intravenous Given 09/15/19 0106)  iohexol (OMNIPAQUE) 300 MG/ML solution 100 mL (100 mLs Intravenous Contrast Given 09/15/19 0135)  ketorolac (TORADOL) 15 MG/ML injection 15 mg (15 mg Intravenous Given 09/15/19 0315)  Procedures Procedures  (including critical care time)  Medical Decision Making / ED Course I have reviewed the nursing notes for this encounter and the patient's prior records (if available in EHR or on provided paperwork).   Emily Steele was evaluated in Emergency Department on 09/15/2019 for the symptoms described in the history of present illness. She was evaluated in the context of the global COVID-19 pandemic, which necessitated consideration that the patient might be at risk for infection with the SARS-CoV-2 virus that causes COVID-19. Institutional protocols and algorithms that pertain to the evaluation of patients at risk for COVID-19 are in a state of rapid change based on information released by regulatory bodies including the CDC and federal and state organizations. These policies and algorithms were followed during the patient's care in the ED.    Clinical Course as of Sep 15 542  Mon Sep 15, 2019  0329 CT notable for left ureteral stone. Pain improved with toradol. Awaiting UA to assess for infection.   [PC]  0543 Ua w/o infection.  Pain controlled. Tolerating oral intake.   [PC]    Clinical Course User Index [PC] Elizbeth Posa, Grayce Sessions, MD     Final Clinical Impression(s) / ED Diagnoses Final diagnoses:  Ureteral calculus, left    The patient appears reasonably screened and/or stabilized for discharge and I doubt any other medical condition or other [facility] requiring further screening, evaluation, or treatment in the ED at this time prior to discharge. Safe for discharge with strict return precautions.  Disposition: Discharge  Condition: Good  I have discussed the results, Dx and Tx plan with the patient/family who expressed understanding and agree(s) with the plan. Discharge instructions discussed at length. The patient/family was given  strict return precautions who verbalized understanding of the instructions. No further questions at time of discharge.    ED Discharge Orders         Ordered    ibuprofen (ADVIL) 600 MG tablet  Every 6 hours PRN     09/15/19 0543    tamsulosin (FLOMAX) 0.4 MG CAPS capsule  Daily     09/15/19 0543           Follow Up: Nickola Major, MD [address] [place] [address] [phone]  Schedule an appointment as soon as possible for a visit  As needed  Urology  Schedule an appointment as soon as possible for a visit  As needed     This chart was dictated using voice recognition software.  Despite best efforts to proofread,  errors can occur which can change the documentation meaning.   Fatima Blank, MD 09/15/19 [phone]

## 2020-02-24 ENCOUNTER — Ambulatory Visit (INDEPENDENT_AMBULATORY_CARE_PROVIDER_SITE_OTHER): Payer: Medicare Other

## 2020-02-24 ENCOUNTER — Ambulatory Visit: Payer: Medicare Other | Admitting: Podiatry

## 2020-02-24 ENCOUNTER — Other Ambulatory Visit: Payer: Self-pay

## 2020-02-24 VITALS — BP 128/76 | HR 81 | Temp 98.0°F

## 2020-02-24 DIAGNOSIS — M79672 Pain in left foot: Secondary | ICD-10-CM

## 2020-02-24 DIAGNOSIS — M79671 Pain in right foot: Secondary | ICD-10-CM

## 2020-02-24 DIAGNOSIS — B351 Tinea unguium: Secondary | ICD-10-CM | POA: Diagnosis not present

## 2020-02-24 DIAGNOSIS — M722 Plantar fascial fibromatosis: Secondary | ICD-10-CM

## 2020-02-24 MED ORDER — GORDONS UREA 40 % EX OINT: TOPICAL_OINTMENT | CUTANEOUS | 0 refills | Status: AC | PRN

## 2020-02-24 NOTE — Progress Notes (Signed)
Cast for neutral post f/o

## 2020-02-24 NOTE — Patient Instructions (Signed)
Plantar Fasciitis (Heel Spur Syndrome) with Rehab The plantar fascia is a fibrous, ligament-like, soft-tissue structure that spans the bottom of the foot. Plantar fasciitis is a condition that causes pain in the foot due to inflammation of the tissue. SYMPTOMS   Pain and tenderness on the underneath side of the foot.  Pain that worsens with standing or walking. CAUSES  Plantar fasciitis is caused by irritation and injury to the plantar fascia on the underneath side of the foot. Common mechanisms of injury include:  Direct trauma to bottom of the foot.  Damage to a small nerve that runs under the foot where the main fascia attaches to the heel bone.  Stress placed on the plantar fascia due to bone spurs. RISK INCREASES WITH:   Activities that place stress on the plantar fascia (running, jumping, pivoting, or cutting).  Poor strength and flexibility.  Improperly fitted shoes.  Tight calf muscles.  Flat feet.  Failure to warm-up properly before activity.  Obesity. PREVENTION  Warm up and stretch properly before activity.  Allow for adequate recovery between workouts.  Maintain physical fitness:  Strength, flexibility, and endurance.  Cardiovascular fitness.  Maintain a health body weight.  Avoid stress on the plantar fascia.  Wear properly fitted shoes, including arch supports for individuals who have flat feet.  PROGNOSIS  If treated properly, then the symptoms of plantar fasciitis usually resolve without surgery. However, occasionally surgery is necessary.  RELATED COMPLICATIONS   Recurrent symptoms that may result in a chronic condition.  Problems of the lower back that are caused by compensating for the injury, such as limping.  Pain or weakness of the foot during push-off following surgery.  Chronic inflammation, scarring, and partial or complete fascia tear, occurring more often from repeated injections.  TREATMENT  Treatment initially involves the  use of ice and medication to help reduce pain and inflammation. The use of strengthening and stretching exercises may help reduce pain with activity, especially stretches of the Achilles tendon. These exercises may be performed at home or with a therapist. Your caregiver may recommend that you use heel cups of arch supports to help reduce stress on the plantar fascia. Occasionally, corticosteroid injections are given to reduce inflammation. If symptoms persist for greater than 6 months despite non-surgical (conservative), then surgery may be recommended.   MEDICATION   If pain medication is necessary, then nonsteroidal anti-inflammatory medications, such as aspirin and ibuprofen, or other minor pain relievers, such as acetaminophen, are often recommended.  Do not take pain medication within 7 days before surgery.  Prescription pain relievers may be given if deemed necessary by your caregiver. Use only as directed and only as much as you need.  Corticosteroid injections may be given by your caregiver. These injections should be reserved for the most serious cases, because they may only be given a certain number of times.  HEAT AND COLD  Cold treatment (icing) relieves pain and reduces inflammation. Cold treatment should be applied for 10 to 15 minutes every 2 to 3 hours for inflammation and pain and immediately after any activity that aggravates your symptoms. Use ice packs or massage the area with a piece of ice (ice massage).  Heat treatment may be used prior to performing the stretching and strengthening activities prescribed by your caregiver, physical therapist, or athletic trainer. Use a heat pack or soak the injury in warm water.  SEEK IMMEDIATE MEDICAL CARE IF:  Treatment seems to offer no benefit, or the condition worsens.  Any medications   produce adverse side effects.  EXERCISES- RANGE OF MOTION (ROM) AND STRETCHING EXERCISES - Plantar Fasciitis (Heel Spur Syndrome) These exercises  may help you when beginning to rehabilitate your injury. Your symptoms may resolve with or without further involvement from your physician, physical therapist or athletic trainer. While completing these exercises, remember:   Restoring tissue flexibility helps normal motion to return to the joints. This allows healthier, less painful movement and activity.  An effective stretch should be held for at least 30 seconds.  A stretch should never be painful. You should only feel a gentle lengthening or release in the stretched tissue.  RANGE OF MOTION - Toe Extension, Flexion  Sit with your right / left leg crossed over your opposite knee.  Grasp your toes and gently pull them back toward the top of your foot. You should feel a stretch on the bottom of your toes and/or foot.  Hold this stretch for 10 seconds.  Now, gently pull your toes toward the bottom of your foot. You should feel a stretch on the top of your toes and or foot.  Hold this stretch for 10 seconds. Repeat  times. Complete this stretch 3 times per day.   RANGE OF MOTION - Ankle Dorsiflexion, Active Assisted  Remove shoes and sit on a chair that is preferably not on a carpeted surface.  Place right / left foot under knee. Extend your opposite leg for support.  Keeping your heel down, slide your right / left foot back toward the chair until you feel a stretch at your ankle or calf. If you do not feel a stretch, slide your bottom forward to the edge of the chair, while still keeping your heel down.  Hold this stretch for 10 seconds. Repeat 3 times. Complete this stretch 2 times per day.   STRETCH  Gastroc, Standing  Place hands on wall.  Extend right / left leg, keeping the front knee somewhat bent.  Slightly point your toes inward on your back foot.  Keeping your right / left heel on the floor and your knee straight, shift your weight toward the wall, not allowing your back to arch.  You should feel a gentle stretch  in the right / left calf. Hold this position for 10 seconds. Repeat 3 times. Complete this stretch 2 times per day.  STRETCH  Soleus, Standing  Place hands on wall.  Extend right / left leg, keeping the other knee somewhat bent.  Slightly point your toes inward on your back foot.  Keep your right / left heel on the floor, bend your back knee, and slightly shift your weight over the back leg so that you feel a gentle stretch deep in your back calf.  Hold this position for 10 seconds. Repeat 3 times. Complete this stretch 2 times per day.  STRETCH  Gastrocsoleus, Standing  Note: This exercise can place a lot of stress on your foot and ankle. Please complete this exercise only if specifically instructed by your caregiver.   Place the ball of your right / left foot on a step, keeping your other foot firmly on the same step.  Hold on to the wall or a rail for balance.  Slowly lift your other foot, allowing your body weight to press your heel down over the edge of the step.  You should feel a stretch in your right / left calf.  Hold this position for 10 seconds.  Repeat this exercise with a slight bend in your right /   left knee. Repeat 3 times. Complete this stretch 2 times per day.   STRENGTHENING EXERCISES - Plantar Fasciitis (Heel Spur Syndrome)  These exercises may help you when beginning to rehabilitate your injury. They may resolve your symptoms with or without further involvement from your physician, physical therapist or athletic trainer. While completing these exercises, remember:   Muscles can gain both the endurance and the strength needed for everyday activities through controlled exercises.  Complete these exercises as instructed by your physician, physical therapist or athletic trainer. Progress the resistance and repetitions only as guided.  STRENGTH - Towel Curls  Sit in a chair positioned on a non-carpeted surface.  Place your foot on a towel, keeping your heel  on the floor.  Pull the towel toward your heel by only curling your toes. Keep your heel on the floor. Repeat 3 times. Complete this exercise 2 times per day.  STRENGTH - Ankle Inversion  Secure one end of a rubber exercise band/tubing to a fixed object (table, pole). Loop the other end around your foot just before your toes.  Place your fists between your knees. This will focus your strengthening at your ankle.  Slowly, pull your big toe up and in, making sure the band/tubing is positioned to resist the entire motion.  Hold this position for 10 seconds.  Have your muscles resist the band/tubing as it slowly pulls your foot back to the starting position. Repeat 3 times. Complete this exercises 2 times per day.  Document Released: 04/03/2005 Document Revised: 06/26/2011 Document Reviewed: 07/16/2008 [facility] Patient Information 2014 [place], [place].   Peroneal Tendinopathy Rehab Ask your health care provider which exercises are safe for you. Do exercises exactly as told by your health care provider and adjust them as directed. It is normal to feel mild stretching, pulling, tightness, or discomfort as you do these exercises. Stop right away if you feel sudden pain or your pain gets worse. Do not begin these exercises until told by your health care provider. Stretching and range-of-motion exercises These exercises warm up your muscles and joints and improve the movement and flexibility of your ankle. These exercises also help to relieve pain and stiffness. Gastroc and soleus stretch, standing This is an exercise in which you stand on a step and use your body weight to stretch your calf muscles. To do this exercise: 1. Stand on the edge of a step on the ball of your left / right foot. The ball of your foot is on the walking surface, right under your toes. 2. Keep your other foot firmly on the same step. 3. Hold on to the wall, a railing, or a chair for balance. 4. Slowly lift your other  foot, allowing your body weight to press your left / right heel down over the edge of the step. You should feel a stretch in your left / right calf (gastrocnemius and soleus). 5. Hold this position for __________ seconds. 6. Return both feet to the step. 7. Repeat this exercise with a slight bend in your left / right knee. Repeat __________ times with your left / right knee straight and __________ times with your left / right knee bent. Complete this exercise __________ times a day. Strengthening exercises These exercises build strength and endurance in your foot and ankle. Endurance is the ability to use your muscles for a long time, even after they get tired. Ankle dorsiflexion with band  1. Secure a rubber exercise band or tube to an object, such as a  table leg, that will not move when the band is pulled. 2. Secure the other end of the band around your left / right foot. 3. Sit on the floor, facing the object with your left / right leg extended. The band or tube should be slightly tense when your foot is relaxed. 4. Slowly flex your left / right ankle and toes to bring your foot toward you (dorsiflexion). 5. Hold this position for __________ seconds. 6. Let the band or tube slowly pull your foot back to the starting position. Repeat __________ times. Complete this exercise __________ times a day. Ankle eversion 1. Sit on the floor with your legs straight out in front of you. 2. Loop a rubber exercise band or tube around the ball of your left / right foot. The ball of your foot is on the walking surface, right under your toes. 3. Hold the ends of the band in your hands, or secure the band to a stable object. The band or tube should be slightly tense when your foot is relaxed. 4. Slowly push your foot outward, away from your other leg (eversion). 5. Hold this position for __________ seconds. 6. Slowly return your foot to the starting position. Repeat __________ times. Complete this exercise  __________ times a day. Plantar flexion, standing This exercise is sometimes called standing heel raise. 1. Stand with your feet shoulder-width apart. 2. Place your hands on a wall or table to steady yourself as needed, but try not to use it for support. 3. Keep your weight spread evenly over the width of your feet while you slowly rise up on your toes (plantar flexion). If told by your health care provider: ? Shift your weight toward your left / right leg until you feel challenged. ? Stand on your left / right leg only. 4. Hold this position for __________ seconds. Repeat __________ times. Complete this exercise __________ times a day. Single leg stand 1. Without shoes, stand near a railing or in a doorway. You may hold on to the railing or door frame as needed. 2. Stand on your left / right foot. Keep your big toe down on the floor and try to keep your arch lifted. ? Do not roll to the outside of your foot. ? If this exercise is too easy, you can try it with your eyes closed or while standing on a pillow. 3. Hold this position for __________ seconds. Repeat __________ times. Complete this exercise __________ times a day. This information is not intended to replace advice given to you by your health care provider. Make sure you discuss any questions you have with your health care provider. Document Revised: 07/23/2018 Document Reviewed: 07/23/2018 Elsevier Patient Education  [place].

## 2020-02-26 NOTE — Progress Notes (Signed)
Subjective:   Patient ID: Emily Steele, female   DOB: 64 y.o.   MRN: [id number]   HPI 64 year old female presents the office today for orthotic evaluation.  She states that she prescribed orthotics and has done well with it wants her to get a new pair of orthotics.  She does have history of foot pain, plantar fasciitis.  She states that the orthotics have worn out but pain is starting to come back.  She denies recent injury or trauma.  No swelling redness.  No numbness or tingling or radiating pain.  She also has had toenail fungus and she has been on oral medication for this.  She thinks the nail doing better.  She has been on Lamisil now for about 8 months she reports.    Review of Systems  All other systems reviewed and are negative.  Past Medical History:  Diagnosis Date  . Allergy   . Anemia   . Anxiety   . Arthritis   . Brain tumor ([place])    right temporal, not ca  . Constipation   . DDD (degenerative disc disease)   . Depression   . Eczema   . Fibromyalgia   . Fibromyalgia   . Hearing loss    1 ear - mild  . Herpes simplex   . Hx of migraine headaches   . Hypertension    not on medications  . Hypothyroid   . Left wrist fracture   . Obesity   . Osteoarthritis   . Osteopenia   . Osteoporosis   . Overactive bladder   . Plantar fasciitis   . Pneumonia 08/2012  . Rosacea   . Sleep behavior disorder, REM   . Toenail fungus   . UTI (urinary tract infection)     Past Surgical History:  Procedure Laterality Date  . ABDOMINAL HYSTERECTOMY     uterine fibroid; ovaries intact. DUB.  [facility] HAND SURGERY Bilateral    ligimant transfere to thumbs  . LAMINECTOMY  08/18/2013   L 4 L5    DR CRAM  . MAXIMUM ACCESS (MAS)POSTERIOR LUMBAR INTERBODY FUSION (PLIF) 1 LEVEL N/A 08/18/2013   Procedure: FOR MAXIMUM ACCESS (MAS) POSTERIOR LUMBAR INTERBODY FUSION (PLIF) 1 LEVEL;  Surgeon: Elaina Hoops, MD;  Location: [facility];  Service: Neurosurgery;  Laterality: N/A;  FOR MAXIMUM ACCESS  (MAS) POSTERIOR LUMBAR INTERBODY FUSION (PLIF) 1 LEVEL L4-5  . OPEN REDUCTION INTERNAL FIXATION (ORIF) DISTAL RADIAL FRACTURE Left 04/07/2019   Procedure: OPEN REDUCTION INTERNAL FIXATION (ORIF) DISTAL RADIAL FRACTURE;  Surgeon: Iran Planas, MD;  Location: [facility];  Service: Orthopedics;  Laterality: Left;  with IV sedation  . SPINE SURGERY     Lamincetomy and Disectomy L5- S1  . TONSILLECTOMY AND ADENOIDECTOMY       Current Outpatient Medications:  .  buPROPion (WELLBUTRIN SR) 150 MG 12 hr tablet, Take 1 tablet (150 mg total) by mouth 2 (two) times daily. (Patient taking differently: Take 150 mg by mouth daily. ), Disp: 180 tablet, Rfl: 1 .  DULoxetine (CYMBALTA) 60 MG capsule, Take 1 capsule (60 mg total) by mouth 2 (two) times daily., Disp: 180 capsule, Rfl: 1 .  fluticasone (FLONASE) 50 MCG/ACT nasal spray, Place 1 spray into both nostrils at bedtime., Disp: , Rfl:  .  ibuprofen (ADVIL) 600 MG tablet, Take 1 tablet (600 mg total) by mouth every 6 (six) hours as needed., Disp: 30 tablet, Rfl: 0 .  levothyroxine (SYNTHROID, LEVOTHROID) 50 MCG tablet, Take  1 tablet (50 mcg total) by mouth daily before breakfast., Disp: 90 tablet, Rfl: 1 .  LORazepam (ATIVAN) 0.5 MG tablet, Okay to take 2-3 times per month for anxiety attacks (Patient not taking: Reported on 09/15/2019), Disp: 30 tablet, Rfl: 0 .  sodium chloride (OCEAN) 0.65 % SOLN nasal spray, Place 1 spray into both nostrils in the morning and at bedtime., Disp: , Rfl:  .  temazepam (RESTORIL) 15 MG capsule, Take 15 mg by mouth at bedtime as needed for sleep., Disp: , Rfl:  .  terbinafine (LAMISIL) 250 MG tablet, Take 250 mg by mouth daily., Disp: , Rfl:  .  traZODone (DESYREL) 50 MG tablet, Take 1 tablet (50 mg total) by mouth at bedtime., Disp: 90 tablet, Rfl: 0 .  urea (GORDONS UREA) 40 % ointment, Apply topically as needed., Disp: 30 g, Rfl: 0  Allergies  Allergen Reactions  . Doxycycline Other (See Comments)     headaches  . Sulfa Antibiotics     Headaches  . Tape Itching  . Methocarbamol Nausea Only    headaches         Objective:  Physical Exam  General: AAO x3, NAD  Dermatological: Nails are moderate discolored with yellow-brown discoloration but the proximal aspect is clearing.  No edema, erythema or signs of infection.  No drainage or pus.  Vascular: Dorsalis Pedis artery and Posterior Tibial artery pedal pulses are 2/4 bilateral with immedate capillary fill time. There is no pain with calf compression, swelling, warmth, erythema.   Neruologic: Grossly intact via light touch bilateral.   Musculoskeletal: There is minimal discomfort on the plantar fascial bilaterally.  There is no area pinpoint tenderness.  There is no edema, erythema.  Flexor, extensor tendons appear intact.  Muscular strength 5/5 in all groups tested bilateral.  Gait: Unassisted, Nonantalgic.       Assessment:   64 year old female with plantar fasciitis requesting orthotics; onychomycosis, currently on Lamisil     Plan:  -Treatment options discussed including all alternatives, risks, and complications -Etiology of symptoms were discussed -She was measured for new orthotics today by Liliane Channel.  Discussed traction, icing daily.  Discussed shoe modifications -At this point she has been on oral Lamisil for about 9 months now recommended to stop the medication.  I believe the nails to grow out at this point.  Trula Slade DPM

## 2020-03-26 ENCOUNTER — Encounter: Payer: Medicare Other | Admitting: Orthotics

## 2020-05-04 ENCOUNTER — Encounter: Payer: Medicare Other | Admitting: Orthotics

## 2020-05-24 ENCOUNTER — Telehealth: Payer: Self-pay | Admitting: Podiatry

## 2020-05-24 NOTE — Telephone Encounter (Signed)
Pt called about the orthotic bill she received.She said I was the 3rd person she has talked to about this with no resolution.   She did sign the abn stating she would be responsible for the cost if not covered but states she cannot afford that and that she has gotten orthotics other places and they were no more than 100.00. I explained that she did sign the form stating she would pay and she said are you listening to me and I said yes ma'am. She said when she signed the form she thought it was including the doctor visit. I explained that the 438.00 was the orthotics cost. She said it should have been explained better by Liliane Channel. She said alright I don't want them and hung up on me.

## 2020-05-24 NOTE — Telephone Encounter (Signed)
Ok, please delete the charges. We can also do a Powerstep insert for her that would be cheaper if she would like

## 2020-08-19 ENCOUNTER — Other Ambulatory Visit: Payer: Self-pay

## 2020-08-19 ENCOUNTER — Ambulatory Visit
Admission: RE | Admit: 2020-08-19 | Discharge: 2020-08-19 | Disposition: A | Discharge status: Home / Self Care | Payer: Medicare Other | Source: Ambulatory Visit | Attending: Family Medicine | Admitting: Family Medicine

## 2020-08-19 ENCOUNTER — Other Ambulatory Visit: Payer: Self-pay | Admitting: Family Medicine

## 2020-08-19 DIAGNOSIS — M5030 Other cervical disc degeneration, unspecified cervical region: Secondary | ICD-10-CM

## 2021-05-04 NOTE — Progress Notes (Signed)
Office Visit Note  Patient: Emily Steele             Date of Birth: [birth year]           MRN: [id number]             PCP: Nickola Major, MD Referring: Nickola Major, MD Visit Date: 05/18/2021 Occupation: @GUAROCC @  Subjective:  Pain in multiple joints  History of Present Illness: Emily Steele is a 66 y.o. female patient is seen in consultation per request of her PCP.  She has seen me in the past.  Her initial visit with me was in 2007.  At the time she presented with generalized muscle and joint pain.  Her clinical findings and radiographic findings were consistent with osteoarthritis of bilateral hands and bilateral feet.  She was later given the diagnosis of fibromyalgia syndrome.  She also had symptoms of Planter fasciitis for which she was referred to Dr. Sharol Given.  At the time she was already seeing Dr. Phillip Heal and had work-up for her lumbar spine.  She had the diagnosis of degenerative disease of lumbar spine.  Her last office visit was in December 2016.  Over the years she has been diagnosed with degenerative disease of cervical spine.  She continues to have pain in her cervical and lumbar spine.  She has been having increased pain and discomfort in her cervical spine and difficulty rotating her cervical spine.  She has an appointment with Dr. Phillip Heal tomorrow.  She states recently she has noticed that her hands have become "crooked".  She is also experiencing pain and discomfort over bilateral trochanteric area.  She denies any nocturnal pain.  She complains of pain in her hands and swelling in her hands mostly in her DIP joints.  She denies any history of psoriasis or rash.  There is no family history of autoimmune disease.  There is no history of ankylosing spondylitis, psoriasis in her family.  She has been going to physical therapy which has been helpful.  Activities of Daily Living:  Patient reports morning stiffness for 2-3 hours.   Patient Denies nocturnal pain.  Difficulty  dressing/grooming: Denies Difficulty climbing stairs: Reports Difficulty getting out of chair: Denies Difficulty using hands for taps, buttons, cutlery, and/or writing: Reports  Review of Systems  Constitutional:  Positive for fatigue.  HENT:  Negative for mouth sores, mouth dryness and nose dryness.   Eyes:  Negative for pain, itching and dryness.  Respiratory:  Negative for shortness of breath and difficulty breathing.   Cardiovascular:  Negative for chest pain and palpitations.  Gastrointestinal:  Positive for constipation. Negative for blood in stool and diarrhea.  Endocrine: Negative for increased urination.  Genitourinary:  Negative for difficulty urinating.  Musculoskeletal:  Positive for joint pain, joint pain, joint swelling, myalgias, morning stiffness, muscle tenderness and myalgias.  Skin:  Negative for color change, rash, redness and sensitivity to sunlight.  Allergic/Immunologic: Negative for susceptible to infections.  Neurological:  Positive for numbness and memory loss. Negative for dizziness, headaches and weakness.  Hematological:  Positive for bruising/bleeding tendency. Negative for swollen glands.  Psychiatric/Behavioral:  Positive for depressed mood and sleep disturbance. Negative for confusion. The patient is nervous/anxious.    PMFS History:  Patient Active Problem List   Diagnosis Date Noted   Degenerative disc disease, lumbar 10/20/2015   History of encephalopathy 10/20/2015   Family history of breast cancer in first degree relative 04/13/2015   Anemia, iron deficiency 04/13/2015  Vitamin D deficiency 04/13/2015   Rosacea, acne 03/16/2015   Meningioma ([place]) 01/07/2015   Cognitive impairment 01/07/2015   Spondylolisthesis at L4-L5 level 08/18/2013   Herpetic encephalitis 08/17/2012   H/O vitamin D deficiency 08/13/2011   Major Depressive Disorder 05/17/2011   Fibromyalgia 05/17/2011   Hypothyroidism 05/17/2011   Sleep behavior disorder, REM 05/17/2011    Osteopenia 05/17/2011   Obesity 05/17/2011   Plantar fasciitis 05/17/2011    Past Medical History:  Diagnosis Date   Allergy    Anemia    Anxiety    Arthritis    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, 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   Constipation    DDD (degenerative disc disease)    Depression    Eczema    Fibromyalgia    Fibromyalgia    Hearing loss    1 ear - mild   Herpes simplex    Hx of migraine headaches    Hypertension    not on medications   Hypothyroid    Left wrist fracture    Obesity    Osteoarthritis    Osteopenia    Osteoporosis    Overactive bladder    Plantar fasciitis    Pneumonia 08/2012   Rosacea    Sleep behavior disorder, REM    Toenail fungus    UTI (urinary tract infection)     Family History  Problem Relation Age of Onset   Asthma Mother    Cancer Mother 33       lung cancer   Hypertension Mother    Cancer Sister        Breast cancer   Drug abuse Brother    Cancer Brother    Past Surgical History:  Procedure Laterality Date   ABDOMINAL HYSTERECTOMY     uterine fibroid; ovaries intact. DUB.   HAND SURGERY Bilateral    ligimant transfere to thumbs   LAMINECTOMY  08/18/2013   L 4 L5    DR CRAM   MAXIMUM ACCESS (MAS)POSTERIOR LUMBAR INTERBODY FUSION (PLIF) 1 LEVEL N/A 08/18/2013   Procedure: FOR MAXIMUM ACCESS (MAS) POSTERIOR LUMBAR INTERBODY FUSION (PLIF) 1 LEVEL;  Surgeon: Elaina Hoops, MD;  Location: [facility];  Service: Neurosurgery;  Laterality: N/A;  FOR MAXIMUM ACCESS (MAS) POSTERIOR LUMBAR INTERBODY FUSION (PLIF) 1 LEVEL L4-5   OPEN REDUCTION INTERNAL FIXATION (ORIF) DISTAL RADIAL FRACTURE Left 04/07/2019   Procedure: OPEN REDUCTION INTERNAL FIXATION (ORIF) DISTAL RADIAL FRACTURE;  Surgeon: Iran Planas, MD;  Location: [facility];  Service: Orthopedics;  Laterality: Left;  with IV sedation   SPINE SURGERY     Lamincetomy and Disectomy L5- S1   TONSILLECTOMY AND ADENOIDECTOMY     Social History   Social History Narrative   Marital  status: single; not dating.        Children: none      Lives: Patient lives at home alone.  Originally from [place]; moved to [place] in 2000.      Employment: disability in 2011 due to DDD lumbar.  Education officer, [employer].     Tobacco: none      Alcohol: none      Exercise:       ADLs: independent with ADLs in 2019; drives.      Advanced Directives: FULL CODE no prolonged measures.           Caffeine Use: 3-4 cups weekly   Single. Education: [employer].  Exercises 4x's a week.   Immunization History  Administered Date(s)  Administered   Influenza Split 02/29/2012   Influenza,inj,Quad PF,6+ Mos 02/07/2013, 04/08/2014, 03/12/2015   Influenza-Unspecified 03/17/2016, 03/17/2017   PFIZER(Purple Top)SARS-COV-2 Vaccination 07/24/2019, 08/18/2019   Pfizer Covid-19 Vaccine Bivalent Booster 80yrs & up 04/06/2021   Td 04/17/2010   Zoster Recombinat (Shingrix) 10/22/2016, 12/18/2016     Objective: Vital Signs: BP 123/80 (BP Location: Left Arm, Patient Position: Sitting, Cuff Size: Normal)    Pulse (!) 56    Ht 4\' 11"  (1.499 m)    Wt 152 lb 6.4 oz (69.1 kg)    BMI 30.78 kg/m    Physical Exam Vitals and nursing note reviewed.  Constitutional:      Appearance: She is well-developed.  HENT:     Head: Normocephalic and atraumatic.  Eyes:     Conjunctiva/sclera: Conjunctivae normal.  Cardiovascular:     Rate and Rhythm: Normal rate and regular rhythm.     Heart sounds: Normal heart sounds.  Pulmonary:     Effort: Pulmonary effort is normal.     Breath sounds: Normal breath sounds.  Abdominal:     General: Bowel sounds are normal.     Palpations: Abdomen is soft.  Musculoskeletal:     Cervical back: Normal range of motion.  Lymphadenopathy:     Cervical: No cervical adenopathy.  Skin:    General: Skin is warm and dry.     Capillary Refill: Capillary refill takes less than 2 seconds.  Neurological:     Mental Status: She is alert and oriented to person, place, and time.  Psychiatric:         Behavior: Behavior normal.     Musculoskeletal Exam: She had painful limited range of motion of her cervical spine.  She had limited mobility in her lumbar spine due to previous fusion.  She had no tenderness over SI joints.  Shoulder joints, elbow joints, wrist joints and good range of motion.  She had bilateral DIP thickening with no synovitis.  Hip joints and knee joints with good range of motion.  She had no tenderness over ankles or MTPs.  CDAI Exam: CDAI Score: -- Patient Global: --; Provider Global: -- Swollen: --; Tender: -- Joint Exam 05/18/2021   No joint exam has been documented for this visit   There is currently no information documented on the homunculus. Go to the Rheumatology activity and complete the homunculus joint exam.  Investigation: No additional findings.  Imaging: No results found.  Recent Labs: Lab Results  Component Value Date   WBC 5.4 09/14/2019   HGB 15.1 (H) 09/14/2019   PLT 176 09/14/2019   NA 141 09/14/2019   K 4.0 09/14/2019   CL 106 09/14/2019   CO2 22 09/14/2019   GLUCOSE 125 (H) 09/14/2019   BUN 19 09/14/2019   CREATININE 0.84 09/14/2019   BILITOT 0.8 09/14/2019   ALKPHOS 73 09/14/2019   AST 28 09/14/2019   ALT 25 09/14/2019   PROT 6.4 (L) 09/14/2019   ALBUMIN 4.1 09/14/2019   CALCIUM 9.6 09/14/2019   GFRAA >60 09/14/2019    Speciality Comments: No specialty comments available.  Procedures:  No procedures performed Allergies: Doxycycline, Sulfa antibiotics, Tape, and Methocarbamol   Assessment / Plan:     Visit Diagnoses: Polyarthralgia-she complains of pain and discomfort in her cervical spine today.  She states she has had chronic lower back pain for many years.  She also has discomfort in her hands.  She denies any history of joint swelling.  There was a concern about ankylosing spondylitis  based on her recent cervical spine x-ray.  She was referred to me for further evaluation.  I reviewed x-rays of the cervical  thoracic and lumbar spine from the past and also MRI of the pelvis.  There has been mention of SI joint fusion in the last few MRI and x-rays.  She also has ankylosis of C4 and C5 vertebrae.  I discussed the possibility of ankylosing spondylitis with the patient.  She has no SI joint discomfort.  She denies any discomfort in her entire spine except for the cervical spine.  She has an appointment coming up with Dr. Saintclair Halsted tomorrow.  I also discussed treatment options for ankylosing spondylitis which usually include Biologics and immunosuppression.  Patient is against any immunosuppression.  I advised her to discuss this further with her neurosurgeon and he will let me know if she wants to proceed with the treatment.  DDD (degenerative disc disease), cervical-I reviewed x-rays of the cervical spine read by Dr. Joneen Caraway which showed C4-5 ankylosis.  She also had multilevel spondylosis facet joint arthropathy.  This raises concern about possible ankylosing spondylitis.  Patient had limited range of motion today.  Patient states this is new onset decreased range of motion.  She has appointment with Dr. Saintclair Halsted tomorrow for evaluation.  DDD thoracic spine-I reviewed x-rays from December 01, 2013 which showed multilevel spondylosis.  No typical syndesmophytes were noted.  DDD (degenerative disc disease), lumbar-she has been under care of Dr. Phillip Heal for many years for degenerative disc disease of lumbar spine and had lumbar spine L4-5 fusion in the past.  I reviewed the x-rays from February 24, 2019 which did not show any syndesmophytes.  She had no tenderness on palpation over her SI joints.  She had limited mobility of her lumbar spine.  Her MRI of the pelvis showed fused SI joints left greater than right.  This also raises the concern about ankylosing spondylitis.  Osteoarthritis bilateral hands-she had bilateral DIP thickening but no synovitis was noted.  There was no evidence of psoriasis or nail pitting.  She declined  x-rays of the bilateral hands.  Fibromyalgia-she has longstanding history of fibromyalgia.  She continues to have some generalized pain and discomfort.  Other insomnia-she takes trazodone which helps.  Spondylolisthesis at L4-L5 level  Plantar fasciitis-patient had an episode of plantar fasciitis several years ago which resolved.  She denies any recurrence of plantar fasciitis.  Other medical problems are listed as follows:  Osteopenia of both hips  Vitamin D deficiency  Meningioma ([place])  Herpetic encephalitis  Hypothyroidism due to acquired atrophy of thyroid  Rosacea, acne  Sleep behavior disorder, REM  Recurrent major depressive disorder, in partial remission ([place])  History of encephalopathy  Family history of breast cancer in first degree relative  Other iron deficiency anemia  Orders: No orders of the defined types were placed in this encounter.  No orders of the defined types were placed in this encounter.  Total time spent in reviewing records, previous radiographs, MRI examining patient counseling patient and discussing different treatment options was 60 minutes.  Follow-Up Instructions: Return for Osteoarthritis, Ankylosing spondylitis.   Bo Merino, MD  Note - This record has been created using Editor, commissioning.  Chart creation errors have been sought, but may not always  have been located. Such creation errors do not reflect on  the standard of medical care.

## 2021-05-18 ENCOUNTER — Other Ambulatory Visit: Payer: Self-pay

## 2021-05-18 ENCOUNTER — Encounter: Payer: Self-pay | Admitting: Rheumatology

## 2021-05-18 ENCOUNTER — Ambulatory Visit: Payer: Medicare Other | Admitting: Rheumatology

## 2021-05-18 VITALS — BP 123/80 | HR 56 | Ht 59.0 in | Wt 152.4 lb

## 2021-05-18 DIAGNOSIS — M5134 Other intervertebral disc degeneration, thoracic region: Secondary | ICD-10-CM

## 2021-05-18 DIAGNOSIS — F3341 Major depressive disorder, recurrent, in partial remission: Secondary | ICD-10-CM

## 2021-05-18 DIAGNOSIS — M722 Plantar fascial fibromatosis: Secondary | ICD-10-CM

## 2021-05-18 DIAGNOSIS — G4752 REM sleep behavior disorder: Secondary | ICD-10-CM

## 2021-05-18 DIAGNOSIS — M19041 Primary osteoarthritis, right hand: Secondary | ICD-10-CM

## 2021-05-18 DIAGNOSIS — M2550 Pain in unspecified joint: Secondary | ICD-10-CM | POA: Diagnosis not present

## 2021-05-18 DIAGNOSIS — E559 Vitamin D deficiency, unspecified: Secondary | ICD-10-CM

## 2021-05-18 DIAGNOSIS — M5136 Other intervertebral disc degeneration, lumbar region: Secondary | ICD-10-CM

## 2021-05-18 DIAGNOSIS — M19072 Primary osteoarthritis, left ankle and foot: Secondary | ICD-10-CM

## 2021-05-18 DIAGNOSIS — B004 Herpesviral encephalitis: Secondary | ICD-10-CM

## 2021-05-18 DIAGNOSIS — G4709 Other insomnia: Secondary | ICD-10-CM

## 2021-05-18 DIAGNOSIS — M5030 Other cervical disc degeneration, unspecified cervical region: Secondary | ICD-10-CM | POA: Diagnosis not present

## 2021-05-18 DIAGNOSIS — D329 Benign neoplasm of meninges, unspecified: Secondary | ICD-10-CM

## 2021-05-18 DIAGNOSIS — M19042 Primary osteoarthritis, left hand: Secondary | ICD-10-CM

## 2021-05-18 DIAGNOSIS — M19071 Primary osteoarthritis, right ankle and foot: Secondary | ICD-10-CM

## 2021-05-18 DIAGNOSIS — E034 Atrophy of thyroid (acquired): Secondary | ICD-10-CM

## 2021-05-18 DIAGNOSIS — Z8669 Personal history of other diseases of the nervous system and sense organs: Secondary | ICD-10-CM

## 2021-05-18 DIAGNOSIS — M85852 Other specified disorders of bone density and structure, left thigh: Secondary | ICD-10-CM

## 2021-05-18 DIAGNOSIS — M797 Fibromyalgia: Secondary | ICD-10-CM

## 2021-05-18 DIAGNOSIS — L719 Rosacea, unspecified: Secondary | ICD-10-CM

## 2021-05-18 DIAGNOSIS — M85851 Other specified disorders of bone density and structure, right thigh: Secondary | ICD-10-CM

## 2021-05-18 DIAGNOSIS — D508 Other iron deficiency anemias: Secondary | ICD-10-CM

## 2021-05-18 DIAGNOSIS — M4316 Spondylolisthesis, lumbar region: Secondary | ICD-10-CM

## 2021-05-18 DIAGNOSIS — Z803 Family history of malignant neoplasm of breast: Secondary | ICD-10-CM

## 2021-06-02 ENCOUNTER — Ambulatory Visit: Payer: Medicare (Managed Care) | Admitting: Rheumatology

## 2021-06-15 NOTE — Progress Notes (Deleted)
? ?Office Visit Note ? ?Patient: Emily Steele             ?Date of Birth: Aug 10, 1955           ?MRN: [id number]             ?PCP: Nickola Major, MD ?Referring: Nickola Major, MD ?Visit Date: 06/24/2021 ?Occupation: @GUAROCC @ ? ?Subjective:  ?No chief complaint on file. ? ? ?History of Present Illness: Emily Steele is a 66 y.o. female ***  ? ?Activities of Daily Living:  ?Patient reports morning stiffness for *** {minute/hour:19697}.   ?Patient {ACTIONS;DENIES/REPORTS:21021675::"Denies"} nocturnal pain.  ?Difficulty dressing/grooming: {ACTIONS;DENIES/REPORTS:21021675::"Denies"} ?Difficulty climbing stairs: {ACTIONS;DENIES/REPORTS:21021675::"Denies"} ?Difficulty getting out of chair: {ACTIONS;DENIES/REPORTS:21021675::"Denies"} ?Difficulty using hands for taps, buttons, cutlery, and/or writing: {ACTIONS;DENIES/REPORTS:21021675::"Denies"} ? ?No Rheumatology ROS completed.  ? ?PMFS History:  ?Patient Active Problem List  ? Diagnosis Date Noted  ? Degenerative disc disease, lumbar 10/20/2015  ? History of encephalopathy 10/20/2015  ? Family history of breast cancer in first degree relative 04/13/2015  ? Anemia, iron deficiency 04/13/2015  ? Vitamin D deficiency 04/13/2015  ? Rosacea, acne 03/16/2015  ? Meningioma ([place]) 01/07/2015  ? Cognitive impairment 01/07/2015  ? Spondylolisthesis at L4-L5 level 08/18/2013  ? Herpetic encephalitis 08/17/2012  ? H/O vitamin D deficiency 08/13/2011  ? Major Depressive Disorder 05/17/2011  ? Fibromyalgia 05/17/2011  ? Hypothyroidism 05/17/2011  ? Sleep behavior disorder, REM 05/17/2011  ? Osteopenia 05/17/2011  ? Obesity 05/17/2011  ? Plantar fasciitis 05/17/2011  ?  ?Past Medical History:  ?Diagnosis Date  ? Allergy   ? Anemia   ? Anxiety   ? Arthritis   ? Brain tumor ([place])   ? right temporal, not ca  ? Constipation   ? DDD (degenerative disc disease)   ? Depression   ? Eczema   ? Fibromyalgia   ? Fibromyalgia   ? Hearing loss   ? 1 ear - mild  ? Herpes simplex   ? Hx of  migraine headaches   ? Hypertension   ? not on medications  ? Hypothyroid   ? Left wrist fracture   ? Obesity   ? Osteoarthritis   ? Osteopenia   ? Osteoporosis   ? Overactive bladder   ? Plantar fasciitis   ? Pneumonia 08/2012  ? Rosacea   ? Sleep behavior disorder, REM   ? Toenail fungus   ? UTI (urinary tract infection)   ?  ?Family History  ?Problem Relation Age of Onset  ? Asthma Mother   ? Cancer Mother 36  ?     lung cancer  ? Hypertension Mother   ? Cancer Sister   ?     Breast cancer  ? Drug abuse Brother   ? Cancer Brother   ? ?Past Surgical History:  ?Procedure Laterality Date  ? ABDOMINAL HYSTERECTOMY    ? uterine fibroid; ovaries intact. DUB.  ? HAND SURGERY Bilateral   ? ligimant transfere to thumbs  ? LAMINECTOMY  08/18/2013  ? L 4 L5    DR CRAM  ? MAXIMUM ACCESS (MAS)POSTERIOR LUMBAR INTERBODY FUSION (PLIF) 1 LEVEL N/A 08/18/2013  ? Procedure: FOR MAXIMUM ACCESS (MAS) POSTERIOR LUMBAR INTERBODY FUSION (PLIF) 1 LEVEL;  Surgeon: Elaina Hoops, MD;  Location: [facility];  Service: Neurosurgery;  Laterality: N/A;  FOR MAXIMUM ACCESS (MAS) POSTERIOR LUMBAR INTERBODY FUSION (PLIF) 1 LEVEL L4-5  ? OPEN REDUCTION INTERNAL FIXATION (ORIF) DISTAL RADIAL FRACTURE Left 04/07/2019  ? Procedure: OPEN REDUCTION INTERNAL FIXATION (ORIF) DISTAL  RADIAL FRACTURE;  Surgeon: Iran Planas, MD;  Location: [facility];  Service: Orthopedics;  Laterality: Left;  with IV sedation  ? SPINE SURGERY    ? Lamincetomy and Disectomy L5- S1  ? TONSILLECTOMY AND ADENOIDECTOMY    ? ?Social History  ? ?Social History Narrative  ? Marital status: single; not dating.    ?    Children: none  ?    Lives: Patient lives at home alone.  Originally from [place]; moved to [place] in 2000.  ?    Employment: disability in 2011 due to DDD lumbar.  Education officer, [employer].  ?   Tobacco: none  ?    Alcohol: none  ?    Exercise:   ?    ADLs: independent with ADLs in 2019; drives.  ?    Advanced Directives: FULL CODE no prolonged measures.     ?   ?   ? Caffeine Use: 3-4 cups weekly  ? Single. Education: [employer].  Exercises 4x's a week.  ? ?Immunization History  ?Administered Date(s) Administered  ? Influenza Split 02/29/2012  ? Influenza,inj,Quad PF,6+ Mos 02/07/2013, 04/08/2014, 03/12/2015  ? Influenza-Unspecified 03/17/2016, 03/17/2017  ? PFIZER(Purple Top)SARS-COV-2 Vaccination 07/24/2019, 08/18/2019  ? Pension scheme manager 74yrs & up 04/06/2021  ? Td 04/17/2010  ? Zoster Recombinat (Shingrix) 10/22/2016, 12/18/2016  ?  ? ?Objective: ?Vital Signs: There were no vitals taken for this visit.  ? ?Physical Exam  ? ?Musculoskeletal Exam: *** ? ?CDAI Exam: ?CDAI Score: -- ?Patient Global: --; Provider Global: -- ?Swollen: --; Tender: -- ?Joint Exam 06/24/2021  ? ?No joint exam has been documented for this visit  ? ?There is currently no information documented on the homunculus. Go to the Rheumatology activity and complete the homunculus joint exam. ? ?Investigation: ?No additional findings. ? ?Imaging: ?No results found. ? ?Recent Labs: ?Lab Results  ?Component Value Date  ? WBC 5.4 09/14/2019  ? HGB 15.1 (H) 09/14/2019  ? PLT 176 09/14/2019  ? NA 141 09/14/2019  ? K 4.0 09/14/2019  ? CL 106 09/14/2019  ? CO2 22 09/14/2019  ? GLUCOSE 125 (H) 09/14/2019  ? BUN 19 09/14/2019  ? CREATININE 0.84 09/14/2019  ? BILITOT 0.8 09/14/2019  ? ALKPHOS 73 09/14/2019  ? AST 28 09/14/2019  ? ALT 25 09/14/2019  ? PROT 6.4 (L) 09/14/2019  ? ALBUMIN 4.1 09/14/2019  ? CALCIUM 9.6 09/14/2019  ? GFRAA >60 09/14/2019  ? ? ? ? ?Speciality Comments: No specialty comments available. ? ?Procedures:  ?No procedures performed ?Allergies: Doxycycline, Sulfa antibiotics, Tape, and Methocarbamol  ? ?Assessment / Plan:     ?Visit Diagnoses: No diagnosis found. ? ?Orders: ?No orders of the defined types were placed in this encounter. ? ?No orders of the defined types were placed in this encounter. ? ? ?Face-to-face time spent with patient was *** minutes. Greater than  50% of time was spent in counseling and coordination of care. ? ?Follow-Up Instructions: No follow-ups on file. ? ? ?Bo Merino, MD ? ?Note - This record has been created using [employer].  ?Chart creation errors have been sought, but may not always  ?have been located. Such creation errors do not reflect on  ?the standard of medical care. ?

## 2021-06-24 ENCOUNTER — Ambulatory Visit: Payer: Medicare (Managed Care) | Admitting: Rheumatology

## 2021-06-24 DIAGNOSIS — G4709 Other insomnia: Secondary | ICD-10-CM

## 2021-06-24 DIAGNOSIS — F3341 Major depressive disorder, recurrent, in partial remission: Secondary | ICD-10-CM

## 2021-06-24 DIAGNOSIS — E034 Atrophy of thyroid (acquired): Secondary | ICD-10-CM

## 2021-06-24 DIAGNOSIS — M19041 Primary osteoarthritis, right hand: Secondary | ICD-10-CM

## 2021-06-24 DIAGNOSIS — D329 Benign neoplasm of meninges, unspecified: Secondary | ICD-10-CM

## 2021-06-24 DIAGNOSIS — M2550 Pain in unspecified joint: Secondary | ICD-10-CM

## 2021-06-24 DIAGNOSIS — M19071 Primary osteoarthritis, right ankle and foot: Secondary | ICD-10-CM

## 2021-06-24 DIAGNOSIS — Z803 Family history of malignant neoplasm of breast: Secondary | ICD-10-CM

## 2021-06-24 DIAGNOSIS — D508 Other iron deficiency anemias: Secondary | ICD-10-CM

## 2021-06-24 DIAGNOSIS — M797 Fibromyalgia: Secondary | ICD-10-CM

## 2021-06-24 DIAGNOSIS — E559 Vitamin D deficiency, unspecified: Secondary | ICD-10-CM

## 2021-06-24 DIAGNOSIS — Z8669 Personal history of other diseases of the nervous system and sense organs: Secondary | ICD-10-CM

## 2021-06-24 DIAGNOSIS — M5134 Other intervertebral disc degeneration, thoracic region: Secondary | ICD-10-CM

## 2021-06-24 DIAGNOSIS — M8589 Other specified disorders of bone density and structure, multiple sites: Secondary | ICD-10-CM

## 2021-06-24 DIAGNOSIS — M722 Plantar fascial fibromatosis: Secondary | ICD-10-CM

## 2021-06-24 DIAGNOSIS — M5136 Other intervertebral disc degeneration, lumbar region: Secondary | ICD-10-CM

## 2021-06-24 DIAGNOSIS — L719 Rosacea, unspecified: Secondary | ICD-10-CM

## 2021-06-24 DIAGNOSIS — G4752 REM sleep behavior disorder: Secondary | ICD-10-CM

## 2021-06-24 DIAGNOSIS — M4316 Spondylolisthesis, lumbar region: Secondary | ICD-10-CM

## 2021-06-24 DIAGNOSIS — M5030 Other cervical disc degeneration, unspecified cervical region: Secondary | ICD-10-CM

## 2021-06-28 ENCOUNTER — Ambulatory Visit: Payer: Medicare Other | Admitting: Rheumatology

## 2021-06-28 ENCOUNTER — Other Ambulatory Visit: Payer: Self-pay

## 2021-06-28 ENCOUNTER — Encounter: Payer: Self-pay | Admitting: Rheumatology

## 2021-06-28 VITALS — BP 109/78 | HR 66 | Ht 59.0 in | Wt 156.4 lb

## 2021-06-28 DIAGNOSIS — E559 Vitamin D deficiency, unspecified: Secondary | ICD-10-CM

## 2021-06-28 DIAGNOSIS — M722 Plantar fascial fibromatosis: Secondary | ICD-10-CM

## 2021-06-28 DIAGNOSIS — M85852 Other specified disorders of bone density and structure, left thigh: Secondary | ICD-10-CM

## 2021-06-28 DIAGNOSIS — M19071 Primary osteoarthritis, right ankle and foot: Secondary | ICD-10-CM

## 2021-06-28 DIAGNOSIS — M19042 Primary osteoarthritis, left hand: Secondary | ICD-10-CM

## 2021-06-28 DIAGNOSIS — G4752 REM sleep behavior disorder: Secondary | ICD-10-CM

## 2021-06-28 DIAGNOSIS — D329 Benign neoplasm of meninges, unspecified: Secondary | ICD-10-CM

## 2021-06-28 DIAGNOSIS — M4316 Spondylolisthesis, lumbar region: Secondary | ICD-10-CM

## 2021-06-28 DIAGNOSIS — G4709 Other insomnia: Secondary | ICD-10-CM

## 2021-06-28 DIAGNOSIS — M797 Fibromyalgia: Secondary | ICD-10-CM

## 2021-06-28 DIAGNOSIS — M5136 Other intervertebral disc degeneration, lumbar region: Secondary | ICD-10-CM

## 2021-06-28 DIAGNOSIS — Z8669 Personal history of other diseases of the nervous system and sense organs: Secondary | ICD-10-CM

## 2021-06-28 DIAGNOSIS — M85851 Other specified disorders of bone density and structure, right thigh: Secondary | ICD-10-CM

## 2021-06-28 DIAGNOSIS — L719 Rosacea, unspecified: Secondary | ICD-10-CM

## 2021-06-28 DIAGNOSIS — M5134 Other intervertebral disc degeneration, thoracic region: Secondary | ICD-10-CM

## 2021-06-28 DIAGNOSIS — M19072 Primary osteoarthritis, left ankle and foot: Secondary | ICD-10-CM

## 2021-06-28 DIAGNOSIS — Z803 Family history of malignant neoplasm of breast: Secondary | ICD-10-CM

## 2021-06-28 DIAGNOSIS — F3341 Major depressive disorder, recurrent, in partial remission: Secondary | ICD-10-CM

## 2021-06-28 DIAGNOSIS — M458 Ankylosing spondylitis sacral and sacrococcygeal region: Secondary | ICD-10-CM

## 2021-06-28 DIAGNOSIS — E034 Atrophy of thyroid (acquired): Secondary | ICD-10-CM

## 2021-06-28 DIAGNOSIS — D508 Other iron deficiency anemias: Secondary | ICD-10-CM

## 2021-06-28 DIAGNOSIS — M19041 Primary osteoarthritis, right hand: Secondary | ICD-10-CM

## 2021-06-28 DIAGNOSIS — M5030 Other cervical disc degeneration, unspecified cervical region: Secondary | ICD-10-CM

## 2021-06-28 NOTE — Progress Notes (Signed)
? ?Office Visit Note ? ?Patient: Emily Steele             ?Date of Birth: November 02, 1955           ?MRN: [id number]             ?PCP: Nickola Major, MD ?Referring: Nickola Major, MD ?Visit Date: 06/28/2021 ?Occupation: '@GUAROCC'$ @ ? ?Subjective:  ?Lower back pain ? ?History of Present Illness: Emily Steele is a 66 y.o. female with history of degenerative disc disease and osteoarthritis and possible ankylosing spondylitis.  She was seen recently on May 18, 2021 due to radiographic findings of SI joint fusion.  She had no synovitis on her examination.  She also had been seeing Dr. Sharol Given for Planter fasciitis.  Patient states that she has not had a episode of Planter fasciitis or Achilles tendinitis in a long time.  She had longstanding history of degenerative disc disease involving cervical thoracic and lumbar spine.  She continues to have neck pain and intermittent radiculopathy.  She is also had lumbar spine fusion by Dr. Saintclair Halsted in the past.  He continues to have lower back pain.  She denies any SI joint pain.  She has known history of fibromyalgia with generalized pain and discomfort.   ? ?Activities of Daily Living:  ?Patient reports morning stiffness for 20 minutes.   ?Patient Reports nocturnal pain.  ?Difficulty dressing/grooming: Denies ?Difficulty climbing stairs: Denies ?Difficulty getting out of chair: Denies ?Difficulty using hands for taps, buttons, cutlery, and/or writing: Denies ? ?Review of Systems  ?Constitutional:  Positive for fatigue.  ?HENT:  Positive for mouth sores and mouth dryness. Negative for nose dryness.   ?Eyes:  Negative for pain, itching and dryness.  ?Respiratory:  Negative for shortness of breath and difficulty breathing.   ?Cardiovascular:  Negative for chest pain and palpitations.  ?Gastrointestinal:  Positive for constipation. Negative for blood in stool and diarrhea.  ?Endocrine: Negative for increased urination.  ?Genitourinary:  Negative for difficulty urinating.   ?Musculoskeletal:  Positive for myalgias, morning stiffness, muscle tenderness and myalgias. Negative for joint pain, joint pain and joint swelling.  ?Skin:  Positive for rash and redness. Negative for color change.  ?Allergic/Immunologic: Negative for susceptible to infections.  ?Neurological:  Positive for memory loss and weakness. Negative for dizziness, numbness and headaches.  ?Hematological:  Positive for bruising/bleeding tendency.  ?Psychiatric/Behavioral:  Negative for confusion.   ? ?PMFS History:  ?Patient Active Problem List  ? Diagnosis Date Noted  ? Degenerative disc disease, lumbar 10/20/2015  ? History of encephalopathy 10/20/2015  ? Family history of breast cancer in first degree relative 04/13/2015  ? Anemia, iron deficiency 04/13/2015  ? Vitamin D deficiency 04/13/2015  ? Rosacea, acne 03/16/2015  ? Meningioma ([place]) 01/07/2015  ? Cognitive impairment 01/07/2015  ? Spondylolisthesis at L4-L5 level 08/18/2013  ? Herpetic encephalitis 08/17/2012  ? H/O vitamin D deficiency 08/13/2011  ? Major Depressive Disorder 05/17/2011  ? Fibromyalgia 05/17/2011  ? Hypothyroidism 05/17/2011  ? Sleep behavior disorder, REM 05/17/2011  ? Osteopenia 05/17/2011  ? Obesity 05/17/2011  ? Plantar fasciitis 05/17/2011  ?  ?Past Medical History:  ?Diagnosis Date  ? Allergy   ? Anemia   ? Anxiety   ? Arthritis   ? Brain tumor ([place])   ? right temporal, not ca  ? Constipation   ? DDD (degenerative disc disease)   ? Depression   ? Eczema   ? Fibromyalgia   ? Fibromyalgia   ? Hearing loss   ?  1 ear - mild  ? Herpes simplex   ? Hx of migraine headaches   ? Hypertension   ? not on medications  ? Hypothyroid   ? Left wrist fracture   ? Obesity   ? Osteoarthritis   ? Osteopenia   ? Osteoporosis   ? Overactive bladder   ? Plantar fasciitis   ? Pneumonia 08/2012  ? Rosacea   ? Sleep behavior disorder, REM   ? Toenail fungus   ? UTI (urinary tract infection)   ?  ?Family History  ?Problem Relation Age of Onset  ? Asthma Mother   ?  Cancer Mother 61  ?     lung cancer  ? Hypertension Mother   ? Cancer Sister   ?     Breast cancer  ? Drug abuse Brother   ? Cancer Brother   ? ?Past Surgical History:  ?Procedure Laterality Date  ? ABDOMINAL HYSTERECTOMY    ? uterine fibroid; ovaries intact. DUB.  ? HAND SURGERY Bilateral   ? ligimant transfere to thumbs  ? LAMINECTOMY  08/18/2013  ? L 4 L5    DR CRAM  ? MAXIMUM ACCESS (MAS)POSTERIOR LUMBAR INTERBODY FUSION (PLIF) 1 LEVEL N/A 08/18/2013  ? Procedure: FOR MAXIMUM ACCESS (MAS) POSTERIOR LUMBAR INTERBODY FUSION (PLIF) 1 LEVEL;  Surgeon: Elaina Hoops, MD;  Location: [facility];  Service: Neurosurgery;  Laterality: N/A;  FOR MAXIMUM ACCESS (MAS) POSTERIOR LUMBAR INTERBODY FUSION (PLIF) 1 LEVEL L4-5  ? OPEN REDUCTION INTERNAL FIXATION (ORIF) DISTAL RADIAL FRACTURE Left 04/07/2019  ? Procedure: OPEN REDUCTION INTERNAL FIXATION (ORIF) DISTAL RADIAL FRACTURE;  Surgeon: Iran Planas, MD;  Location: [place];  Service: Orthopedics;  Laterality: Left;  with IV sedation  ? SPINE SURGERY    ? Lamincetomy and Disectomy L5- S1  ? TONSILLECTOMY AND ADENOIDECTOMY    ? ?Social History  ? ?Social History Narrative  ? Marital status: single; not dating.    ?    Children: none  ?    Lives: Patient lives at home alone.  Originally from [place]; moved to [place] in 2000.  ?    Employment: disability in 2011 due to DDD lumbar.  Education officer, [employer].  ?   Tobacco: none  ?    Alcohol: none  ?    Exercise:   ?    ADLs: independent with ADLs in 2019; drives.  ?    Advanced Directives: FULL CODE no prolonged measures.    ?   ?   ? Caffeine Use: 3-4 cups weekly  ? Single. Education: [employer].  Exercises 4x's a week.  ? ?Immunization History  ?Administered Date(s) Administered  ? Influenza Split 02/29/2012  ? Influenza,inj,Quad PF,6+ Mos 02/07/2013, 04/08/2014, 03/12/2015  ? Influenza-Unspecified 03/17/2016, 03/17/2017  ? PFIZER(Purple Top)SARS-COV-2 Vaccination 07/24/2019, 08/18/2019  ? Engineer, production 68yr & up 04/06/2021  ? Td 04/17/2010  ? Zoster Recombinat (Shingrix) 10/22/2016, 12/18/2016  ?  ? ?Objective: ?Vital Signs: BP 109/78 (BP Location: Left Arm, Patient Position: Sitting, Cuff Size: Normal)   Pulse 66   Ht '4\' 11"'$  (1.499 m)   Wt 156 lb 6.4 oz (70.9 kg)   BMI 31.59 kg/m?   ? ?Physical Exam ?Vitals and nursing note reviewed.  ?Constitutional:   ?   Appearance: She is well-developed.  ?HENT:  ?   Head: Normocephalic and atraumatic.  ?Eyes:  ?   Conjunctiva/sclera: Conjunctivae normal.  ?Cardiovascular:  ?   Rate and Rhythm: Normal rate  and regular rhythm.  ?   Heart sounds: Normal heart sounds.  ?Pulmonary:  ?   Effort: Pulmonary effort is normal.  ?   Breath sounds: Normal breath sounds.  ?Abdominal:  ?   General: Bowel sounds are normal.  ?   Palpations: Abdomen is soft.  ?Musculoskeletal:  ?   Cervical back: Normal range of motion.  ?Lymphadenopathy:  ?   Cervical: No cervical adenopathy.  ?Skin: ?   General: Skin is warm and dry.  ?   Capillary Refill: Capillary refill takes less than 2 seconds.  ?Neurological:  ?   Mental Status: She is alert and oriented to person, place, and time.  ?Psychiatric:     ?   Behavior: Behavior normal.  ?  ? ?Musculoskeletal Exam: She had very limited lateral rotation and flexion of her cervical spine.  She had limited mobility in the her thoracic and lumbar spine.  She had no tenderness over SI joints.  Shoulder joints, elbow joints, wrist joints, MCPs PIPs and DIPs with good range of motion with no synovitis.  Hip joints and knee joints with good range of motion.  She had no tenderness over ankles or MTPs.  There was no evidence of Achilles tendinitis or plantar fasciitis. ? ?CDAI Exam: ?CDAI Score: -- ?Patient Global: --; Provider Global: -- ?Swollen: --; Tender: -- ?Joint Exam 06/28/2021  ? ?No joint exam has been documented for this visit  ? ?There is currently no information documented on the homunculus. Go to the Rheumatology activity and  complete the homunculus joint exam. ? ?Investigation: ?No additional findings. ? ?Imaging: ?No results found. ? ?Recent Labs: ?Lab Results  ?Component Value Date  ? WBC 5.4 09/14/2019  ? HGB 15.1 (H) 09/14/2019  ? PLT 1

## 2021-12-06 ENCOUNTER — Other Ambulatory Visit: Payer: Self-pay | Admitting: Neurosurgery

## 2021-12-06 DIAGNOSIS — D320 Benign neoplasm of cerebral meninges: Secondary | ICD-10-CM

## 2021-12-21 ENCOUNTER — Other Ambulatory Visit: Payer: Medicare Other

## 2022-01-01 ENCOUNTER — Other Ambulatory Visit: Payer: Medicare Other

## 2022-01-04 ENCOUNTER — Other Ambulatory Visit: Payer: Medicare Other
